# Patient Record
Sex: Female | Born: 1945
Health system: Southern US, Community
[De-identification: ages and names within clinical notes are randomized; demographics above are authoritative.]

## PROBLEM LIST (undated history)

## (undated) ENCOUNTER — Emergency Department: Admission: EM | Discharge status: Against Medical Advice | Payer: Self-pay

## (undated) DIAGNOSIS — R030 Elevated blood-pressure reading, without diagnosis of hypertension: Secondary | ICD-10-CM

## (undated) DIAGNOSIS — Z862 Personal history of diseases of the blood and blood-forming organs and certain disorders involving the immune mechanism: Secondary | ICD-10-CM

## (undated) DIAGNOSIS — Z800 Family history of malignant neoplasm of digestive organs: Secondary | ICD-10-CM

## (undated) DIAGNOSIS — E039 Hypothyroidism, unspecified: Secondary | ICD-10-CM

## (undated) DIAGNOSIS — H9190 Unspecified hearing loss, unspecified ear: Secondary | ICD-10-CM

## (undated) DIAGNOSIS — K5790 Diverticulosis of intestine, part unspecified, without perforation or abscess without bleeding: Secondary | ICD-10-CM

## (undated) DIAGNOSIS — E7800 Pure hypercholesterolemia, unspecified: Secondary | ICD-10-CM

## (undated) DIAGNOSIS — E785 Hyperlipidemia, unspecified: Secondary | ICD-10-CM

## (undated) DIAGNOSIS — Z8719 Personal history of other diseases of the digestive system: Secondary | ICD-10-CM

## (undated) DIAGNOSIS — Z1371 Encounter for nonprocreative screening for genetic disease carrier status: Secondary | ICD-10-CM

## (undated) DIAGNOSIS — Z803 Family history of malignant neoplasm of breast: Secondary | ICD-10-CM

## (undated) DIAGNOSIS — Z808 Family history of malignant neoplasm of other organs or systems: Secondary | ICD-10-CM

## (undated) HISTORY — DX: Family history of malignant neoplasm of breast: Z80.3

## (undated) HISTORY — PX: CHOLECYSTECTOMY: SHX55

## (undated) HISTORY — DX: Family history of malignant neoplasm of digestive organs: Z80.0

## (undated) HISTORY — PX: APPENDECTOMY: SHX54

## (undated) HISTORY — PX: FOOT SURGERY: SHX648

## (undated) HISTORY — DX: Family history of malignant neoplasm of other organs or systems: Z80.8

## (undated) HISTORY — PX: NASAL SEPTUM SURGERY: SHX37

## (undated) HISTORY — DX: Hypothyroidism, unspecified: E03.9

## (undated) HISTORY — PX: TONSILLECTOMY: SUR1361

## (undated) HISTORY — PX: BUNIONECTOMY: SHX129

## (undated) HISTORY — DX: Hyperlipidemia, unspecified: E78.5

## (undated) HISTORY — PX: CATARACT EXTRACTION: SUR2

---

## 1998-07-07 ENCOUNTER — Other Ambulatory Visit: Admission: RE | Admit: 1998-07-07 | Discharge: 1998-07-07 | Payer: Self-pay | Admitting: Obstetrics and Gynecology

## 1999-09-27 ENCOUNTER — Other Ambulatory Visit: Admission: RE | Admit: 1999-09-27 | Discharge: 1999-09-27 | Payer: Self-pay | Admitting: Obstetrics and Gynecology

## 2000-03-27 ENCOUNTER — Encounter: Payer: Self-pay | Admitting: Family Medicine

## 2000-03-27 ENCOUNTER — Ambulatory Visit (HOSPITAL_COMMUNITY): Admission: RE | Admit: 2000-03-27 | Discharge: 2000-03-27 | Payer: Self-pay | Admitting: Family Medicine

## 2000-06-11 ENCOUNTER — Encounter: Admission: RE | Admit: 2000-06-11 | Discharge: 2000-06-11 | Payer: Self-pay | Admitting: Family Medicine

## 2000-06-11 ENCOUNTER — Encounter: Payer: Self-pay | Admitting: Family Medicine

## 2001-06-03 ENCOUNTER — Other Ambulatory Visit: Admission: RE | Admit: 2001-06-03 | Discharge: 2001-06-03 | Payer: Self-pay | Admitting: Family Medicine

## 2001-06-20 ENCOUNTER — Encounter: Payer: Self-pay | Admitting: Family Medicine

## 2001-06-20 ENCOUNTER — Encounter: Admission: RE | Admit: 2001-06-20 | Discharge: 2001-06-20 | Payer: Self-pay | Admitting: Family Medicine

## 2002-06-09 ENCOUNTER — Other Ambulatory Visit: Admission: RE | Admit: 2002-06-09 | Discharge: 2002-06-09 | Payer: Self-pay | Admitting: Family Medicine

## 2002-06-23 ENCOUNTER — Encounter: Admission: RE | Admit: 2002-06-23 | Discharge: 2002-06-23 | Payer: Self-pay | Admitting: Family Medicine

## 2002-06-23 ENCOUNTER — Encounter: Payer: Self-pay | Admitting: Family Medicine

## 2003-06-24 ENCOUNTER — Encounter: Admission: RE | Admit: 2003-06-24 | Discharge: 2003-06-24 | Payer: Self-pay | Admitting: Family Medicine

## 2003-06-25 ENCOUNTER — Other Ambulatory Visit: Admission: RE | Admit: 2003-06-25 | Discharge: 2003-06-25 | Payer: Self-pay | Admitting: Family Medicine

## 2003-11-03 ENCOUNTER — Encounter: Admission: RE | Admit: 2003-11-03 | Discharge: 2003-11-03 | Payer: Self-pay | Admitting: Family Medicine

## 2004-01-18 ENCOUNTER — Encounter (INDEPENDENT_AMBULATORY_CARE_PROVIDER_SITE_OTHER): Payer: Self-pay | Admitting: Specialist

## 2004-01-18 ENCOUNTER — Ambulatory Visit (HOSPITAL_COMMUNITY): Admission: RE | Admit: 2004-01-18 | Discharge: 2004-01-18 | Payer: Self-pay | Admitting: *Deleted

## 2005-02-10 ENCOUNTER — Ambulatory Visit: Payer: Self-pay | Admitting: Internal Medicine

## 2005-03-07 ENCOUNTER — Ambulatory Visit: Payer: Self-pay

## 2005-03-15 ENCOUNTER — Encounter: Admission: RE | Admit: 2005-03-15 | Discharge: 2005-06-13 | Payer: Self-pay | Admitting: Internal Medicine

## 2005-04-03 ENCOUNTER — Encounter: Admission: RE | Admit: 2005-04-03 | Discharge: 2005-04-03 | Payer: Self-pay | Admitting: Family Medicine

## 2005-06-22 ENCOUNTER — Other Ambulatory Visit: Admission: RE | Admit: 2005-06-22 | Discharge: 2005-06-22 | Payer: Self-pay | Admitting: Family Medicine

## 2006-04-26 ENCOUNTER — Encounter: Admission: RE | Admit: 2006-04-26 | Discharge: 2006-04-26 | Payer: Self-pay | Admitting: Family Medicine

## 2008-06-16 ENCOUNTER — Encounter: Admission: RE | Admit: 2008-06-16 | Discharge: 2008-06-16 | Payer: Self-pay | Admitting: Family Medicine

## 2008-06-18 ENCOUNTER — Other Ambulatory Visit: Admission: RE | Admit: 2008-06-18 | Discharge: 2008-06-18 | Payer: Self-pay | Admitting: Family Medicine

## 2009-11-16 ENCOUNTER — Encounter: Admission: RE | Admit: 2009-11-16 | Discharge: 2009-11-16 | Payer: Self-pay | Admitting: Family Medicine

## 2009-11-26 ENCOUNTER — Encounter: Admission: RE | Admit: 2009-11-26 | Discharge: 2009-11-26 | Payer: Self-pay | Admitting: Family Medicine

## 2011-08-07 ENCOUNTER — Other Ambulatory Visit: Payer: Self-pay | Admitting: Family Medicine

## 2011-08-07 DIAGNOSIS — N644 Mastodynia: Secondary | ICD-10-CM

## 2011-08-09 ENCOUNTER — Ambulatory Visit
Admission: RE | Admit: 2011-08-09 | Discharge: 2011-08-09 | Disposition: A | Discharge status: Home / Self Care | Payer: 59 | Source: Ambulatory Visit | Attending: Family Medicine | Admitting: Family Medicine

## 2011-08-09 DIAGNOSIS — N644 Mastodynia: Secondary | ICD-10-CM

## 2011-11-19 ENCOUNTER — Emergency Department (HOSPITAL_BASED_OUTPATIENT_CLINIC_OR_DEPARTMENT_OTHER): Payer: 59

## 2011-11-19 ENCOUNTER — Encounter (HOSPITAL_BASED_OUTPATIENT_CLINIC_OR_DEPARTMENT_OTHER): Payer: Self-pay | Admitting: Emergency Medicine

## 2011-11-19 ENCOUNTER — Emergency Department (HOSPITAL_BASED_OUTPATIENT_CLINIC_OR_DEPARTMENT_OTHER)
Admission: EM | Admit: 2011-11-19 | Discharge: 2011-11-19 | Disposition: A | Discharge status: Home / Self Care | Payer: 59 | Attending: Emergency Medicine | Admitting: Emergency Medicine

## 2011-11-19 DIAGNOSIS — E039 Hypothyroidism, unspecified: Secondary | ICD-10-CM | POA: Insufficient documentation

## 2011-11-19 DIAGNOSIS — R911 Solitary pulmonary nodule: Secondary | ICD-10-CM | POA: Insufficient documentation

## 2011-11-19 DIAGNOSIS — K5732 Diverticulitis of large intestine without perforation or abscess without bleeding: Secondary | ICD-10-CM | POA: Insufficient documentation

## 2011-11-19 DIAGNOSIS — K5792 Diverticulitis of intestine, part unspecified, without perforation or abscess without bleeding: Secondary | ICD-10-CM

## 2011-11-19 DIAGNOSIS — E7800 Pure hypercholesterolemia, unspecified: Secondary | ICD-10-CM | POA: Insufficient documentation

## 2011-11-19 DIAGNOSIS — R110 Nausea: Secondary | ICD-10-CM | POA: Insufficient documentation

## 2011-11-19 HISTORY — DX: Hypothyroidism, unspecified: E03.9

## 2011-11-19 HISTORY — DX: Pure hypercholesterolemia, unspecified: E78.00

## 2011-11-19 HISTORY — DX: Diverticulosis of intestine, part unspecified, without perforation or abscess without bleeding: K57.90

## 2011-11-19 LAB — URINALYSIS, ROUTINE W REFLEX MICROSCOPIC
Bilirubin Urine: NEGATIVE
Glucose, UA: NEGATIVE mg/dL
Hgb urine dipstick: NEGATIVE
Ketones, ur: NEGATIVE mg/dL
Nitrite: NEGATIVE
Protein, ur: NEGATIVE mg/dL
Specific Gravity, Urine: 1.01 (ref 1.005–1.030)
Urobilinogen, UA: 0.2 mg/dL (ref 0.0–1.0)
pH: 8 (ref 5.0–8.0)

## 2011-11-19 LAB — CBC WITH DIFFERENTIAL/PLATELET
Basophils Absolute: 0 10*3/uL (ref 0.0–0.1)
Basophils Relative: 0 % (ref 0–1)
Eosinophils Absolute: 0 10*3/uL (ref 0.0–0.7)
Eosinophils Relative: 1 % (ref 0–5)
HCT: 37.4 % (ref 36.0–46.0)
Hemoglobin: 12.6 g/dL (ref 12.0–15.0)
Lymphocytes Relative: 13 % (ref 12–46)
Lymphs Abs: 1.1 10*3/uL (ref 0.7–4.0)
MCH: 31.1 pg (ref 26.0–34.0)
MCHC: 33.7 g/dL (ref 30.0–36.0)
MCV: 92.3 fL (ref 78.0–100.0)
Monocytes Absolute: 0.5 10*3/uL (ref 0.1–1.0)
Monocytes Relative: 5 % (ref 3–12)
Neutro Abs: 6.8 10*3/uL (ref 1.7–7.7)
Neutrophils Relative %: 81 % — ABNORMAL HIGH (ref 43–77)
Platelets: 185 10*3/uL (ref 150–400)
RBC: 4.05 MIL/uL (ref 3.87–5.11)
RDW: 14 % (ref 11.5–15.5)
WBC: 8.5 10*3/uL (ref 4.0–10.5)

## 2011-11-19 LAB — COMPREHENSIVE METABOLIC PANEL
ALT: 24 U/L (ref 0–35)
AST: 21 U/L (ref 0–37)
Albumin: 4 g/dL (ref 3.5–5.2)
Alkaline Phosphatase: 81 U/L (ref 39–117)
BUN: 12 mg/dL (ref 6–23)
CO2: 26 mEq/L (ref 19–32)
Calcium: 9.4 mg/dL (ref 8.4–10.5)
Chloride: 101 mEq/L (ref 96–112)
Creatinine, Ser: 0.9 mg/dL (ref 0.50–1.10)
GFR calc Af Amer: 77 mL/min — ABNORMAL LOW (ref >90)
GFR calc non Af Amer: 66 mL/min — ABNORMAL LOW (ref >90)
Glucose, Bld: 124 mg/dL — ABNORMAL HIGH (ref 70–99)
Potassium: 3.9 mEq/L (ref 3.5–5.1)
Sodium: 138 mEq/L (ref 135–145)
Total Bilirubin: 1 mg/dL (ref 0.3–1.2)
Total Protein: 7.4 g/dL (ref 6.0–8.3)

## 2011-11-19 LAB — URINE MICROSCOPIC-ADD ON

## 2011-11-19 MED ORDER — ONDANSETRON HCL 4 MG PO TABS: 4.0000 mg | ORAL_TABLET | Freq: Four times a day (QID) | ORAL | Status: DC

## 2011-11-19 MED ORDER — OXYCODONE-ACETAMINOPHEN 5-325 MG PO TABS
ORAL_TABLET | ORAL | Status: AC
  Administered 2011-11-19: 2
  Filled 2011-11-19: qty 2

## 2011-11-19 MED ORDER — OXYCODONE-ACETAMINOPHEN 5-325 MG PO TABS: 2.0000 | ORAL_TABLET | ORAL | Status: DC | PRN

## 2011-11-19 MED ORDER — IOHEXOL 300 MG/ML  SOLN
100.0000 mL | Freq: Once | INTRAMUSCULAR | Status: AC | PRN
  Administered 2011-11-19: 100 mL via INTRAVENOUS

## 2011-11-19 MED ORDER — IOHEXOL 300 MG/ML  SOLN: 25.0000 mL | INTRAMUSCULAR | Status: AC

## 2011-11-19 MED ORDER — SODIUM CHLORIDE 0.9 % IV BOLUS (SEPSIS)
500.0000 mL | Freq: Once | INTRAVENOUS | Status: AC
  Administered 2011-11-19: 500 mL via INTRAVENOUS

## 2011-11-19 MED ORDER — METRONIDAZOLE 500 MG PO TABS
500.0000 mg | ORAL_TABLET | Freq: Once | ORAL | Status: AC
  Administered 2011-11-19: 500 mg via ORAL
  Filled 2011-11-19: qty 1

## 2011-11-19 MED ORDER — ONDANSETRON HCL 8 MG PO TABS
ORAL_TABLET | ORAL | Status: AC
  Administered 2011-11-19: 8 mg
  Filled 2011-11-19: qty 1

## 2011-11-19 MED ORDER — CIPROFLOXACIN HCL 500 MG PO TABS: 500.0000 mg | ORAL_TABLET | Freq: Two times a day (BID) | ORAL | Status: DC

## 2011-11-19 MED ORDER — CIPROFLOXACIN HCL 500 MG PO TABS
500.0000 mg | ORAL_TABLET | Freq: Once | ORAL | Status: AC
  Administered 2011-11-19: 500 mg via ORAL
  Filled 2011-11-19: qty 1

## 2011-11-19 MED ORDER — ONDANSETRON HCL 4 MG/2ML IJ SOLN: 4.0000 mg | Freq: Once | INTRAMUSCULAR | Status: DC

## 2011-11-19 MED ORDER — MORPHINE SULFATE 4 MG/ML IJ SOLN: 4.0000 mg | Freq: Once | INTRAMUSCULAR | Status: DC

## 2011-11-19 MED ORDER — METRONIDAZOLE 500 MG PO TABS: 500.0000 mg | ORAL_TABLET | Freq: Two times a day (BID) | ORAL | Status: DC

## 2011-11-19 NOTE — ED Notes (Signed)
Generalized abdominal pain for three days, worse today.  Some nausea.  Zofran IM given per EMS.

## 2011-11-19 NOTE — ED Provider Notes (Signed)
History   This chart was scribed for Elizabeth Bucco, MD by Thad Ranger, ED Scribe. This patient was seen in room MH06/MH06 and the patient's care was started at 6:27 PM.   CSN: [id number]  Arrival date & time 11/19/11  1752   First MD Initiated Contact with Patient 11/19/11 1827      Chief Complaint  Patient presents with  . Abdominal Pain    The history is provided by the patient. No language interpreter was used.    Elizabeth Maldonado is a 66 y.o. female with a history of Diverticulosis, who was brought by EMS to the Emergency Department complaining of waxing and waning, sudden, sharp episodes of generalized abdominal pain onset 3 days ago. She reports that there is associated constant soreness, and nausea. She was given Zofran IM by EMS prior to arrival to ED and that seemed to alleviate the nausea. Pain doesn't radiate to any other parts of her body. Patient reports that she had small amount of diarrhea yesterday and a trace of it today. She denies fever, vomiting, dizziness, blood in stool or urine, and any other associated symptoms.     Past Medical History  Diagnosis Date  . Diverticulosis   . Hypercholesteremia   . Hypothyroid     Past Surgical History  Procedure Date  . Cholecystectomy   . Appendectomy   . Cesarean section   . Foot surgery   . Bunionectomy     No family history on file.  History  Substance Use Topics  . Smoking status: Not on file  . Smokeless tobacco: Not on file  . Alcohol Use:     No OB history provided.   Review of Systems  Constitutional: Negative for fever, chills, diaphoresis and fatigue.  HENT: Negative for congestion, rhinorrhea and sneezing.   Eyes: Negative.   Respiratory: Negative for cough, chest tightness and shortness of breath.   Cardiovascular: Negative for chest pain and leg swelling.  Gastrointestinal: Negative for nausea, vomiting, abdominal pain, diarrhea and blood in stool.  Genitourinary: Negative for frequency,  hematuria, flank pain and difficulty urinating.  Musculoskeletal: Negative for back pain and arthralgias.  Skin: Negative for rash.  Neurological: Negative for dizziness, speech difficulty, weakness, numbness and headaches.    Allergies  Crestor; Lipitor; and Vytorin  Home Medications   Current Outpatient Rx  Name  Route  Sig  Dispense  Refill  . EZETIMIBE 10 MG PO TABS   Oral   Take 10 mg by mouth daily.         Marland Kitchen LEVOTHYROXINE SODIUM 75 MCG PO TABS   Oral   Take 75 mcg by mouth daily.         Marland Kitchen NIACIN ER (ANTIHYPERLIPIDEMIC) 1000 MG PO TBCR   Oral   Take 1,000 mg by mouth at bedtime.         Marland Kitchen CIPROFLOXACIN HCL 500 MG PO TABS   Oral   Take 1 tablet (500 mg total) by mouth 2 (two) times daily.   20 tablet   0   . METRONIDAZOLE 500 MG PO TABS   Oral   Take 1 tablet (500 mg total) by mouth 2 (two) times daily.   14 tablet   0   . ONDANSETRON HCL 4 MG PO TABS   Oral   Take 1 tablet (4 mg total) by mouth every 6 (six) hours.   12 tablet   0   . OXYCODONE-ACETAMINOPHEN 5-325 MG PO TABS   Oral  Take 2 tablets by mouth every 4 (four) hours as needed for pain.   6 tablet   0     BP 160/67  Pulse 88  Temp 98.4 F (36.9 C) (Oral)  Resp 18  SpO2 98%  Physical Exam  Constitutional: She is oriented to person, place, and time. She appears well-developed and well-nourished.  HENT:  Head: Normocephalic and atraumatic.  Eyes: Pupils are equal, round, and reactive to light.  Neck: Normal range of motion. Neck supple.  Cardiovascular: Normal rate, regular rhythm and normal heart sounds.   Pulmonary/Chest: Effort normal and breath sounds normal. No respiratory distress. She has no wheezes. She has no rales. She exhibits no tenderness.  Abdominal: Soft. Bowel sounds are normal. There is no tenderness. There is no rebound and no guarding.       Moderate tenderness across the lower abdomin.   Musculoskeletal: Normal range of motion. She exhibits no edema.    Lymphadenopathy:    She has no cervical adenopathy.  Neurological: She is alert and oriented to person, place, and time.  Skin: Skin is warm and dry. No rash noted.  Psychiatric: She has a normal mood and affect.    ED Course  Procedures (including critical care time)  DIAGNOSTIC STUDIES: Oxygen Saturation is 98% on room air, adequate by my interpretation.    COORDINATION OF CARE: 6:37 PM Discussed treatment plan with pt at bedside and pt agreed to plan.  Results for orders placed during the hospital encounter of 11/19/11  CBC WITH DIFFERENTIAL      Component Value Range   WBC 8.5  4.0 - 10.5 K/uL   RBC 4.05  3.87 - 5.11 MIL/uL   Hemoglobin 12.6  12.0 - 15.0 g/dL   HCT 54.0  98.1 - 19.1 %   MCV 92.3  78.0 - 100.0 fL   MCH 31.1  26.0 - 34.0 pg   MCHC 33.7  30.0 - 36.0 g/dL   RDW 47.8  29.5 - 62.1 %   Platelets 185  150 - 400 K/uL   Neutrophils Relative 81 (*) 43 - 77 %   Neutro Abs 6.8  1.7 - 7.7 K/uL   Lymphocytes Relative 13  12 - 46 %   Lymphs Abs 1.1  0.7 - 4.0 K/uL   Monocytes Relative 5  3 - 12 %   Monocytes Absolute 0.5  0.1 - 1.0 K/uL   Eosinophils Relative 1  0 - 5 %   Eosinophils Absolute 0.0  0.0 - 0.7 K/uL   Basophils Relative 0  0 - 1 %   Basophils Absolute 0.0  0.0 - 0.1 K/uL  COMPREHENSIVE METABOLIC PANEL      Component Value Range   Sodium 138  135 - 145 mEq/L   Potassium 3.9  3.5 - 5.1 mEq/L   Chloride 101  96 - 112 mEq/L   CO2 26  19 - 32 mEq/L   Glucose, Bld 124 (*) 70 - 99 mg/dL   BUN 12  6 - 23 mg/dL   Creatinine, Ser 3.08  0.50 - 1.10 mg/dL   Calcium 9.4  8.4 - 65.7 mg/dL   Total Protein 7.4  6.0 - 8.3 g/dL   Albumin 4.0  3.5 - 5.2 g/dL   AST 21  0 - 37 U/L   ALT 24  0 - 35 U/L   Alkaline Phosphatase 81  39 - 117 U/L   Total Bilirubin 1.0  0.3 - 1.2 mg/dL   GFR calc non  Af Amer 66 (*) >90 mL/min   GFR calc Af Amer 77 (*) >90 mL/min  URINALYSIS, ROUTINE W REFLEX MICROSCOPIC      Component Value Range   Color, Urine YELLOW  YELLOW    APPearance CLEAR  CLEAR   Specific Gravity, Urine 1.010  1.005 - 1.030   pH 8.0  5.0 - 8.0   Glucose, UA NEGATIVE  NEGATIVE mg/dL   Hgb urine dipstick NEGATIVE  NEGATIVE   Bilirubin Urine NEGATIVE  NEGATIVE   Ketones, ur NEGATIVE  NEGATIVE mg/dL   Protein, ur NEGATIVE  NEGATIVE mg/dL   Urobilinogen, UA 0.2  0.0 - 1.0 mg/dL   Nitrite NEGATIVE  NEGATIVE   Leukocytes, UA TRACE (*) NEGATIVE  URINE MICROSCOPIC-ADD ON      Component Value Range   Squamous Epithelial / LPF RARE  RARE   WBC, UA 3-6  <3 WBC/hpf   Bacteria, UA RARE  RARE   Ct Abdomen Pelvis W Contrast  11/19/2011  *RADIOLOGY REPORT*  Clinical Data: Abdominal pain  CT ABDOMEN AND PELVIS WITH CONTRAST  Technique:  Multidetector CT imaging of the abdomen and pelvis was performed following the standard protocol during bolus administration of intravenous contrast.  Contrast: OMNIPAQUE IOHEXOL 300 MG/ML  SOLN  Comparison: 11/03/2003  Findings: Nonspecific bibasilar pleural thickening versus subpleural nodularity.  Heart size within normal limits.  No pleural or pericardial effusion.  Unremarkable liver, spleen, adrenal glands.  Absent gallbladder. No biliary ductal dilatation.  Pancreas within normal limits.  Symmetric renal enhancement.  No hydronephrosis or hydroureter. Duplicated collecting system on the right with ureteral fusion proximal to the UVJ.  Colonic diverticulosis with an inflamed segment of sigmoid colon demonstrating wall thickening and pericolonic fat stranding.  Small amount of fluid.  No loculated fluid collection.  No free intraperitoneal air.  No bowel obstruction.  No lymphadenopathy.  There is scattered atherosclerotic calcification of the aorta and its branches. No aneurysmal dilatation.  Thin-walled bladder.  Unremarkable CT appearance to the uterus and adnexa.  Sclerotic focus within the left iliac bone measures 1.8 cm. This has changed in the interval and had the appearance of a tiny bone island on the prior.   Multilevel degenerative changes and rightward curvature of the lumbar spine.  IMPRESSION: Findings are most in keeping with acute sigmoid colon diverticulitis. No abscess at this time.  Recommend colonoscopy follow-up to exclude an underlying mass.  1.8 cm left iliac bone sclerotic focus is nonspecific however has manifested since the prior.  Bone scan recommended.  Pleural versus subpleural nodularity along the lung bases posteriorly.  Consider non emergent chest CT follow-up to evaluate the lungs in their entirety.   Original Report Authenticated By: Jearld Lesch, M.D.       1. Diverticulitis   2. Lung nodule       MDM  Pt well appearing.  No evidence of abscess.  Will f/u with her PMD this week.  Started on cipro/flagyl.  Will return if symptoms worsen.  Advised need for f/u for possible lung nodule and for f/u colonscopy.      I personally performed the services described in this documentation, which was scribed in my presence.  The recorded information has been reviewed and considered.    Elizabeth Bucco, MD 11/19/11 [phone]

## 2011-11-19 NOTE — ED Notes (Signed)
Patient C/O abdominal pain for 2 days.  Pain begins in her bilateral flanks and shoots to her lower abdomen.   Also C/O intermittent back discomfort.  C/O having some diarrhea yesterday but none today.  Bowel sounds present X 4.  Lower abdomen is tender to palpation, especially on the left.  Denies dysuria.

## 2011-12-07 ENCOUNTER — Other Ambulatory Visit (HOSPITAL_COMMUNITY)
Admission: RE | Admit: 2011-12-07 | Discharge: 2011-12-07 | Disposition: A | Discharge status: Home / Self Care | Payer: 59 | Source: Ambulatory Visit | Attending: Family Medicine | Admitting: Family Medicine

## 2011-12-07 ENCOUNTER — Other Ambulatory Visit: Payer: Self-pay | Admitting: Family Medicine

## 2011-12-07 DIAGNOSIS — Z1151 Encounter for screening for human papillomavirus (HPV): Secondary | ICD-10-CM | POA: Insufficient documentation

## 2011-12-07 DIAGNOSIS — Z0000 Encounter for general adult medical examination without abnormal findings: Secondary | ICD-10-CM | POA: Insufficient documentation

## 2011-12-08 ENCOUNTER — Other Ambulatory Visit: Payer: Self-pay | Admitting: Family Medicine

## 2011-12-08 DIAGNOSIS — R911 Solitary pulmonary nodule: Secondary | ICD-10-CM

## 2011-12-11 ENCOUNTER — Ambulatory Visit
Admission: RE | Admit: 2011-12-11 | Discharge: 2011-12-11 | Disposition: A | Discharge status: Home / Self Care | Payer: 59 | Source: Ambulatory Visit | Attending: Family Medicine | Admitting: Family Medicine

## 2011-12-11 DIAGNOSIS — R911 Solitary pulmonary nodule: Secondary | ICD-10-CM

## 2011-12-11 MED ORDER — IOHEXOL 300 MG/ML  SOLN
75.0000 mL | Freq: Once | INTRAMUSCULAR | Status: AC | PRN
  Administered 2011-12-11: 75 mL via INTRAVENOUS

## 2012-04-12 ENCOUNTER — Other Ambulatory Visit: Payer: Self-pay | Admitting: Gastroenterology

## 2012-09-23 ENCOUNTER — Other Ambulatory Visit: Payer: Self-pay

## 2013-04-21 ENCOUNTER — Ambulatory Visit
Admission: RE | Admit: 2013-04-21 | Discharge: 2013-04-21 | Disposition: A | Discharge status: Home / Self Care | Payer: PRIVATE HEALTH INSURANCE | Source: Ambulatory Visit | Attending: Family Medicine | Admitting: Family Medicine

## 2013-04-21 ENCOUNTER — Other Ambulatory Visit: Payer: Self-pay | Admitting: Family Medicine

## 2013-04-21 DIAGNOSIS — M545 Low back pain, unspecified: Secondary | ICD-10-CM

## 2015-05-17 ENCOUNTER — Emergency Department (HOSPITAL_BASED_OUTPATIENT_CLINIC_OR_DEPARTMENT_OTHER): Payer: Medicare Other

## 2015-05-17 ENCOUNTER — Encounter (HOSPITAL_BASED_OUTPATIENT_CLINIC_OR_DEPARTMENT_OTHER): Payer: Self-pay | Admitting: *Deleted

## 2015-05-17 ENCOUNTER — Emergency Department (HOSPITAL_BASED_OUTPATIENT_CLINIC_OR_DEPARTMENT_OTHER)
Admission: EM | Admit: 2015-05-17 | Discharge: 2015-05-17 | Disposition: A | Discharge status: Home / Self Care | Payer: Medicare Other | Attending: Emergency Medicine | Admitting: Emergency Medicine

## 2015-05-17 DIAGNOSIS — R1084 Generalized abdominal pain: Secondary | ICD-10-CM | POA: Diagnosis present

## 2015-05-17 DIAGNOSIS — Z79899 Other long term (current) drug therapy: Secondary | ICD-10-CM | POA: Diagnosis not present

## 2015-05-17 DIAGNOSIS — K5732 Diverticulitis of large intestine without perforation or abscess without bleeding: Secondary | ICD-10-CM | POA: Insufficient documentation

## 2015-05-17 DIAGNOSIS — E7800 Pure hypercholesterolemia, unspecified: Secondary | ICD-10-CM | POA: Insufficient documentation

## 2015-05-17 DIAGNOSIS — E039 Hypothyroidism, unspecified: Secondary | ICD-10-CM | POA: Insufficient documentation

## 2015-05-17 LAB — URINALYSIS, ROUTINE W REFLEX MICROSCOPIC
Bilirubin Urine: NEGATIVE
Glucose, UA: NEGATIVE mg/dL
Hgb urine dipstick: NEGATIVE
Ketones, ur: 15 mg/dL — AB
Nitrite: NEGATIVE
Protein, ur: NEGATIVE mg/dL
Specific Gravity, Urine: 1.017 (ref 1.005–1.030)
pH: 6 (ref 5.0–8.0)

## 2015-05-17 LAB — I-STAT CG4 LACTIC ACID, ED: Lactic Acid, Venous: 1.39 mmol/L (ref 0.5–2.0)

## 2015-05-17 LAB — URINE MICROSCOPIC-ADD ON

## 2015-05-17 LAB — COMPREHENSIVE METABOLIC PANEL
ALT: 18 U/L (ref 14–54)
AST: 23 U/L (ref 15–41)
Albumin: 3.8 g/dL (ref 3.5–5.0)
Alkaline Phosphatase: 64 U/L (ref 38–126)
Anion gap: 7 (ref 5–15)
BUN: 11 mg/dL (ref 6–20)
CO2: 27 mmol/L (ref 22–32)
Calcium: 9.1 mg/dL (ref 8.9–10.3)
Chloride: 106 mmol/L (ref 101–111)
Creatinine, Ser: 0.93 mg/dL (ref 0.44–1.00)
GFR calc Af Amer: >60 mL/min (ref >60)
GFR calc non Af Amer: >60 mL/min (ref >60)
Glucose, Bld: 109 mg/dL — ABNORMAL HIGH (ref 65–99)
Potassium: 4.1 mmol/L (ref 3.5–5.1)
Sodium: 140 mmol/L (ref 135–145)
Total Bilirubin: 1 mg/dL (ref 0.3–1.2)
Total Protein: 7.4 g/dL (ref 6.5–8.1)

## 2015-05-17 LAB — CBC WITH DIFFERENTIAL/PLATELET
Basophils Absolute: 0 10*3/uL (ref 0.0–0.1)
Basophils Relative: 0 %
Eosinophils Absolute: 0.1 10*3/uL (ref 0.0–0.7)
Eosinophils Relative: 2 %
HCT: 41.6 % (ref 36.0–46.0)
Hemoglobin: 14.1 g/dL (ref 12.0–15.0)
Lymphocytes Relative: 43 %
Lymphs Abs: 1.8 10*3/uL (ref 0.7–4.0)
MCH: 31.5 pg (ref 26.0–34.0)
MCHC: 33.9 g/dL (ref 30.0–36.0)
MCV: 93.1 fL (ref 78.0–100.0)
Monocytes Absolute: 0.3 10*3/uL (ref 0.1–1.0)
Monocytes Relative: 8 %
Neutro Abs: 2 10*3/uL (ref 1.7–7.7)
Neutrophils Relative %: 47 %
Platelets: 209 10*3/uL (ref 150–400)
RBC: 4.47 MIL/uL (ref 3.87–5.11)
RDW: 13.8 % (ref 11.5–15.5)
WBC: 4.2 10*3/uL (ref 4.0–10.5)

## 2015-05-17 LAB — LIPASE, BLOOD: Lipase: 27 U/L (ref 11–51)

## 2015-05-17 MED ORDER — OXYCODONE HCL 5 MG PO TABS: 5.0000 mg | ORAL_TABLET | Freq: Four times a day (QID) | ORAL | Status: DC | PRN

## 2015-05-17 MED ORDER — IOPAMIDOL (ISOVUE-300) INJECTION 61%
100.0000 mL | Freq: Once | INTRAVENOUS | Status: AC | PRN
  Administered 2015-05-17: 100 mL via INTRAVENOUS

## 2015-05-17 MED ORDER — HYDROCODONE-ACETAMINOPHEN 5-325 MG PO TABS: 1.0000 | ORAL_TABLET | Freq: Four times a day (QID) | ORAL | Status: DC | PRN

## 2015-05-17 MED FILL — oxyCODONE HCL 5 MG TABS: 5 | 2 days supply | Qty: 8 | Fill #0

## 2015-05-17 NOTE — ED Notes (Signed)
N/v/d since Wednesday. Hx of c-diff and diverticulitis. Started cipro and flagyl on Friday. States pain is still there. Reports no vomiting and 2 episodes of loose stool in the 24 hours

## 2015-05-17 NOTE — ED Notes (Signed)
Pt drinking water for po challenge per [facility] from Dr. Rex Kras

## 2015-05-17 NOTE — ED Notes (Signed)
Pt directed to pharmacy to pick up prescriptions. Ambulatory with steady gait to d/c window

## 2015-05-17 NOTE — Discharge Instructions (Signed)
Diverticulitis °Diverticulitis is inflammation or infection of small pouches in your colon that form when you have a condition called diverticulosis. The pouches in your colon are called diverticula. Your colon, or large intestine, is where water is absorbed and stool is formed. °Complications of diverticulitis can include: °· Bleeding. °· Severe infection. °· Severe pain. °· Perforation of your colon. °· Obstruction of your colon. °CAUSES  °Diverticulitis is caused by bacteria. °Diverticulitis happens when stool becomes trapped in diverticula. This allows bacteria to grow in the diverticula, which can lead to inflammation and infection. °RISK FACTORS °People with diverticulosis are at risk for diverticulitis. Eating a diet that does not include enough fiber from fruits and vegetables may make diverticulitis more likely to develop. °SYMPTOMS  °Symptoms of diverticulitis may include: °· Abdominal pain and tenderness. The pain is normally located on the left side of the abdomen, but may occur in other areas. °· Fever and chills. °· Bloating. °· Cramping. °· Nausea. °· Vomiting. °· Constipation. °· Diarrhea. °· Blood in your stool. °DIAGNOSIS  °Your health care provider will ask you about your medical history and do a physical exam. You may need to have tests done because many medical conditions can cause the same symptoms as diverticulitis. Tests may include: °· Blood tests. °· Urine tests. °· Imaging tests of the abdomen, including X-rays and CT scans. °When your condition is under control, your health care provider may recommend that you have a colonoscopy. A colonoscopy can show how severe your diverticula are and whether something else is causing your symptoms. °TREATMENT  °Most cases of diverticulitis are mild and can be treated at home. Treatment may include: °· Taking over-the-counter pain medicines. °· Following a clear liquid diet. °· Taking antibiotic medicines by mouth for 7-10 days. °More severe cases may  be treated at a hospital. Treatment may include: °· Not eating or drinking. °· Taking prescription pain medicine. °· Receiving antibiotic medicines through an IV tube. °· Receiving fluids and nutrition through an IV tube. °· Surgery. °HOME CARE INSTRUCTIONS  °· Follow your health care provider's instructions carefully. °· Follow a full liquid diet or other diet as directed by your health care provider. After your symptoms improve, your health care provider may tell you to change your diet. He or she may recommend you eat a high-fiber diet. Fruits and vegetables are good sources of fiber. Fiber makes it easier to pass stool. °· Take fiber supplements or probiotics as directed by your health care provider. °· Only take medicines as directed by your health care provider. °· Keep all your follow-up appointments. °SEEK MEDICAL CARE IF:  °· Your pain does not improve. °· You have a hard time eating food. °· Your bowel movements do not return to normal. °SEEK IMMEDIATE MEDICAL CARE IF:  °· Your pain becomes worse. °· Your symptoms do not get better. °· Your symptoms suddenly get worse. °· You have a fever. °· You have repeated vomiting. °· You have bloody or black, tarry stools. °MAKE SURE YOU:  °· Understand these instructions. °· Will watch your condition. °· Will get help right away if you are not doing well or get worse. °  °This information is not intended to replace advice given to you by your health care provider. Make sure you discuss any questions you have with your health care provider. °  °Document Released: 09/28/2004 Document Revised: 12/24/2012 Document Reviewed: 11/13/2012 °Elsevier Interactive Patient Education ©2016 [employer] ° °

## 2015-05-17 NOTE — ED Notes (Addendum)
Dr. Rex Kras notified that pt has tylenol allergy and cannot taken vicodin.

## 2015-05-17 NOTE — ED Notes (Signed)
Patient transported to CT 

## 2015-05-17 NOTE — ED Provider Notes (Signed)
CSN: [id number]     Arrival date & time 05/17/15  0756 History   First MD Initiated Contact with Patient 05/17/15 [phone]     Chief Complaint  Patient presents with  . Abdominal Pain     (Consider location/radiation/quality/duration/timing/severity/associated sxs/prior Treatment) HPI Comments: 70 year old female with past medical history including HLD, hypothyroidism, diverticulitis, C. difficile who presents with abdominal pain, vomiting, and diarrhea. Patient states that 5 days ago, she began having vomiting associated with nonbloody diarrhea. She saw her PCP 3 days ago and was started on Cipro and Flagyl which she has been taking. She reports that she has not had any vomiting and only 2 episodes of loose stool in the past 24 hours, however her abdominal pain has been persistent. The pain is currently 4/10 in intensity. The abdominal pain was initially sharp and in her right lower quadrant but is now diffuse across her abdomen. Eating makes the abdominal pain worse. She reports dark urine but denies any dysuria. No fevers. She denies any recent antibiotic use. Her last episode of diverticulitis was approximately 4 years ago and it is been a long time since she has had problems with C. difficile.  Patient is a 70 y.o. female presenting with abdominal pain. The history is provided by the patient.  Abdominal Pain   Past Medical History  Diagnosis Date  . Diverticulosis   . Hypercholesteremia   . Hypothyroid    Past Surgical History  Procedure Laterality Date  . Cholecystectomy    . Appendectomy    . Cesarean section    . Foot surgery    . Bunionectomy    . Cataract extraction    . Tonsillectomy     No family history on file. Social History  Substance Use Topics  . Smoking status: Never Smoker   . Smokeless tobacco: Never Used  . Alcohol Use: No   OB History    No data available     Review of Systems  Gastrointestinal: Positive for abdominal pain.   10 Systems reviewed and  are negative for acute change except as noted in the HPI.    Allergies  Crestor; Lipitor; and Vytorin  Home Medications   Prior to Admission medications   Medication Sig Start Date End Date Taking? Authorizing Provider  levothyroxine (SYNTHROID, LEVOTHROID) 75 MCG tablet Take 75 mcg by mouth daily.   Yes Historical Provider, MD  ciprofloxacin (CIPRO) 500 MG tablet Take 1 tablet (500 mg total) by mouth 2 (two) times daily. 11/19/11   Malvin Johns, MD  ezetimibe (ZETIA) 10 MG tablet Take 10 mg by mouth daily.    Historical Provider, MD  HYDROcodone-acetaminophen (NORCO/VICODIN) 5-325 MG tablet Take 1-2 tablets by mouth every 6 (six) hours as needed for severe pain. 05/17/15   Sharlett Iles, MD  metroNIDAZOLE (FLAGYL) 500 MG tablet Take 1 tablet (500 mg total) by mouth 2 (two) times daily. 11/19/11   Malvin Johns, MD  niacin (NIASPAN) 1000 MG CR tablet Take 1,000 mg by mouth at bedtime.    Historical Provider, MD  ondansetron (ZOFRAN) 4 MG tablet Take 1 tablet (4 mg total) by mouth every 6 (six) hours. 11/19/11   Malvin Johns, MD   BP 124/64 mmHg  Pulse 56  Temp(Src) 97.9 F (36.6 C) (Oral)  Resp 18  Ht 5' 2.5" (1.588 m)  Wt 204 lb (92.534 kg)  BMI 36.69 kg/m2  SpO2 100% Physical Exam  Constitutional: She is oriented to person, place, and time. She appears well-developed and  well-nourished. No distress.  HENT:  Head: Normocephalic and atraumatic.  dry mucous membranes  Eyes: Conjunctivae are normal. Pupils are equal, round, and reactive to light.  Neck: Neck supple.  Cardiovascular: Normal rate, regular rhythm and normal heart sounds.   No murmur heard. Pulmonary/Chest: Effort normal and breath sounds normal.  Abdominal: Soft. Bowel sounds are normal. She exhibits no distension. There is tenderness. There is no rebound and no guarding.  Generalized TTP, worst in midepigastrium, RLQ and LLQ, no peritonitis  Musculoskeletal: She exhibits no edema.  Neurological: She is  alert and oriented to person, place, and time.  Fluent speech  Skin: Skin is warm and dry.  Psychiatric: She has a normal mood and affect. Judgment normal.  Nursing note and vitals reviewed.   ED Course  Procedures (including critical care time) Labs Review Labs Reviewed  COMPREHENSIVE METABOLIC PANEL - Abnormal; Notable for the following:    Glucose, Bld 109 (*)    All other components within normal limits  URINALYSIS, ROUTINE W REFLEX MICROSCOPIC (NOT AT [facility]) - Abnormal; Notable for the following:    Color, Urine AMBER (*)    APPearance CLOUDY (*)    Ketones, ur 15 (*)    Leukocytes, UA MODERATE (*)    All other components within normal limits  URINE MICROSCOPIC-ADD ON - Abnormal; Notable for the following:    Squamous Epithelial / LPF 0-5 (*)    Bacteria, UA MANY (*)    All other components within normal limits  URINE CULTURE  CBC WITH DIFFERENTIAL/PLATELET  LIPASE, BLOOD  I-STAT CG4 LACTIC ACID, ED    Imaging Review Ct Abdomen Pelvis W Contrast  05/17/2015  CLINICAL DATA:  Lower abdominal pain with nausea, vomiting, and diarrhea. EXAM: CT ABDOMEN AND PELVIS WITH CONTRAST TECHNIQUE: Multidetector CT imaging of the abdomen and pelvis was performed using the standard protocol following bolus administration of intravenous contrast. CONTRAST:  151mL ISOVUE-300 IOPAMIDOL (ISOVUE-300) INJECTION 61% COMPARISON:  CT scan dated 11/19/2011 FINDINGS: Lower chest:  Normal. Hepatobiliary: Gallbladder has been removed. 4 mm cyst in the posterior aspect of the left lobe of the liver. Liver parenchyma is otherwise normal. Bile ducts are normal. Pancreas: Normal. Spleen: Normal. Adrenals/Urinary Tract: Normal except for a duplicated right renal collecting system. Stomach/Bowel: Diverticulosis of the descending and sigmoid portions of the colon. Single inflamed diverticulum and proximal sigmoid colon best seen on image 62 of series 2 and images 33 and 34 of series 5. There are a few diverticula in  the distal ileum.Small hiatal hernia. Vascular/Lymphatic: There is minimal calcification in the abdominal aorta. No adenopathy. Reproductive: Normal. Other: No free air or free fluid. Musculoskeletal: No acute abnormality. Multilevel degenerative disc and joint disease in the lumbar spine with slight lumbar scoliosis. IMPRESSION: 1. Diverticulosis of left-sided colon with a single inflamed diverticulum in the proximal sigmoid colon. 2. Duplicated right renal collecting system. Electronically Signed   By: Lorriane Shire M.D.   On: 05/17/2015 11:18   I have personally reviewed and evaluated these lab results as part of my medical decision-making.   EKG Interpretation None     Medications  iopamidol (ISOVUE-300) 61 % injection 100 mL (100 mLs Intravenous Contrast Given 05/17/15 1051)    MDM   Final diagnoses:  Diverticulitis of large intestine without perforation or abscess without bleeding    Pt Presents with 5 days of abdominal cramping associated with vomiting and diarrhea, symptoms have improved on Cipro and Flagyl however abdominal pain has persisted. Patient was well-appearing with  reassuring vital signs, afebrile. She had generalized tenderness which was worst in the midepigastrium, right lower quadrant and left lower quadrant and became tearful on abdominal exam. No distention or peritonitis. Obtained above lab work as well as CT scan to evaluate for complications of diverticulitis versus other acute process.  Labwork unremarkable. UA shows moderate leukocytes, some bacteria but also some squamous cells. Urine culture sent, patient denies any urinary symptoms. CT showed diverticulosis with 1 single inflamed diverticulum, no other complications. On reexamination, the patient was comfortable and well-appearing. Her pain was well controlled and she was able to tolerate water. Because she reports improvement of the vomiting and diarrhea, I feel she is appropriate for outpatient management with  continuation of her current prescriptions for Cipro and Flagyl. Provided her with Vicodin to use as needed for moderate to severe pain. Discussed follow-up with PCP as well as return precautions including any worsening pain, fevers, or new symptoms. Patient voiced understanding and was discharged in satisfactory condition.  Sharlett Iles, MD 05/17/15 1239

## 2015-05-17 NOTE — ED Notes (Signed)
Pt drinking oral contrast for CT. Made aware of need for additional urine sample to run urine culture

## 2015-05-17 NOTE — ED Notes (Signed)
MD at bedside. 

## 2015-05-18 LAB — URINE CULTURE
Culture: NO GROWTH
Special Requests: NORMAL

## 2015-12-02 ENCOUNTER — Emergency Department (INDEPENDENT_AMBULATORY_CARE_PROVIDER_SITE_OTHER): Payer: Medicare Other

## 2015-12-02 ENCOUNTER — Encounter: Payer: Self-pay | Admitting: *Deleted

## 2015-12-02 ENCOUNTER — Emergency Department
Admission: EM | Admit: 2015-12-02 | Discharge: 2015-12-02 | Disposition: A | Discharge status: Home / Self Care | Payer: Medicare Other | Source: Home / Self Care | Attending: Family Medicine | Admitting: Family Medicine

## 2015-12-02 DIAGNOSIS — M25561 Pain in right knee: Secondary | ICD-10-CM

## 2015-12-02 DIAGNOSIS — S8391XA Sprain of unspecified site of right knee, initial encounter: Secondary | ICD-10-CM | POA: Diagnosis not present

## 2015-12-02 MED ORDER — IBUPROFEN 600 MG PO TABS
600.0000 mg | ORAL_TABLET | Freq: Once | ORAL | Status: AC
  Administered 2015-12-02: 600 mg via ORAL

## 2015-12-02 NOTE — ED Triage Notes (Signed)
This AM patient reports twisting and feeling sudden pain in her right knee and falling to the floor. No previous injuries.

## 2015-12-02 NOTE — ED Provider Notes (Signed)
[facility]    CSN: [id number] Arrival date & time: 12/02/15  [id number]     History   Chief Complaint Chief Complaint  Patient presents with  . Knee Injury    HPI Elizabeth Maldonado is a 70 y.o. female.   At 6:30 this morning patient twisted on her right knee and it suddenly gave way.  She fell to the floor but suffered no other injury.  She notes that her right knee has been occasionally mildly unstable during the past three months, but not painful.   The history is provided by the patient.  Knee Pain  Location:  Knee Time since incident:  2 hours Injury: yes   Mechanism of injury: fall   Fall:    Fall occurred: while standing at home.   Impact surface:  Hard floor Knee location:  R knee Pain details:    Quality:  Aching   Radiates to:  Does not radiate   Severity:  Severe   Onset quality:  Sudden   Duration:  2 hours   Timing:  Constant   Progression:  Unchanged Chronicity:  New Dislocation: no   Prior injury to area:  No Relieved by:  None tried Worsened by:  Bearing weight Ineffective treatments:  None tried Associated symptoms: decreased ROM, stiffness and swelling   Associated symptoms: no back pain, no muscle weakness, no numbness and no tingling   Risk factors: obesity     Past Medical History:  Diagnosis Date  . Diverticulosis   . Hypercholesteremia   . Hypothyroid     There are no active problems to display for this patient.   Past Surgical History:  Procedure Laterality Date  . APPENDECTOMY    . BUNIONECTOMY    . CATARACT EXTRACTION    . CESAREAN SECTION    . CHOLECYSTECTOMY    . FOOT SURGERY    . TONSILLECTOMY      OB History    No data available       Home Medications    Prior to Admission medications   Medication Sig Start Date End Date Taking? Authorizing Provider  levothyroxine (SYNTHROID, LEVOTHROID) 75 MCG tablet Take 75 mcg by mouth daily.   Yes Historical Provider, MD  pravastatin (PRAVACHOL) 20 MG tablet Take 20  mg by mouth daily.   Yes Historical Provider, MD  niacin (NIASPAN) 1000 MG CR tablet Take 1,000 mg by mouth at bedtime.    Historical Provider, MD    Family History History reviewed. No pertinent family history.  Social History Social History  Substance Use Topics  . Smoking status: Never Smoker  . Smokeless tobacco: Never Used  . Alcohol use No     Allergies   Acetaminophen; Crestor [rosuvastatin]; Lipitor [atorvastatin]; and Vytorin [ezetimibe-simvastatin]   Review of Systems Review of Systems  Musculoskeletal: Positive for stiffness. Negative for back pain.  All other systems reviewed and are negative.    Physical Exam Triage Vital Signs ED Triage Vitals [12/02/15 0903]  Enc Vitals Group     BP 157/69     Pulse Rate 79     Resp      Temp      Temp src      SpO2 97 %     Weight 190 lb (86.2 kg)     Height      Head Circumference      Peak Flow      Pain Score      Pain Loc  Pain Edu?      Excl. in [place]?    No data found.   Updated Vital Signs BP 157/69 (BP Location: Left Arm)   Pulse 79   Wt 190 lb (86.2 kg)   SpO2 97%   BMI 34.20 kg/m   Visual Acuity Right Eye Distance:   Left Eye Distance:   Bilateral Distance:    Right Eye Near:   Left Eye Near:    Bilateral Near:     Physical Exam  Constitutional: She appears well-developed and well-nourished. No distress.  HENT:  Head: Atraumatic.  Eyes: Pupils are equal, round, and reactive to light.  Cardiovascular: Normal rate.   Pulmonary/Chest: Effort normal.  Musculoskeletal:       Right knee: She exhibits decreased range of motion and bony tenderness. She exhibits no swelling, no ecchymosis, no deformity, no laceration, no erythema, normal alignment and no LCL laxity. Tenderness found. Medial joint line tenderness noted. No patellar tendon tenderness noted.       Legs: Right knee:  No effusion, erythema, or warmth.  Knee stable, negative drawer test.  Patient has pain with full extension and  flexion.  Vague tenderness laterally.  Unable to perform McMurray test because of her pain.  Distal neurovascular function is intact.   Neurological: She is alert.  Skin: Skin is warm and dry.  Nursing note and vitals reviewed.    UC Treatments / Results  Labs (all labs ordered are listed, but only abnormal results are displayed) Labs Reviewed - No data to display  EKG  EKG Interpretation None       Radiology No results found.  Procedures Procedures (including critical care time)  Medications Ordered in UC Medications  ibuprofen (ADVIL,MOTRIN) tablet 600 mg (600 mg Oral Given 12/02/15 0909)     Initial Impression / Assessment and Plan / UC Course  I have reviewed the triage vital signs and the nursing notes.  Pertinent labs & imaging results that were available during my care of the patient were reviewed by me and considered in my medical decision making (see chart for details).  Clinical Course   Hinged knee brace applied. Apply ice pack for 30 minutes every 1 to 2 hours today and tomorrow.  Elevate.  Use crutches or walker for 3 to 5 days.  Wear brace for about 1 to 2 weeks.  Begin knee exercises as tolerated.  May take Ibuprofen 200mg , 4 tabs every 8 hours with food.  Followup with Dr. Aundria Mems or Dr. Lynne Leader ([facility]).    Final Clinical Impressions(s) / UC Diagnoses   Final diagnoses:  Sprain of right knee, unspecified ligament, initial encounter    New Prescriptions Discharge Medication List as of 12/02/2015  9:58 AM       Kandra Nicolas, MD 12/09/15 1201

## 2015-12-02 NOTE — Discharge Instructions (Signed)
Apply ice pack for 30 minutes every 1 to 2 hours today and tomorrow.  Elevate.  Use crutches or walker for 3 to 5 days.  Wear brace for about 1 to 2 weeks.  Begin knee exercises as tolerated.  May take Ibuprofen 200mg , 4 tabs every 8 hours with food.

## 2015-12-16 ENCOUNTER — Ambulatory Visit (INDEPENDENT_AMBULATORY_CARE_PROVIDER_SITE_OTHER): Payer: Medicare Other | Admitting: Podiatry

## 2015-12-16 ENCOUNTER — Encounter: Payer: Self-pay | Admitting: Podiatry

## 2015-12-16 DIAGNOSIS — M722 Plantar fascial fibromatosis: Secondary | ICD-10-CM

## 2015-12-16 DIAGNOSIS — M216X1 Other acquired deformities of right foot: Secondary | ICD-10-CM

## 2015-12-16 DIAGNOSIS — M21961 Unspecified acquired deformity of right lower leg: Secondary | ICD-10-CM | POA: Diagnosis not present

## 2015-12-16 DIAGNOSIS — M216X9 Other acquired deformities of unspecified foot: Secondary | ICD-10-CM | POA: Insufficient documentation

## 2015-12-16 NOTE — Progress Notes (Signed)
SUBJECTIVE: 70 y.o. year old female presents requesting a new brace to replace old one that was made in 2011. Stated that her foot has changed and instep area is too flat to accommodate her high arch. Ritch brace has done well supporting her ankle and arch. Did well till now. Started to have problem last year with pain in instep with lateral column pain shooting to lateral leg right foot.  Patient is also using Custom orthotics that were made before her foot surgery by [facility].  HPI: Cotton osteotomy with bone graft and shortening of 2nd metatarsal right foot in 2011.  REVIEW OF SYSTEMS: Pertinent items noted in HPI and remainder of comprehensive ROS otherwise negative.  OBJECTIVE: DERMATOLOGIC EXAMINATION: Normal findings.   VASCULAR EXAMINATION OF LOWER LIMBS: All pedal pulses are palpable with normal pulsation.  Capillary Filling times within 3 seconds in all digits.  No edema or erythema noted. Temperature gradient from tibial crest to dorsum of foot is within normal bilateral.  NEUROLOGIC EXAMINATION OF THE LOWER LIMBS: All epicritic and tactile sensations grossly intact.   MUSCULOSKELETAL EXAMINATION: Positive for excess first ray elevation. Increased Ankle and STJ pronation with weight bearing, symptomatic.   ASSESSMENT: Plantar fasciitis right. Lesser metatarsalgia right. Tenosynovitis right ankle with hyperpronation. S/P Cotton osteotomy with bone graft, shortening 2nd metatarsal right foot 2011.  PLAN: Reviewed findings and available treatment options. OTC Hard shell orthotics dispensed to address her high instep problem. If added orthotics don't do the job, we will order a new Ritch brace. Return if we need to order a new brace.

## 2015-12-16 NOTE — Patient Instructions (Signed)
Seen for right foot evaluation to order a new Ritch brace.  Will try hard shell OTC orthotics to use with old brace for a while. Will give quot on the cost of new brace.

## 2015-12-22 ENCOUNTER — Ambulatory Visit (INDEPENDENT_AMBULATORY_CARE_PROVIDER_SITE_OTHER): Payer: Medicare Other | Admitting: Podiatry

## 2015-12-22 ENCOUNTER — Encounter: Payer: Self-pay | Admitting: Podiatry

## 2015-12-22 DIAGNOSIS — M216X1 Other acquired deformities of right foot: Secondary | ICD-10-CM | POA: Diagnosis not present

## 2015-12-22 DIAGNOSIS — M21961 Unspecified acquired deformity of right lower leg: Secondary | ICD-10-CM

## 2015-12-22 DIAGNOSIS — M722 Plantar fascial fibromatosis: Secondary | ICD-10-CM | POA: Diagnosis not present

## 2015-12-22 NOTE — Patient Instructions (Signed)
Impression taken for a new Ritch brace on right lower limb.

## 2015-12-22 NOTE — Progress Notes (Signed)
SUBJECTIVE: 70 y.o. year old female presents to prepare for a new brace. She was tried with temporary inserts to raise her arch. She is still having too much discomfort and wish to have a new brace.  HPI: Cotton osteotomy with bone graft and shortening of 2nd metatarsal right foot in 2011.   OBJECTIVE: DERMATOLOGIC EXAMINATION: Normal findings.   VASCULAR EXAMINATION OF LOWER LIMBS: All pedal pulses are palpable with normal pulsation.  Capillary Filling times within 3 seconds in all digits.  No edema or erythema noted. Temperature gradient from tibial crest to dorsum of foot is within normal bilateral.  NEUROLOGIC EXAMINATION OF THE LOWER LIMBS: All epicritic and tactile sensations grossly intact.   MUSCULOSKELETAL EXAMINATION: Positive for excess first ray elevation. Increased Ankle and STJ pronation with weight bearing, symptomatic.   ASSESSMENT: Plantar fasciitis right. Lesser metatarsalgia right. Tenosynovitis right ankle with hyperpronation. S/P Cotton osteotomy with bone graft, shortening 2nd metatarsal right foot 2011  PLAN: Right lower limb impression taken for a new Ritch brace.

## 2016-01-13 ENCOUNTER — Other Ambulatory Visit: Payer: Self-pay | Admitting: Family Medicine

## 2016-01-13 ENCOUNTER — Other Ambulatory Visit (HOSPITAL_COMMUNITY)
Admission: RE | Admit: 2016-01-13 | Discharge: 2016-01-13 | Disposition: A | Discharge status: Home / Self Care | Payer: Medicare Other | Source: Ambulatory Visit | Attending: Family Medicine | Admitting: Family Medicine

## 2016-01-13 DIAGNOSIS — Z124 Encounter for screening for malignant neoplasm of cervix: Secondary | ICD-10-CM | POA: Diagnosis present

## 2016-01-14 LAB — CYTOLOGY - PAP: Diagnosis: NEGATIVE

## 2016-02-01 ENCOUNTER — Telehealth: Payer: Self-pay

## 2016-02-01 NOTE — Telephone Encounter (Signed)
SENT NOTES TO SCHEDULING 

## 2016-02-03 ENCOUNTER — Telehealth: Payer: Self-pay | Admitting: Cardiovascular Disease

## 2016-02-03 NOTE — Telephone Encounter (Signed)
Received records from [facility] for appointment on 02/15/16 with Dr Oval Linsey.  Records put with Dr Blenda Mounts schedule on 02/15/16. lp

## 2016-02-15 ENCOUNTER — Ambulatory Visit (INDEPENDENT_AMBULATORY_CARE_PROVIDER_SITE_OTHER): Payer: Medicare Other | Admitting: Cardiovascular Disease

## 2016-02-15 ENCOUNTER — Encounter: Payer: Self-pay | Admitting: Cardiovascular Disease

## 2016-02-15 DIAGNOSIS — E038 Other specified hypothyroidism: Secondary | ICD-10-CM

## 2016-02-15 DIAGNOSIS — E7800 Pure hypercholesterolemia, unspecified: Secondary | ICD-10-CM | POA: Diagnosis not present

## 2016-02-15 NOTE — Progress Notes (Signed)
Cardiology Office Note   Date:  02/16/2016   ID:  Elizabeth Maldonado, DOB [birth year], MRN [id number]  PCP:  Reginia Naas, MD  Cardiologist:   Skeet Latch, MD   Chief Complaint  Patient presents with  . New Patient (Initial Visit)  . Edema    ankles;      History of Present Illness: Elizabeth Maldonado is a 71 y.o. female with hyperlipidemia and hypothyroidism who presents for management of hyperlipidemia.  Elizabeth Maldonado has known about her hyperlipidemia since 2003.  At times her total cholesterol has been >300.  She has tried colestin, lipitor, Vytorin, crestor, niaspan, red yeast rice, pravastatin, zetia.  Most recently she tried pravastatin 20 mg daily but didn't tolerate it due to hoarseness and myalgias.  She was referred by Dr. Carol Ada to discuss other treatment options.  She has also tried dietary changes and eats a lot of fiber.  Despite this, her most recent LDL was 207.  She has also been exercising and lost 35 lb in the last 10 months.  She was exercising on the treadmill but fell in November and sprained her knee.  She is in the process of purchasing a recumbent bike and hopes to start back exercising soon.  She has a family history of premature CAD.  Her father had a heart attack in his 50s and her mother had a heart attack in her 15s.  Her brother had CABG at age 59.  Elizabeth Maldonado has been feeling well and denies chest pain, shortness of breath, orthopnea or PND.    Past Medical History:  Diagnosis Date  . Diverticulosis   . Hypercholesteremia   . Hyperlipidemia 02/16/2016  . Hypothyroid   . Hypothyroidism 02/16/2016    Past Surgical History:  Procedure Laterality Date  . APPENDECTOMY    . BUNIONECTOMY    . CATARACT EXTRACTION    . CESAREAN SECTION    . CHOLECYSTECTOMY    . FOOT SURGERY    . TONSILLECTOMY       Current Outpatient Prescriptions  Medication Sig Dispense Refill  . levothyroxine (SYNTHROID, LEVOTHROID) 75 MCG tablet Take 75 mcg by mouth daily.      No current facility-administered medications for this visit.     Allergies:   Acetaminophen; Crestor [rosuvastatin]; Lipitor [atorvastatin]; and Vytorin [ezetimibe-simvastatin]    Social History:  The patient  reports that she has never smoked. She has never used smokeless tobacco. She reports that she does not drink alcohol or use drugs.   Family History:  The patient's family history includes CAD in her brother; Cancer in her brother, brother, father, and sister; Diabetes in her brother, brother, and sister; Heart attack in her father and mother; Hypertension in her mother.    ROS:  Please see the history of present illness.   Otherwise, review of systems are positive for none.   All other systems are reviewed and negative.    PHYSICAL EXAM: VS:  BP 126/73   Pulse 63   Ht 5\' 2"  (1.575 m)   Wt 87.5 kg (192 lb 12.8 oz)   BMI 35.26 kg/m  , BMI Body mass index is 35.26 kg/m. GENERAL:  Well appearing HEENT:  Pupils equal round and reactive, fundi not visualized, oral mucosa unremarkable NECK:  No jugular venous distention, waveform within normal limits, carotid upstroke brisk and symmetric, no bruits, no thyromegaly LYMPHATICS:  No cervical adenopathy LUNGS:  Clear to auscultation bilaterally HEART:  RRR.  PMI not displaced or sustained,S1  and S2 within normal limits, no S3, no S4, no clicks, no rubs, no murmurs ABD:  Flat, positive bowel sounds normal in frequency in pitch, no bruits, no rebound, no guarding, no midline pulsatile mass, no hepatomegaly, no splenomegaly EXT:  2 plus pulses throughout, no edema, no cyanosis no clubbing SKIN:  No rashes no nodules NEURO:  Cranial nerves II through XII grossly intact, motor grossly intact throughout PSYCH:  Cognitively intact, oriented to person place and time    EKG:  EKG is ordered today. The ekg ordered today demonstrates sinus rhythm rate 63 bpm.  Low voltage precordial leads   Recent Labs: 05/17/2015: ALT 18; BUN 11;  Creatinine, Ser 0.93; Hemoglobin 14.1; Platelets 209; Potassium 4.1; Sodium 140   01/13/16: Sodium 140, potassium 4.5, BUN 21, creatinine 0.77 AST 12, ALT 12+ TSH 1.3 Total cholesterol 291, triglycerides 173, HDL 49, LDL 207 WBC 4.8, hemoglobin 13.6, hematocrit 41.4, platelets 20  Lipid Panel No results found for: CHOL, TRIG, HDL, CHOLHDL, VLDL, LDLCALC, LDLDIRECT    Wt Readings from Last 3 Encounters:  02/15/16 87.5 kg (192 lb 12.8 oz)  12/16/15 83.9 kg (185 lb)  12/02/15 86.2 kg (190 lb)      ASSESSMENT AND PLAN:  # Familial hyperlipidemia: Elizabeth Maldonado has a family history of premature CAD and an LDL of 190.  She does not have any tendon xanthomas.  Given her [place] Lipid Score of 5 she has probable FH.  She has been intolerant of several medications.  We will refer her to the lipid clinic for consideration of a PCSK9 inhibitor.   Current medicines are reviewed at length with the patient today.  The patient does not have concerns regarding medicines.  The following changes have been made:  no change  Labs/ tests ordered today include:  No orders of the defined types were placed in this encounter.    Disposition:   FU with Zyliah Schier C. Oval Linsey, MD, [facility] in 1 year.  Lipid clinic    This note was written with the assistance of speech recognition software.  Please excuse any transcriptional errors.  Signed, Bruchy Mikel C. Oval Linsey, MD, [facility]  02/16/2016 10:21 PM    [place]

## 2016-02-15 NOTE — Patient Instructions (Signed)
Medication Instructions:  Your physician recommends that you continue on your current medications as directed. Please refer to the Current Medication list given to you today.  Labwork: NONE  Testing/Procedures: NONE  Follow-Up: Your physician recommends that you schedule a follow-up appointment in: [place] AS POSSIBLE  Your physician wants you to follow-up in: [place] will receive a reminder letter in the mail two months in advance. If you don't receive a letter, please call our office to schedule the follow-up appointment.  If you need a refill on your cardiac medications before your next appointment, please call your pharmacy.

## 2016-02-16 ENCOUNTER — Encounter: Payer: Self-pay | Admitting: Cardiovascular Disease

## 2016-02-16 DIAGNOSIS — E039 Hypothyroidism, unspecified: Secondary | ICD-10-CM

## 2016-02-16 DIAGNOSIS — E785 Hyperlipidemia, unspecified: Secondary | ICD-10-CM | POA: Insufficient documentation

## 2016-02-16 HISTORY — DX: Hypothyroidism, unspecified: E03.9

## 2016-02-16 HISTORY — DX: Hyperlipidemia, unspecified: E78.5

## 2016-02-17 ENCOUNTER — Ambulatory Visit (INDEPENDENT_AMBULATORY_CARE_PROVIDER_SITE_OTHER): Payer: Medicare Other | Admitting: Pharmacist

## 2016-02-17 DIAGNOSIS — E7849 Other hyperlipidemia: Secondary | ICD-10-CM

## 2016-02-17 DIAGNOSIS — E784 Other hyperlipidemia: Secondary | ICD-10-CM | POA: Diagnosis not present

## 2016-02-17 DIAGNOSIS — E7800 Pure hypercholesterolemia, unspecified: Secondary | ICD-10-CM

## 2016-02-17 NOTE — Patient Instructions (Addendum)
Phone # [phone] Fax # [phone] [email or web address]      Cholesterol Cholesterol is a white, waxy, fat-like substance that is needed by the human body in small amounts. The liver makes all the cholesterol we need. Cholesterol is carried from the liver by the blood through the blood vessels. Deposits of cholesterol (plaques) may build up on blood vessel (artery) walls. Plaques make the arteries narrower and stiffer. Cholesterol plaques increase the risk for heart attack and stroke. You cannot feel your cholesterol level even if it is very high. The only way to know that it is high is to have a blood test. Once you know your cholesterol levels, you should keep a record of the test results. Work with your health care provider to keep your levels in the desired range. What do the results mean?  Total cholesterol is a rough measure of all the cholesterol in your blood.  LDL (low-density lipoprotein) is the "bad" cholesterol. This is the type that causes plaque to build up on the artery walls. You want this level to be low.  HDL (high-density lipoprotein) is the "good" cholesterol because it cleans the arteries and carries the LDL away. You want this level to be high.  Triglycerides are fat that the body can either burn for energy or store. High levels are closely linked to heart disease. What are the desired levels of cholesterol?  Total cholesterol below 200.  LDL below 100 for people who are at risk, below 70 for people at very high risk.  HDL above 40 is good. A level of 60 or higher is considered to be protective against heart disease.  Triglycerides below 150. How can I lower my cholesterol? Diet  Follow your diet program as told by your health care provider.  Choose fish or white meat chicken and [place], roasted or baked. Limit fatty cuts of red meat, fried foods, and processed meats, such as sausage and lunch meats.  Eat lots of fresh fruits and  vegetables.  Choose whole grains, beans, pasta, potatoes, and cereals.  Choose olive oil, corn oil, or canola oil, and use only small amounts.  Avoid butter, mayonnaise, shortening, or palm kernel oils.  Avoid foods with trans fats.  Drink skim or nonfat milk and eat low-fat or nonfat yogurt and cheeses. Avoid whole milk, cream, ice cream, egg yolks, and full-fat cheeses.  Healthier desserts include angel food cake, ginger snaps, animal crackers, hard candy, popsicles, and low-fat or nonfat frozen yogurt. Avoid pastries, cakes, pies, and cookies. Exercise  Follow your exercise program as told by your health care provider. A regular program:  Helps to decrease LDL and raise HDL.  Helps with weight control.  Do things that increase your activity level, such as gardening, walking, and taking the stairs.  Ask your health care provider about ways that you can be more active in your daily life. Medicine  Take over-the-counter and prescription medicines only as told by your health care provider.  Medicine may be prescribed by your health care provider to help lower cholesterol and decrease the risk for heart disease. This is usually done if diet and exercise have failed to bring down cholesterol levels.  If you have several risk factors, you may need medicine even if your levels are normal. This information is not intended to replace advice given to you by your health care provider. Make sure you discuss any questions you have with your health care provider. Document Released: 09/13/2000 Document Revised: 07/17/2015 Document Reviewed: 06/19/2015  Elsevier Interactive Patient Education  2017 [employer]vier Inc.  

## 2016-02-17 NOTE — Progress Notes (Signed)
Patient ID: [name]                 DOB: 1945/04/09                    MRN: [id number]     HPI:  Elizabeth Maldonado is a 71 y.o. female patient referred to lipid clinic by Dr Oval Linsey.  PMI includes hyperlipidemia and hypothyroidism.  Previously tried cholestin, Lipitor, Vytorin, Crestor, Niaspan, red yeast rice, pravastatin, Zetia but unable to tolerate therapy.  She has also tried lifestyle modifications including high fiber and low fat diet, exercising, and 35 lbs weight loss.  Despite this, her most recent LDL was 207 on January /11/2016.  She has a strong family history of premature CAD.  Her father had a heart attack in his 51s and her mother had a heart attack in her 46s.  Her brother had CABG at age 25.  Her sister also has high cholesterol but no CAD at this time.   Patient presents today to evaluate options for LDL management including PCSK9 inhibitors. She has been feeling well and denies chest pain or shortness of breath.  Current Medications: none  Intolerances:  Pravastatin 20mg  daily  - hoarseness and myalgias Ezetimibe 10mg  - myalgias Red yeast rice - myalgias Crestor 5mg  - severe myalgias Cholestin  Risk Factors: First degree relative with premature CVD, LDL 207   LDL goal: <100  Diet: low fat, high fiber  Exercise: daily treadmill bike  Family History:  CAD in her brother with CABG at 44; Cancer in her brother, brother, father, and sister; Diabetes in her brother, brother, and sister; Heart attack in her father at age 5s and mother at age 84s; Hypertension in her mother.   Social History: denies smoking, smokeless tobacco, alcohol or drugs  Labs: CHO 291; HDL 49; TG 173; LDL 207 (Jan/11/18)  Past Medical History:  Diagnosis Date  . Diverticulosis   . Hypercholesteremia   . Hyperlipidemia 02/16/2016  . Hypothyroid   . Hypothyroidism 02/16/2016    Current Outpatient Prescriptions on File Prior to Visit  Medication Sig Dispense Refill  . levothyroxine (SYNTHROID,  LEVOTHROID) 75 MCG tablet Take 75 mcg by mouth daily.     No current facility-administered medications on file prior to visit.     Allergies  Allergen Reactions  . Acetaminophen Itching    "hyperactivity"  . Crestor [Rosuvastatin]   . Lipitor [Atorvastatin]   . Vytorin [Ezetimibe-Simvastatin]     Familial Hyperlipidemia:  Noted patient strong family history of premature CAD and elevated LDL >190.  Patient unable to tolerated statins, Zetia or Cholestine.  Appropriate lifestyle modifications implemented with 35 lbs weight lost but minimal influence on LDL.  [place] Diagnostic Criteria Score of 5 indicates Possible Familial Hypercholesterolemia.    Will submit paperwork for [facility] insurance pre-authorization.  Repatha and Praluent administration, storage and potential ADR discussed with patient during visit.  She is willing to try PCSK9i at this time. Potential for Clinical trials also discussed with patient, but will be considered only if FDA approved alternative not an option.  Will follow up with pharmacists clinic as needed.  Elizabeth Maldonado PharmD, [place] [id number] 02/17/2016 2:08 PM

## 2016-02-24 ENCOUNTER — Telehealth: Payer: Self-pay | Admitting: Pharmacist

## 2016-02-24 NOTE — Telephone Encounter (Signed)
Patient informed of Repatha pre-authorization denial.  Discussed option for clinical trial but patient not interested at this time.  Patient will continue lifestyle modifications and re-assess PCSK9i in the future.

## 2016-03-16 ENCOUNTER — Ambulatory Visit: Payer: Medicare Other

## 2017-01-31 ENCOUNTER — Emergency Department (HOSPITAL_BASED_OUTPATIENT_CLINIC_OR_DEPARTMENT_OTHER)
Admission: EM | Admit: 2017-01-31 | Discharge: 2017-02-01 | Disposition: A | Discharge status: Home / Self Care | Payer: Medicare Other | Attending: Emergency Medicine | Admitting: Emergency Medicine

## 2017-01-31 ENCOUNTER — Other Ambulatory Visit: Payer: Self-pay

## 2017-01-31 ENCOUNTER — Encounter (HOSPITAL_BASED_OUTPATIENT_CLINIC_OR_DEPARTMENT_OTHER): Payer: Self-pay

## 2017-01-31 ENCOUNTER — Emergency Department (HOSPITAL_BASED_OUTPATIENT_CLINIC_OR_DEPARTMENT_OTHER): Payer: Medicare Other

## 2017-01-31 DIAGNOSIS — E039 Hypothyroidism, unspecified: Secondary | ICD-10-CM | POA: Insufficient documentation

## 2017-01-31 DIAGNOSIS — Z79899 Other long term (current) drug therapy: Secondary | ICD-10-CM | POA: Diagnosis not present

## 2017-01-31 DIAGNOSIS — J209 Acute bronchitis, unspecified: Secondary | ICD-10-CM | POA: Insufficient documentation

## 2017-01-31 DIAGNOSIS — R05 Cough: Secondary | ICD-10-CM | POA: Diagnosis present

## 2017-01-31 MED ORDER — AZITHROMYCIN 250 MG PO TABS
500.0000 mg | ORAL_TABLET | Freq: Once | ORAL | Status: AC
  Administered 2017-02-01: 500 mg via ORAL
  Filled 2017-01-31: qty 2

## 2017-01-31 MED ORDER — AZITHROMYCIN 250 MG PO TABS: ORAL_TABLET | ORAL | 0 refills | Status: DC

## 2017-01-31 NOTE — Discharge Instructions (Signed)
Zithromax as prescribed.  Continue over-the-counter medications as needed for symptom relief.  Return to the ER if symptoms significantly worsen or change.

## 2017-01-31 NOTE — ED Provider Notes (Signed)
[facility]ER[facility] DEPARTMENT Provider Note   CSN: [id number] Arrival date & time: 01/31/17  2214     History   Chief Complaint Chief Complaint  Patient presents with  . Cough    HPI Elizabeth Maldonado is a 72 y.o. female.  Patient is a 72 year old female with past medical history of hyperlipidemia, hypothyroidism presenting for evaluation of chest congestion, nasal congestion, productive cough worsening over the past 2 weeks.  She has tried over-the-counter medications with no relief.  She reports burning in her chest when she coughs but denies any shortness of breath.   The history is provided by the patient.  Cough  This is a new problem. Episode onset: 2 weeks ago. The problem occurs constantly. The problem has been gradually worsening. There has been no fever. Associated symptoms include chest pain and rhinorrhea. Pertinent negatives include no chills, no ear congestion and no shortness of breath. She has tried decongestants for the symptoms. The treatment provided no relief. She is not a smoker.    Past Medical History:  Diagnosis Date  . Diverticulosis   . Hypercholesteremia   . Hyperlipidemia 02/16/2016  . Hypothyroid   . Hypothyroidism 02/16/2016    Patient Active Problem List   Diagnosis Date Noted  . Hyperlipidemia 02/16/2016  . Hypothyroidism 02/16/2016  . Plantar fasciitis of right foot 12/16/2015  . Metatarsal deformity, right 12/16/2015  . Pronation deformity of ankle, acquired 12/16/2015    Past Surgical History:  Procedure Laterality Date  . APPENDECTOMY    . BUNIONECTOMY    . CATARACT EXTRACTION    . CESAREAN SECTION    . CHOLECYSTECTOMY    . FOOT SURGERY    . TONSILLECTOMY      OB History    No data available       Home Medications    Prior to Admission medications   Medication Sig Start Date End Date Taking? Authorizing Provider  Pseudoephedrine HCl (SUDAFED PO) Take by mouth.   Yes [provider]  levothyroxine  (SYNTHROID, LEVOTHROID) 75 MCG tablet Take 75 mcg by mouth daily.    [provider]    Family History Family History  Problem Relation Age of Onset  . Hypertension Mother   . Heart attack Mother   . Cancer Father   . Heart attack Father   . Cancer Sister   . Diabetes Sister   . Cancer Brother   . Diabetes Brother   . CAD Brother   . Cancer Brother   . Diabetes Brother     Social History Social History   Tobacco Use  . Smoking status: Never Smoker  . Smokeless tobacco: Never Used  Substance Use Topics  . Alcohol use: No  . Drug use: No     Allergies   Acetaminophen; Crestor [rosuvastatin]; Lipitor [atorvastatin]; and Vytorin [ezetimibe-simvastatin]   Review of Systems Review of Systems  Constitutional: Negative for chills.  HENT: Positive for rhinorrhea.   Respiratory: Positive for cough. Negative for shortness of breath.   Cardiovascular: Positive for chest pain.  All other systems reviewed and are negative.    Physical Exam Updated Vital Signs BP 131/79 (BP Location: Right Arm) Comment: pt hyperventilating/screaming cuff is too tight  Pulse (!) 111   Resp (!) 24   SpO2 100%   Physical Exam  Constitutional: She is oriented to person, place, and time. She appears well-developed and well-nourished. No distress.  HENT:  Head: Normocephalic and atraumatic.  Mouth/Throat: Oropharynx is clear and moist.  Neck: Normal range of motion. Neck supple.  Cardiovascular: Normal rate and regular rhythm. Exam reveals no gallop and no friction rub.  No murmur heard. Pulmonary/Chest: Effort normal and breath sounds normal. No respiratory distress. She has no wheezes.  Abdominal: Soft. Bowel sounds are normal. She exhibits no distension. There is no tenderness.  Musculoskeletal: Normal range of motion.  Neurological: She is alert and oriented to person, place, and time.  Skin: Skin is warm and dry. She is not diaphoretic.  Nursing note and vitals  reviewed.    ED Treatments / Results  Labs (all labs ordered are listed, but only abnormal results are displayed) Labs Reviewed - No data to display  EKG  EKG Interpretation None       Radiology Dg Chest 2 View  Result Date: 01/31/2017 CLINICAL DATA:  Flu-like symptoms for 2 weeks. EXAM: CHEST  2 VIEW COMPARISON:  None. FINDINGS: Heart size and mediastinal contours are normal. Lungs are clear. No pleural effusion. Mild degenerative spurring within the slightly kyphotic thoracic spine. Levoscoliosis, mild to moderate in degree. No acute or suspicious osseous finding. IMPRESSION: No active cardiopulmonary disease. No evidence of pneumonia or pulmonary edema. Electronically Signed   By: Franki Cabot M.D.   On: 01/31/2017 22:51    Procedures Procedures (including critical care time)  Medications Ordered in ED Medications - No data to display   Initial Impression / Assessment and Plan / ED Course  I have reviewed the triage vital signs and the nursing notes.  Pertinent labs & imaging results that were available during my care of the patient were reviewed by me and considered in my medical decision making (see chart for details).  Patient with 2-week history of persistent URI symptoms.  Her lungs are clear and chest x-ray is negative.  Due to the duration of her symptoms, she will be treated for acute bronchitis with Zithromax and continued over-the-counter medications.  She is to return as needed for any problems.  Final Clinical Impressions(s) / ED Diagnoses   Final diagnoses:  None    ED Discharge Orders    None       Veryl Speak, MD 01/31/17 2357

## 2017-01-31 NOTE — ED Triage Notes (Addendum)
Pt c/o flu like sx x 2 weeks-pt entered triage ambulatory/hyperventilating/anxious-was instructed to attempt to slow breathing-states is difficult because her nose is stopped up- RT in triage for assessment upon arrival-pt able to answer all ?s

## 2017-02-01 MED FILL — AZITHROMYCIN 250 MG TABLET: 250 | 4 days supply | Qty: 4 | Fill #0

## 2017-06-13 ENCOUNTER — Other Ambulatory Visit: Payer: Self-pay | Admitting: Orthopedic Surgery

## 2017-06-13 DIAGNOSIS — G8929 Other chronic pain: Secondary | ICD-10-CM

## 2017-06-13 DIAGNOSIS — M25561 Pain in right knee: Principal | ICD-10-CM

## 2017-06-19 ENCOUNTER — Ambulatory Visit
Admission: RE | Admit: 2017-06-19 | Discharge: 2017-06-19 | Disposition: A | Discharge status: Home / Self Care | Payer: Medicare Other | Source: Ambulatory Visit | Attending: Orthopedic Surgery | Admitting: Orthopedic Surgery

## 2017-06-19 DIAGNOSIS — M25561 Pain in right knee: Principal | ICD-10-CM

## 2017-06-19 DIAGNOSIS — G8929 Other chronic pain: Secondary | ICD-10-CM

## 2017-07-13 ENCOUNTER — Emergency Department (HOSPITAL_COMMUNITY)
Admission: EM | Admit: 2017-07-13 | Discharge: 2017-07-14 | Disposition: A | Discharge status: Home / Self Care | Payer: Medicare Other | Attending: Emergency Medicine | Admitting: Emergency Medicine

## 2017-07-13 DIAGNOSIS — K6289 Other specified diseases of anus and rectum: Secondary | ICD-10-CM | POA: Diagnosis present

## 2017-07-13 DIAGNOSIS — Z9889 Other specified postprocedural states: Secondary | ICD-10-CM | POA: Diagnosis not present

## 2017-07-13 DIAGNOSIS — E039 Hypothyroidism, unspecified: Secondary | ICD-10-CM | POA: Insufficient documentation

## 2017-07-13 DIAGNOSIS — R1030 Lower abdominal pain, unspecified: Secondary | ICD-10-CM | POA: Diagnosis not present

## 2017-07-13 DIAGNOSIS — R300 Dysuria: Secondary | ICD-10-CM | POA: Insufficient documentation

## 2017-07-13 LAB — CBC WITH DIFFERENTIAL/PLATELET
Basophils Absolute: 0 10*3/uL (ref 0.0–0.1)
Basophils Relative: 0 %
Eosinophils Absolute: 0.1 10*3/uL (ref 0.0–0.7)
Eosinophils Relative: 1 %
HCT: 38.2 % (ref 36.0–46.0)
Hemoglobin: 12.7 g/dL (ref 12.0–15.0)
Lymphocytes Relative: 14 %
Lymphs Abs: 1.3 10*3/uL (ref 0.7–4.0)
MCH: 31.1 pg (ref 26.0–34.0)
MCHC: 33.2 g/dL (ref 30.0–36.0)
MCV: 93.6 fL (ref 78.0–100.0)
Monocytes Absolute: 0.4 10*3/uL (ref 0.1–1.0)
Monocytes Relative: 5 %
Neutro Abs: 7.6 10*3/uL (ref 1.7–7.7)
Neutrophils Relative %: 80 %
Platelets: 215 10*3/uL (ref 150–400)
RBC: 4.08 MIL/uL (ref 3.87–5.11)
RDW: 13.7 % (ref 11.5–15.5)
WBC: 9.5 10*3/uL (ref 4.0–10.5)

## 2017-07-13 MED ORDER — MORPHINE SULFATE (PF) 4 MG/ML IV SOLN
4.0000 mg | Freq: Once | INTRAVENOUS | Status: AC
  Administered 2017-07-13: 4 mg via INTRAVENOUS
  Filled 2017-07-13: qty 1

## 2017-07-13 MED ORDER — ONDANSETRON HCL 4 MG/2ML IJ SOLN
4.0000 mg | Freq: Once | INTRAMUSCULAR | Status: AC
  Administered 2017-07-13: 4 mg via INTRAVENOUS
  Filled 2017-07-13: qty 2

## 2017-07-13 NOTE — ED Notes (Signed)
Tried IV x2. Jake, RN to do ultrasound IV

## 2017-07-13 NOTE — ED Triage Notes (Signed)
Pt BIB [facility] from home. Pt had knee surgery of the right knee on Wednesday. She has not been able to have a BM since then. C/o pain in lower abdomen. Also having pain upon urination.

## 2017-07-13 NOTE — ED Provider Notes (Addendum)
[facility] DEPT Provider Note   CSN: [id number] Arrival date & time: 07/13/17  2247     History   Chief Complaint Chief Complaint  Patient presents with  . Abdominal Pain    HPI Elizabeth Maldonado is a 72 y.o. female.  The history is provided by the patient.  She has history of diverticulosis, hyperlipidemia, hypothyroidism and is 2 days post arthroscopic surgery on her right knee.  She has not had a bowel movement since then.  At 9 PM, she started having severe pain in the rectal area and also some burning on urination.  There is associated nausea.  Pain does radiate to the lower abdomen.  She rates pain at 10/10.  She denies fever or chills.  Nothing pain makes pain better, nothing makes it worse.  Past Medical History:  Diagnosis Date  . Diverticulosis   . Hypercholesteremia   . Hyperlipidemia 02/16/2016  . Hypothyroid   . Hypothyroidism 02/16/2016    Patient Active Problem List   Diagnosis Date Noted  . Hyperlipidemia 02/16/2016  . Hypothyroidism 02/16/2016  . Plantar fasciitis of right foot 12/16/2015  . Metatarsal deformity, right 12/16/2015  . Pronation deformity of ankle, acquired 12/16/2015    Past Surgical History:  Procedure Laterality Date  . APPENDECTOMY    . BUNIONECTOMY    . CATARACT EXTRACTION    . CESAREAN SECTION    . CHOLECYSTECTOMY    . FOOT SURGERY    . TONSILLECTOMY       OB History   None      Home Medications    Prior to Admission medications   Medication Sig Start Date End Date Taking? Authorizing Provider  azithromycin (ZITHROMAX Z-PAK) 250 MG tablet Take 1 tablet daily for the next 4 days. 01/31/17   Veryl Speak, MD  levothyroxine (SYNTHROID, LEVOTHROID) 75 MCG tablet Take 75 mcg by mouth daily.    [provider]  Pseudoephedrine HCl (SUDAFED PO) Take by mouth.    [provider]    Family History Family History  Problem Relation Age of Onset  . Hypertension Mother   . Heart  attack Mother   . Cancer Father   . Heart attack Father   . Cancer Sister   . Diabetes Sister   . Cancer Brother   . Diabetes Brother   . CAD Brother   . Cancer Brother   . Diabetes Brother     Social History Social History   Tobacco Use  . Smoking status: Never Smoker  . Smokeless tobacco: Never Used  Substance Use Topics  . Alcohol use: No  . Drug use: No     Allergies   Acetaminophen; Crestor [rosuvastatin]; Lipitor [atorvastatin]; and Vytorin [ezetimibe-simvastatin]   Review of Systems Review of Systems  All other systems reviewed and are negative.    Physical Exam Updated Vital Signs BP 113/69   Pulse 87   Temp 98.3 F (36.8 C) (Oral)   Resp 16   SpO2 100%   Physical Exam  Nursing note and vitals reviewed.  72 year old female, crying in pain, but in no acute distress. Vital signs are normal. Oxygen saturation is 98%, which is normal. Head is normocephalic and atraumatic. PERRLA, EOMI. Oropharynx is clear. Neck is nontender and supple without adenopathy or JVD. Back is nontender and there is no CVA tenderness. Lungs are clear without rales, wheezes, or rhonchi. Chest is nontender. Heart has regular rate and rhythm without murmur. Abdomen is soft, flat, with  moderate tenderness across the lower abdomen.  There is no rebound or guarding.  There are no masses or hepatosplenomegaly and peristalsis is hypoactive. Rectal: Small amount of stool leakage.  Small external skin tag but no overt hemorrhoids.  Marked tenderness on palpation and will not tolerate full digital rectal exam. Extremities: Right knee incisions from arthroscopy are healing well without signs of infection.  No cyanosis or edema. Skin is warm and dry without rash. Neurologic: Mental status is normal, cranial nerves are intact, there are no motor or sensory deficits.  ED Treatments / Results  Labs (all labs ordered are listed, but only abnormal results are displayed) Labs Reviewed    COMPREHENSIVE METABOLIC PANEL - Abnormal; Notable for the following components:      Result Value   Glucose, Bld 109 (*)    Calcium 8.5 (*)    AST 50 (*)    ALT 62 (*)    All other components within normal limits  URINALYSIS, ROUTINE W REFLEX MICROSCOPIC - Abnormal; Notable for the following components:   Color, Urine STRAW (*)    Hgb urine dipstick SMALL (*)    Ketones, ur 5 (*)    All other components within normal limits  CBC WITH DIFFERENTIAL/PLATELET   Radiology Ct Abdomen Pelvis W Contrast  Result Date: 07/14/2017 CLINICAL DATA:  Lower abdominal pain. EXAM: CT ABDOMEN AND PELVIS WITH CONTRAST TECHNIQUE: Multidetector CT imaging of the abdomen and pelvis was performed using the standard protocol following bolus administration of intravenous contrast. CONTRAST:  118mL ISOVUE-300 IOPAMIDOL (ISOVUE-300) INJECTION 61% COMPARISON:  CT abdomen pelvis 05/17/2015 FINDINGS: LOWER CHEST: No basilar pulmonary nodules or pleural effusion. No apical pericardial effusion. HEPATOBILIARY: Normal hepatic contours and density. No intra- or extrahepatic biliary dilatation. Status post cholecystectomy. PANCREAS: Normal parenchymal contours without ductal dilatation. No peripancreatic fluid collection. SPLEEN: Normal. ADRENALS/URINARY TRACT: --Adrenal glands: Normal. --Right kidney/ureter: No hydronephrosis, nephroureterolithiasis, perinephric stranding or solid renal mass. --Left kidney/ureter: Duplicated collecting system. No hydronephrosis or nephroureterolithiasis. --Urinary bladder: Normal for degree of distention STOMACH/BOWEL: --Stomach/Duodenum: No hiatal hernia or other gastric abnormality. Normal duodenal course. --Small bowel: No dilatation or inflammation. --Colon: Sigmoid diverticulosis without acute inflammation. --Appendix: Normal. VASCULAR/LYMPHATIC: Atherosclerotic calcification is present within the non-aneurysmal abdominal aorta, without hemodynamically significant stenosis. No abdominal or  pelvic lymphadenopathy. REPRODUCTIVE: Normal uterus and ovaries. MUSCULOSKELETAL. No bony spinal canal stenosis or focal osseous abnormality. OTHER: None. IMPRESSION: 1. Sigmoid diverticulosis without acute inflammation. No acute abnormality of the abdomen or pelvis. 2.  Aortic Atherosclerosis (ICD10-I70.0). Electronically Signed   By: Ulyses Jarred M.D.   On: 07/14/2017 01:27    Procedures Procedures  Medications Ordered in ED Medications  iopamidol (ISOVUE-300) 61 % injection (has no administration in time range)  morphine 4 MG/ML injection 4 mg (4 mg Intravenous Given 07/13/17 2345)  ondansetron (ZOFRAN) injection 4 mg (4 mg Intravenous Given 07/13/17 2345)  iopamidol (ISOVUE-300) 61 % injection 100 mL (100 mLs Intravenous Contrast Given 07/14/17 0036)  lidocaine (XYLOCAINE) 2 % jelly 1 application (1 application Urethral Given 07/14/17 0057)  metoCLOPramide (REGLAN) injection 10 mg (10 mg Intravenous Given 07/14/17 0056)  diphenhydrAMINE (BENADRYL) injection 25 mg (25 mg Intravenous Given 07/14/17 0057)  morphine 4 MG/ML injection 4 mg (4 mg Intravenous Given 07/14/17 0056)     Initial Impression / Assessment and Plan / ED Course  I have reviewed the triage vital signs and the nursing notes.  Pertinent labs & imaging results that were available during my care of the patient were reviewed  by me and considered in my medical decision making (see chart for details).  Anal pain likely related to constipation from narcotic use.  However, pain seems out of proportion and abdominal tenderness would be somewhat atypical.  She will be sent for CT of abdomen and pelvis to rule out more serious pathology such as diverticulitis.  Also, will check urinalysis to evaluate for possible UTI.  Old records are reviewed confirming recent outpatient evaluation by orthopedics at [facility].  Operative note from arthroscopy is not available on care everywhere.  She has 3 prior CT scans of abdomen and pelvis  with showing diverticulitis-one in 2005, one in 2013, and  One in 2017.  CT scan is unremarkable.  On my review of the images, there does seem to be some distention of the rectum, and this is probably accounting for her pain.  However, there is not a large fecal impaction.  She has passed stool several times in the ED, so I do not feel she needs an enema.  She is currently resting much more comfortably.  Urinalysis shows no evidence of UTI.  She is discharged with instructions to take MiraLAX as needed while taking narcotics.  Final Clinical Impressions(s) / ED Diagnoses   Final diagnoses:  Lower abdominal pain    ED Discharge Orders    None       Delora Fuel, MD 16/10/96 [phone]  When she got up for discharge, she had recurrence of severe pain and nausea.  She was given intramuscular promethazine and a soapsuds enema.  She had good results with this and is now feeling much better.  She is discharged with same recommendations.   Delora Fuel, MD 09/81/19 [phone]

## 2017-07-13 NOTE — ED Notes (Signed)
Pt refuses to lay on back so that we can obtain a urine sample.

## 2017-07-13 NOTE — ED Notes (Signed)
Bed: WA07 Expected date:  Expected time:  Means of arrival:  Comments: EMS 72 yo female abdominal pain/constipation from pain meds after arthroscopy on knee

## 2017-07-14 ENCOUNTER — Emergency Department (HOSPITAL_COMMUNITY): Payer: Medicare Other

## 2017-07-14 LAB — COMPREHENSIVE METABOLIC PANEL
ALT: 62 U/L — ABNORMAL HIGH (ref 0–44)
AST: 50 U/L — ABNORMAL HIGH (ref 15–41)
Albumin: 3.8 g/dL (ref 3.5–5.0)
Alkaline Phosphatase: 68 U/L (ref 38–126)
Anion gap: 9 (ref 5–15)
BUN: 23 mg/dL (ref 8–23)
CO2: 24 mmol/L (ref 22–32)
Calcium: 8.5 mg/dL — ABNORMAL LOW (ref 8.9–10.3)
Chloride: 106 mmol/L (ref 98–111)
Creatinine, Ser: 0.9 mg/dL (ref 0.44–1.00)
GFR calc Af Amer: >60 mL/min (ref >60)
GFR calc non Af Amer: >60 mL/min (ref >60)
Glucose, Bld: 109 mg/dL — ABNORMAL HIGH (ref 70–99)
Potassium: 4.1 mmol/L (ref 3.5–5.1)
Sodium: 139 mmol/L (ref 135–145)
Total Bilirubin: 1.1 mg/dL (ref 0.3–1.2)
Total Protein: 6.9 g/dL (ref 6.5–8.1)

## 2017-07-14 LAB — URINALYSIS, ROUTINE W REFLEX MICROSCOPIC
Bacteria, UA: NONE SEEN
Bilirubin Urine: NEGATIVE
Glucose, UA: NEGATIVE mg/dL
Ketones, ur: 5 mg/dL — AB
Leukocytes, UA: NEGATIVE
Nitrite: NEGATIVE
Protein, ur: NEGATIVE mg/dL
Specific Gravity, Urine: 1.017 (ref 1.005–1.030)
pH: 5 (ref 5.0–8.0)

## 2017-07-14 MED ORDER — IOPAMIDOL (ISOVUE-300) INJECTION 61%
100.0000 mL | Freq: Once | INTRAVENOUS | Status: AC | PRN
  Administered 2017-07-14: 100 mL via INTRAVENOUS

## 2017-07-14 MED ORDER — MORPHINE SULFATE (PF) 4 MG/ML IV SOLN
4.0000 mg | Freq: Once | INTRAVENOUS | Status: AC
  Administered 2017-07-14: 4 mg via INTRAVENOUS
  Filled 2017-07-14: qty 1

## 2017-07-14 MED ORDER — LIDOCAINE HCL URETHRAL/MUCOSAL 2 % EX GEL
1.0000 "application " | Freq: Once | CUTANEOUS | Status: AC
  Administered 2017-07-14: 1 via URETHRAL
  Filled 2017-07-14: qty 5

## 2017-07-14 MED ORDER — DIPHENHYDRAMINE HCL 50 MG/ML IJ SOLN
25.0000 mg | Freq: Once | INTRAMUSCULAR | Status: AC
  Administered 2017-07-14: 25 mg via INTRAVENOUS
  Filled 2017-07-14: qty 1

## 2017-07-14 MED ORDER — METOCLOPRAMIDE HCL 5 MG/ML IJ SOLN
10.0000 mg | Freq: Once | INTRAMUSCULAR | Status: AC
Start: 2017-07-14 — End: 2017-07-14
  Administered 2017-07-14: 10 mg via INTRAVENOUS
  Filled 2017-07-14: qty 2

## 2017-07-14 MED ORDER — IOPAMIDOL (ISOVUE-300) INJECTION 61%
INTRAVENOUS | Status: AC
  Filled 2017-07-14: qty 100

## 2017-07-14 MED ORDER — PROMETHAZINE HCL 25 MG/ML IJ SOLN
12.5000 mg | Freq: Once | INTRAMUSCULAR | Status: AC
  Administered 2017-07-14: 12.5 mg via INTRAMUSCULAR
  Filled 2017-07-14: qty 1

## 2017-07-14 NOTE — ED Notes (Signed)
Pt refusing in and out and this time. [place] place to try and obtain urine.

## 2017-07-14 NOTE — ED Notes (Signed)
Bed: PZ02 Expected date:  Expected time:  Means of arrival:  Comments: Hold 1-8

## 2017-07-14 NOTE — Discharge Instructions (Addendum)
Take Miralax as needed top keep from getting constipated. You will probably need to take it for as long as you are taking oxycodone. You may take it up to four times a day as needed.

## 2018-05-29 ENCOUNTER — Encounter: Payer: Medicare Other | Admitting: Obstetrics and Gynecology

## 2018-06-05 ENCOUNTER — Encounter: Payer: Self-pay | Admitting: Obstetrics & Gynecology

## 2018-06-05 ENCOUNTER — Other Ambulatory Visit: Payer: Self-pay

## 2018-06-05 ENCOUNTER — Ambulatory Visit: Payer: Medicare Other | Admitting: Obstetrics & Gynecology

## 2018-06-05 ENCOUNTER — Ambulatory Visit (HOSPITAL_BASED_OUTPATIENT_CLINIC_OR_DEPARTMENT_OTHER)
Admission: RE | Admit: 2018-06-05 | Discharge: 2018-06-05 | Disposition: A | Discharge status: Home / Self Care | Payer: Medicare Other | Source: Ambulatory Visit | Attending: Obstetrics & Gynecology | Admitting: Obstetrics & Gynecology

## 2018-06-05 VITALS — BP 142/53 | HR 76 | Ht 62.5 in | Wt 208.0 lb

## 2018-06-05 DIAGNOSIS — B3731 Acute candidiasis of vulva and vagina: Secondary | ICD-10-CM

## 2018-06-05 DIAGNOSIS — B373 Candidiasis of vulva and vagina: Secondary | ICD-10-CM | POA: Diagnosis not present

## 2018-06-05 DIAGNOSIS — N950 Postmenopausal bleeding: Secondary | ICD-10-CM

## 2018-06-05 MED ORDER — TERCONAZOLE 0.4 % VA CREA: 1.0000 | TOPICAL_CREAM | Freq: Every day | VAGINAL | 0 refills | Status: DC

## 2018-06-05 NOTE — Patient Instructions (Signed)
Postmenopausal Bleeding  Postmenopausal bleeding is any bleeding that a woman has after she has entered into menopause. Menopause is the end of a woman's fertile years. After menopause, a woman no longer ovulates and does not have menstrual periods. Postmenopausal bleeding may have various causes, including:  Menopausal hormone therapy (MHT).  Endometrial atrophy. After menopause, low estrogen hormone levels cause the membrane that lines the uterus (endometrium) to become thinner. You may have bleeding as the endometrium thins.  Endometrial hyperplasia. This condition is caused by excess estrogen hormones and low levels of progesterone hormones. The excess estrogen causes the endometrium to thicken, which can lead to bleeding. In some cases, this can lead to cancer of the uterus.  Endometrial cancer.  Non-cancerous growths (polyps) on the endometrium, the lining of the uterus, or the cervix.  Uterine fibroids. These are non-cancerous growths in or around the uterus muscle tissue that can cause heavy bleeding. Any type of postmenopausal bleeding, even if it appears to be a typical menstrual period, should be evaluated by your health care provider. Treatment will depend on the cause of the bleeding. Follow these instructions at home:  Pay attention to any changes in your symptoms.  Avoid using tampons and douches as told by your health care provider.  Change your pads regularly.  Get regular pelvic exams and Pap tests.  Take iron supplements as told by your health care provider.  Take over-the-counter and prescription medicines only as told by your health care provider.  Keep all follow-up visits as told by your health care provider. This is important. Contact a health care provider if:  Your bleeding lasts more than 1 week.  You have abdominal pain.  You have bleeding with or after sexual intercourse.  You have bleeding that happens more often than every 3 weeks. Get help  right away if:  You have a fever, chills, headache, dizziness, muscle aches, and bleeding.  You have severe pain with bleeding.  You are passing blood clots.  You have heavy bleeding, need more than 1 pad an hour, and have never experienced this before.  You feel faint. Summary  Postmenopausal bleeding is any bleeding that a woman has after she has entered into menopause.  Postmenopausal bleeding may have various causes. Treatment will depend on the cause of the bleeding.  Any type of postmenopausal bleeding, even if it appears to be a typical menstrual period, should be evaluated by your health care provider.  Be sure to pay attention to any changes in your symptoms and keep all follow-up visits as told by your health care provider. This information is not intended to replace advice given to you by your health care provider. Make sure you discuss any questions you have with your health care provider. Document Released: 03/29/2005 Document Revised: 03/14/2016 Document Reviewed: 03/14/2016 Elsevier Interactive Patient Education  2019 [employer]  

## 2018-06-05 NOTE — Progress Notes (Signed)
Subjective:     Elizabeth Maldonado is a 73 y.o. female here for a routine exam.  LMP >30 year prev. G1P1 s/p c/s.  Current complaints: post menopausal bleeding.  Pt reports that she had 3 days of bleeding. The initial blood was bright red followed by a day of darker blood. This occurred late last month and has not recurred.Pt denies assoc sx. No unexplained weight loss or constitutional sx.  Pt recently has sx of a yeast infxn and started OTC meds last night.      Gynecologic History No LMP recorded. Patient is postmenopausal. Contraception: post menopausal status Last Pap: 01/13/2016 Results were: normal Last mammogram: annually in Jan at [facility] History OB History  Gravida Para Term Preterm AB Living  1 1 1     1   SAB TAB Ectopic Multiple Live Births          1    # Outcome Date GA Lbr Len/2nd Weight Sex Delivery Anes PTL Lv  1 Term      CS-LVertical      The following portions of the patient's history were reviewed and updated as appropriate: allergies, current medications, past family history, past medical history, past social history, past surgical history and problem list.  Review of Systems Pertinent items are noted in HPI.    Objective:  BP (!) 142/53   Pulse 76   Ht 5' 2.5" (1.588 m)   Wt 208 lb (94.3 kg)   BMI 37.44 kg/m   CONSTITUTIONAL: Well-developed, well-nourished female in no acute distress.  HENT:  Normocephalic, atraumatic EYES: Conjunctivae and EOM are normal. No scleral icterus.  NECK: Normal range of motion SKIN: Skin is warm and dry. No rash noted. Not diaphoretic.No pallor. [place]: Alert and oriented to person, place, and time. Normal coordination.  GU: EGBUS: the upper thigh has a red rash with satellite lesions c/w a candidal infxn.  Vagina: no blood in vault Cervix: no lesion; no mucopurulent d/c Uterus: small, mobile. Difficult to fully assess due to body habitus.  Adnexa: no masses; non tender   GYN procedure/ Endo bx: The indications  for endometrial biopsy were reviewed.   Risks of the biopsy including cramping, bleeding, infection, uterine perforation, inadequate specimen and need for additional procedures  were discussed. The patient states she understands and agrees to undergo procedure today. Consent was signed. Time out was performed. A sterile speculum was placed in the patient's vagina and the cervix was prepped with Betadine. A single-toothed tenaculum was placed on the anterior lip of the cervix to stabilize it. An attempt was made to insert a 3 mm Pipelle into the endometrial cavity without success. A small dilator was attempted but, the patient was not able to tolerate the procedure. The patient did not tolerate the procedure and it was, therefore, aborted. The instruments were removed from the patient's vagina.     Assessment:  PMPB- needs sampling of the endometrial and [place].   Yeast vulvo vaginitis    Plan:  Postmenopausal bleeding - Plan: US Transvaginal Non-OB, CANCELED: US Pelvis Complete  Yeast vaginitis - Plan: terconazole (TERAZOL 7) 0.4 % vaginal cream   Addendum: 06/05/2018  CLINICAL DATA:  Postmenopausal bleeding on May 9 through May 13, 2018  EXAM: ULTRASOUND PELVIS TRANSVAGINAL  TECHNIQUE: Transvaginal ultrasound examination of the pelvis was performed including evaluation of the uterus, ovaries, adnexal regions, and pelvic cul-de-sac.  COMPARISON:  CT abdomen and pelvis 07/14/2017  FINDINGS: Uterus  Measurements: 6.5 x 4.0 x 4.6  cm = volume: 62 mL. Myometrial thinning. No focal mass. Small nabothian cysts at cervix.  Endometrium  Thickness: 24 mm, abnormally thickened. Markedly heterogeneous echogenicity of the endometrial complex, with multiple cystic areas identified at especially the mid to lower uterine segments.  Right ovary  Not visualized on either transabdominal or endovaginal imaging, likely obscured by bowel  Left ovary  Not visualized on either  transabdominal or endovaginal imaging, likely obscured by bowel  Other findings:  No free pelvic fluid.  No adnexal masses.  IMPRESSION: Nonvisualization of ovaries.  Abnormal markedly thickened and heterogeneous appearing endometrial complex 24 mm thick.  In the setting of post-menopausal bleeding, endometrial sampling is indicated to exclude carcinoma. If results are benign, sonohysterogram should be considered for focal lesion work-up. (Ref: Radiological Reasoning: Algorithmic Workup of Abnormal Vaginal Bleeding with Endovaginal Sonography and Sonohysterography. AJR 2008; 591:M38-46)  These results will be called to the ordering clinician or representative by the Radiologist Assistant, and communication documented in the PACS or zVision Dashboard.    I called the patient with the results of the imaging. I have shared with her the Ddx includes endometrial cancer and a polyp.   Patient desires surgical management with hysteroscopy with dilation and curettage.  The risks of surgery were discussed in detail with the patient including but not limited to: bleeding which may require transfusion or reoperation; infection which may require prolonged hospitalization or re-hospitalization and antibiotic therapy; injury to bowel, bladder, ureters and major vessels or other surrounding organs; need for additional procedures including laparotomy; thromboembolic phenomenon, incisional problems and other postoperative or anesthesia complications.  Patient was told that the likelihood that her condition and symptoms will be treated effectively with this surgical management was very high; the postoperative expectations were also discussed in detail. The patient also understands the alternative treatment options which were discussed in full. All questions were answered.  She was told that she will be contacted by our surgical scheduler regarding the time and date of her surgery; routine preoperative  instructions of having nothing to eat or drink after midnight on the day prior to surgery and also coming to the hospital 1 1/2 hours prior to her time of surgery were also emphasized.  She was told she may be called for a preoperative appointment about a week prior to surgery and will be given further preoperative instructions at that visit. Printed patient education handouts about the procedure were given to the patient to review at home.  Total face-to-face time with patient was 35 min.  Greater than 50% was spent in counseling and coordination of care with the patient.   Joshoa Shawler L. Harraway-Smith, M.D., Cherlynn June

## 2018-06-05 NOTE — Progress Notes (Signed)
Patient had an episode of vaginal bleeding for three days (May 9-11). Patient also complaining of hormonal mood swings.Patient states she also felt like she was getting a yeast infection and started a 7 day Monistat treatment today. Kathrene Alu RN

## 2018-06-06 DIAGNOSIS — N950 Postmenopausal bleeding: Secondary | ICD-10-CM

## 2018-06-07 ENCOUNTER — Other Ambulatory Visit: Payer: Self-pay

## 2018-06-07 ENCOUNTER — Other Ambulatory Visit (HOSPITAL_COMMUNITY)
Admission: RE | Admit: 2018-06-07 | Discharge: 2018-06-07 | Disposition: A | Discharge status: Home / Self Care | Payer: Medicare Other | Source: Ambulatory Visit | Attending: Obstetrics & Gynecology | Admitting: Obstetrics & Gynecology

## 2018-06-07 DIAGNOSIS — Z1159 Encounter for screening for other viral diseases: Secondary | ICD-10-CM | POA: Insufficient documentation

## 2018-06-07 DIAGNOSIS — Z01812 Encounter for preprocedural laboratory examination: Secondary | ICD-10-CM | POA: Diagnosis present

## 2018-06-08 LAB — NOVEL CORONAVIRUS, NAA (HOSP ORDER, SEND-OUT TO REF LAB; TAT 18-24 HRS): SARS-CoV-2, NAA: NOT DETECTED

## 2018-06-10 ENCOUNTER — Encounter (HOSPITAL_COMMUNITY): Payer: Self-pay | Admitting: *Deleted

## 2018-06-10 ENCOUNTER — Other Ambulatory Visit: Payer: Self-pay

## 2018-06-11 ENCOUNTER — Encounter (HOSPITAL_COMMUNITY)
Admission: RE | Disposition: A | Discharge status: Home / Self Care | Payer: Self-pay | Source: Home / Self Care | Attending: Obstetrics & Gynecology

## 2018-06-11 ENCOUNTER — Ambulatory Visit (HOSPITAL_COMMUNITY)
Admission: RE | Admit: 2018-06-11 | Discharge: 2018-06-11 | Disposition: A | Discharge status: Home / Self Care | Payer: Medicare Other | Attending: Obstetrics & Gynecology | Admitting: Obstetrics & Gynecology

## 2018-06-11 ENCOUNTER — Other Ambulatory Visit: Payer: Self-pay

## 2018-06-11 ENCOUNTER — Ambulatory Visit (HOSPITAL_COMMUNITY): Payer: Medicare Other | Admitting: Anesthesiology

## 2018-06-11 ENCOUNTER — Encounter (HOSPITAL_COMMUNITY): Payer: Self-pay | Admitting: *Deleted

## 2018-06-11 DIAGNOSIS — B373 Candidiasis of vulva and vagina: Secondary | ICD-10-CM | POA: Insufficient documentation

## 2018-06-11 DIAGNOSIS — N840 Polyp of corpus uteri: Secondary | ICD-10-CM | POA: Diagnosis not present

## 2018-06-11 DIAGNOSIS — N950 Postmenopausal bleeding: Secondary | ICD-10-CM | POA: Diagnosis not present

## 2018-06-11 DIAGNOSIS — N8502 Endometrial intraepithelial neoplasia [EIN]: Secondary | ICD-10-CM | POA: Diagnosis not present

## 2018-06-11 DIAGNOSIS — N8500 Endometrial hyperplasia, unspecified: Secondary | ICD-10-CM

## 2018-06-11 HISTORY — PX: HYSTEROSCOPY W/D&C: SHX1775

## 2018-06-11 HISTORY — DX: Personal history of diseases of the blood and blood-forming organs and certain disorders involving the immune mechanism: Z86.2

## 2018-06-11 HISTORY — DX: Unspecified hearing loss, unspecified ear: H91.90

## 2018-06-11 LAB — BASIC METABOLIC PANEL
Anion gap: 8 (ref 5–15)
BUN: 14 mg/dL (ref 8–23)
CO2: 22 mmol/L (ref 22–32)
Calcium: 8.7 mg/dL — ABNORMAL LOW (ref 8.9–10.3)
Chloride: 108 mmol/L (ref 98–111)
Creatinine, Ser: 0.89 mg/dL (ref 0.44–1.00)
GFR calc Af Amer: >60 mL/min (ref >60)
GFR calc non Af Amer: >60 mL/min (ref >60)
Glucose, Bld: 108 mg/dL — ABNORMAL HIGH (ref 70–99)
Potassium: 3.9 mmol/L (ref 3.5–5.1)
Sodium: 138 mmol/L (ref 135–145)

## 2018-06-11 LAB — CBC
HCT: 39.4 % (ref 36.0–46.0)
Hemoglobin: 12.8 g/dL (ref 12.0–15.0)
MCH: 30.9 pg (ref 26.0–34.0)
MCHC: 32.5 g/dL (ref 30.0–36.0)
MCV: 95.2 fL (ref 80.0–100.0)
Platelets: 195 10*3/uL (ref 150–400)
RBC: 4.14 MIL/uL (ref 3.87–5.11)
RDW: 13.7 % (ref 11.5–15.5)
WBC: 4.2 10*3/uL (ref 4.0–10.5)
nRBC: 0 % (ref 0.0–0.2)

## 2018-06-11 SURGERY — DILATATION AND CURETTAGE /HYSTEROSCOPY
Anesthesia: General | Site: Vagina

## 2018-06-11 MED ORDER — LIDOCAINE 2% (20 MG/ML) 5 ML SYRINGE
INTRAMUSCULAR | Status: AC
  Filled 2018-06-11: qty 5

## 2018-06-11 MED ORDER — LACTATED RINGERS IV SOLN
INTRAVENOUS | Status: DC
  Administered 2018-06-11: 1000 mL via INTRAVENOUS

## 2018-06-11 MED ORDER — ONDANSETRON HCL 4 MG/2ML IJ SOLN
INTRAMUSCULAR | Status: DC | PRN
  Administered 2018-06-11: 4 mg via INTRAVENOUS

## 2018-06-11 MED ORDER — BUPIVACAINE HCL (PF) 0.5 % IJ SOLN
INTRAMUSCULAR | Status: AC
  Filled 2018-06-11: qty 30

## 2018-06-11 MED ORDER — IBUPROFEN 600 MG PO TABS
600.0000 mg | ORAL_TABLET | Freq: Once | ORAL | Status: DC
  Filled 2018-06-11: qty 1

## 2018-06-11 MED ORDER — DEXAMETHASONE SODIUM PHOSPHATE 10 MG/ML IJ SOLN
INTRAMUSCULAR | Status: AC
  Filled 2018-06-11: qty 1

## 2018-06-11 MED ORDER — PROPOFOL 10 MG/ML IV BOLUS
INTRAVENOUS | Status: AC
  Filled 2018-06-11: qty 20

## 2018-06-11 MED ORDER — DEXTROSE-NACL 5-0.45 % IV SOLN: INTRAVENOUS | Status: DC

## 2018-06-11 MED ORDER — FENTANYL CITRATE (PF) 100 MCG/2ML IJ SOLN
25.0000 ug | INTRAMUSCULAR | Status: DC | PRN
  Administered 2018-06-11: 75 ug via INTRAVENOUS
  Administered 2018-06-11 (×3): 25 ug via INTRAVENOUS

## 2018-06-11 MED ORDER — PROPOFOL 10 MG/ML IV BOLUS
INTRAVENOUS | Status: DC | PRN
  Administered 2018-06-11: 150 mg via INTRAVENOUS

## 2018-06-11 MED ORDER — IBUPROFEN 600 MG PO TABS: 600.0000 mg | ORAL_TABLET | Freq: Four times a day (QID) | ORAL | 0 refills | Status: DC | PRN

## 2018-06-11 MED ORDER — FENTANYL CITRATE (PF) 250 MCG/5ML IJ SOLN
INTRAMUSCULAR | Status: AC
  Filled 2018-06-11: qty 5

## 2018-06-11 MED ORDER — ONDANSETRON HCL 4 MG/2ML IJ SOLN: 4.0000 mg | Freq: Once | INTRAMUSCULAR | Status: DC | PRN

## 2018-06-11 MED ORDER — OXYCODONE HCL 5 MG/5ML PO SOLN: 5.0000 mg | Freq: Once | ORAL | Status: AC | PRN

## 2018-06-11 MED ORDER — FENTANYL CITRATE (PF) 100 MCG/2ML IJ SOLN
INTRAMUSCULAR | Status: DC | PRN
  Administered 2018-06-11 (×2): 50 ug via INTRAVENOUS

## 2018-06-11 MED ORDER — KETOROLAC TROMETHAMINE 15 MG/ML IJ SOLN
INTRAMUSCULAR | Status: DC | PRN
  Administered 2018-06-11: 15 mg via INTRAVENOUS

## 2018-06-11 MED ORDER — HYDROMORPHONE HCL 1 MG/ML IJ SOLN
0.2500 mg | INTRAMUSCULAR | Status: DC | PRN
  Administered 2018-06-11: 0.5 mg via INTRAVENOUS

## 2018-06-11 MED ORDER — HYDROMORPHONE HCL 1 MG/ML IJ SOLN
INTRAMUSCULAR | Status: AC
  Filled 2018-06-11: qty 1

## 2018-06-11 MED ORDER — MEPERIDINE HCL 25 MG/ML IJ SOLN: 6.2500 mg | INTRAMUSCULAR | Status: DC | PRN

## 2018-06-11 MED ORDER — OXYCODONE HCL 5 MG PO TABS
ORAL_TABLET | ORAL | Status: AC
  Filled 2018-06-11: qty 1

## 2018-06-11 MED ORDER — ONDANSETRON HCL 4 MG/2ML IJ SOLN
INTRAMUSCULAR | Status: AC
  Filled 2018-06-11: qty 2

## 2018-06-11 MED ORDER — OXYCODONE HCL 5 MG PO TABS
5.0000 mg | ORAL_TABLET | Freq: Once | ORAL | Status: AC | PRN
  Administered 2018-06-11: 5 mg via ORAL

## 2018-06-11 MED ORDER — FENTANYL CITRATE (PF) 100 MCG/2ML IJ SOLN
INTRAMUSCULAR | Status: AC
  Filled 2018-06-11: qty 2

## 2018-06-11 MED ORDER — LIDOCAINE 2% (20 MG/ML) 5 ML SYRINGE
INTRAMUSCULAR | Status: DC | PRN
  Administered 2018-06-11: 100 mg via INTRAVENOUS

## 2018-06-11 MED ORDER — DEXAMETHASONE SODIUM PHOSPHATE 4 MG/ML IJ SOLN
INTRAMUSCULAR | Status: DC | PRN
  Administered 2018-06-11: 5 mg via INTRAVENOUS

## 2018-06-11 MED ORDER — BUPIVACAINE HCL (PF) 0.5 % IJ SOLN
INTRAMUSCULAR | Status: DC | PRN
  Administered 2018-06-11: 20 mL

## 2018-06-11 SURGICAL SUPPLY — 13 items
CATH ROBINSON RED A/P 16FR (CATHETERS) ×2 IMPLANT
GLOVE BIO SURGEON STRL SZ7 (GLOVE) ×2 IMPLANT
GLOVE BIOGEL PI IND STRL 7.0 (GLOVE) ×2 IMPLANT
GLOVE BIOGEL PI INDICATOR 7.0 (GLOVE) ×2
GOWN STRL REUS W/ TWL LRG LVL3 (GOWN DISPOSABLE) ×1 IMPLANT
GOWN STRL REUS W/ TWL XL LVL3 (GOWN DISPOSABLE) ×1 IMPLANT
GOWN STRL REUS W/TWL LRG LVL3 (GOWN DISPOSABLE) ×1
GOWN STRL REUS W/TWL XL LVL3 (GOWN DISPOSABLE) ×1
HIBICLENS CHG 4% 4OZ BTL (MISCELLANEOUS) ×2 IMPLANT
KIT PROCEDURE FLUENT (KITS) ×2 IMPLANT
PACK VAGINAL MINOR WOMEN LF (CUSTOM PROCEDURE TRAY) ×2 IMPLANT
PAD OB MATERNITY 4.3X12.25 (PERSONAL CARE ITEMS) ×2 IMPLANT
TOWEL GREEN STERILE FF (TOWEL DISPOSABLE) ×4 IMPLANT

## 2018-06-11 NOTE — Progress Notes (Signed)
Wasted 1mg  dilaudid and 75mcg fentanyl in waste box / pt has been d/c'd out of system and unable to waste in pyxxis / witnessed by [employer] rn

## 2018-06-11 NOTE — Discharge Instructions (Signed)
Hysteroscopy, Care After  This sheet gives you information about how to care for yourself after your procedure. Your health care provider may also give you more specific instructions. If you have problems or questions, contact your health care provider.  What can I expect after the procedure?  After the procedure, it is common to have:  · Cramping.  · Bleeding. This can vary from light spotting to menstrual-like bleeding.  Follow these instructions at home:  Activity  · Rest for 1-2 days after the procedure.  · Do not douche, use tampons, or have sex for 2 weeks after the procedure, or until your health care provider approves.  · Do not drive for 24 hours after the procedure, or for as long as told by your health care provider.  · Do not drive, use heavy machinery, or drink alcohol while taking prescription pain medicines.  Medicines    · Take over-the-counter and prescription medicines only as told by your health care provider.  · Do not take aspirin during recovery. It can increase the risk of bleeding.  General instructions  · Do not take baths, swim, or use a hot tub until your health care provider approves. Take showers instead of baths for 2 weeks, or for as long as told by your health care provider.  · To prevent or treat constipation while you are taking prescription pain medicine, your health care provider may recommend that you:  ? Drink enough fluid to keep your urine clear or pale yellow.  ? Take over-the-counter or prescription medicines.  ? Eat foods that are high in fiber, such as fresh fruits and vegetables, whole grains, and beans.  ? Limit foods that are high in fat and processed sugars, such as fried and sweet foods.  · Keep all follow-up visits as told by your health care provider. This is important.  Contact a health care provider if:  · You feel dizzy or lightheaded.  · You feel nauseous.  · You have abnormal vaginal discharge.  · You have a rash.  · You have pain that does not get better with  medicine.  · You have chills.  Get help right away if:  · You have bleeding that is heavier than a normal menstrual period.  · You have a fever.  · You have pain or cramps that get worse.  · You develop new abdominal pain.  · You faint.  · You have pain in your shoulders.  · You have shortness of breath.  Summary  · After the procedure, you may have cramping and some vaginal bleeding.  · Do not douche, use tampons, or have sex for 2 weeks after the procedure, or until your health care provider approves.  · Do not take baths, swim, or use a hot tub until your health care provider approves. Take showers instead of baths for 2 weeks, or for as long as told by your health care provider.  · Report any unusual symptoms to your health care provider.  · Keep all follow-up visits as told by your health care provider. This is important.  This information is not intended to replace advice given to you by your health care provider. Make sure you discuss any questions you have with your health care provider.  Document Released: 10/09/2012 Document Revised: 01/18/2016 Document Reviewed: 01/18/2016  Elsevier Interactive Patient Education © 2019 [employer]

## 2018-06-11 NOTE — Brief Op Note (Signed)
06/11/2018  1:12 PM  PATIENT:  Elizabeth Maldonado  73 y.o. female  PRE-OPERATIVE DIAGNOSIS:  PMB  POST-OPERATIVE DIAGNOSIS:  post menopausal bleeding; endometrial polyp  PROCEDURE:  Procedure(s): DILATATION AND CURETTAGE /HYSTEROSCOPY WITH MYOSURE POLYPECTOMY (N/A)  SURGEON:  Surgeon(s) and Role:    * Lavonia Drafts, MD - Primary  ANESTHESIA:   general and paracervical block  EBL:  20 mL   BLOOD ADMINISTERED:none  DRAINS: none   LOCAL MEDICATIONS USED:  MARCAINE     SPECIMEN:  Source of Specimen:  endometrial currettings   DISPOSITION OF SPECIMEN:  PATHOLOGY  COUNTS:  YES  TOURNIQUET:  * No tourniquets in log *  DICTATION: .Note written in EPIC  PLAN OF CARE: Discharge to home after PACU  PATIENT DISPOSITION:  PACU - hemodynamically stable.   Delay start of Pharmacological VTE agent (>24hrs) due to surgical blood loss or risk of bleeding: not applicable  Complications: none immediate  Elizabeth Maldonado L. Elizabeth Maldonado, M.D., Elizabeth Maldonado

## 2018-06-11 NOTE — Anesthesia Procedure Notes (Signed)
Procedure Name: LMA Insertion Date/Time: 06/11/2018 12:09 PM Performed by: Alain Marion, CRNA Pre-anesthesia Checklist: Patient identified, Emergency Drugs available, Suction available and Patient being monitored Patient Re-evaluated:Patient Re-evaluated prior to induction Oxygen Delivery Method: Circle System Utilized Preoxygenation: Pre-oxygenation with 100% oxygen Induction Type: IV induction LMA: LMA inserted LMA Size: 4.0 Number of attempts: 1 Airway Equipment and Method: Bite block Placement Confirmation: positive ETCO2 Tube secured with: Tape Dental Injury: Teeth and Oropharynx as per pre-operative assessment

## 2018-06-11 NOTE — Anesthesia Preprocedure Evaluation (Signed)
Anesthesia Evaluation  Patient identified by MRN, date of birth, ID band Patient awake    Reviewed: Allergy & Precautions, NPO status , Patient's Chart, lab work & pertinent test results  Airway Mallampati: II       Dental no notable dental hx. (+) Teeth Intact   Pulmonary neg pulmonary ROS,    Pulmonary exam normal breath sounds clear to auscultation       Cardiovascular negative cardio ROS Normal cardiovascular exam Rhythm:Regular Rate:Normal     Neuro/Psych negative neurological ROS  negative psych ROS   GI/Hepatic negative GI ROS, Neg liver ROS,   Endo/Other  Hypothyroidism   Renal/GU negative Renal ROS  negative genitourinary   Musculoskeletal negative musculoskeletal ROS (+)   Abdominal (+) + obese,   Peds  Hematology negative hematology ROS (+)   Anesthesia Other Findings   Reproductive/Obstetrics                             Anesthesia Physical Anesthesia Plan  ASA: II  Anesthesia Plan: General   Post-op Pain Management:    Induction: Intravenous  PONV Risk Score and Plan: 4 or greater and Ondansetron and Dexamethasone  Airway Management Planned: LMA  Additional Equipment:   Intra-op Plan:   Post-operative Plan: Extubation in OR  Informed Consent: I have reviewed the patients History and Physical, chart, labs and discussed the procedure including the risks, benefits and alternatives for the proposed anesthesia with the patient or authorized representative who has indicated his/her understanding and acceptance.     Dental advisory given  Plan Discussed with: CRNA  Anesthesia Plan Comments:         Anesthesia Quick Evaluation

## 2018-06-11 NOTE — Transfer of Care (Signed)
Immediate Anesthesia Transfer of Care Note  Patient: Elizabeth Maldonado    Procedure(s) Performed: DILATATION AND CURETTAGE /HYSTEROSCOPY WITH MYOSURE POLYPECTOMY (N/A Vagina )  Patient Location: PACU  Anesthesia Type:General  Level of Consciousness: awake, alert  and oriented  Airway & Oxygen Therapy: Patient Spontanous Breathing and Patient connected to face mask oxygen  Post-op Assessment: Report given to RN and Post -op Vital signs reviewed and stable  Post vital signs: Reviewed and stable  Last Vitals:  Vitals Value Taken Time  BP 150/76   Temp    Pulse 78   Resp 11   SpO2 100     Last Pain:  Vitals:   06/11/18 1057  TempSrc:   PainSc: 0-No pain      Patients Stated Pain Goal: 3 ([id number])  Complications: No apparent anesthesia complications

## 2018-06-11 NOTE — H&P (Signed)
Elizabeth Maldonado is a 73 y.o. female here for a routine exam.  LMP >30 year prev. G1P1 s/p c/s.  Current complaints: post menopausal bleeding.  Pt reports that she had 3 days of bleeding. The initial blood was bright red followed by a day of darker blood. This occurred late last month and has not recurred.Pt denies assoc sx. No unexplained weight loss or constitutional sx.  Pt recently has sx of a yeast infxn and started OTC meds last night.      Gynecologic History No LMP recorded. Patient is postmenopausal. Contraception: post menopausal status Last Pap: 01/13/2016 Results were: normal Last mammogram: annually in Jan at [facility] History                 OB History  Gravida Para Term Preterm AB Living  1 1 1     1   SAB TAB Ectopic Multiple Live Births             1       # Outcome Date GA Lbr Len/2nd Weight Sex Delivery Anes PTL Lv  1 Term      CS-LVertical      The following portions of the patient's history were reviewed and updated as appropriate: allergies, current medications, past family history, past medical history, past social history, past surgical history and problem list.  Review of Systems Pertinent items are noted in HPI.   Objective:  BP (!) 142/53   Pulse 76   Ht 5' 2.5" (1.588 m)   Wt 208 lb (94.3 kg)   BMI 37.44 kg/m   CONSTITUTIONAL: Well-developed, well-nourished female in no acute distress.  HENT:  Normocephalic, atraumatic EYES: Conjunctivae and EOM are normal. No scleral icterus.  NECK: Normal range of motion SKIN: Skin is warm and dry. No rash noted. Not diaphoretic.No pallor. [place]: Alert and oriented to person, place, and time. Normal coordination.  GU: EGBUS: the upper thigh has a red rash with satellite lesions c/w a candidal infxn.  Vagina: no blood in vault Cervix: no lesion; no mucopurulent d/c Uterus: small, mobile. Difficult to fully assess due to body habitus.  Adnexa: no masses; non tender   GYN procedure/ Endo  bx: The indications for endometrial biopsy were reviewed. Risks of the biopsy including cramping, bleeding, infection, uterine perforation, inadequate specimen and need for additional procedures were discussed. The patient states she understands and agrees to undergo procedure today. Consent was signed. Time out was performed. A sterile speculum was placed in the patient's vagina and the cervix was prepped with Betadine. A single-toothed tenaculum was placed on the anterior lip of the cervix to stabilize it. An attempt was made to insert a 3 mm Pipelle into the endometrial cavity without success. A small dilator was attempted but, the patient was not able to tolerate the procedure. The patient did not tolerate the procedure and it was, therefore, aborted. The instruments were removed from the patient's vagina.     Assessment:  PMPB- needs sampling of the endometrial and [place].   Yeast vulvo vaginitis    Plan:  Postmenopausal bleeding - Plan: US Transvaginal Non-OB, CANCELED: US Pelvis Complete  Yeast vaginitis - Plan: terconazole (TERAZOL 7) 0.4 % vaginal cream   Addendum: 06/05/2018  CLINICAL DATA: Postmenopausal bleeding on May 9 through May 13, 2018  EXAM: ULTRASOUND PELVIS TRANSVAGINAL  TECHNIQUE: Transvaginal ultrasound examination of the pelvis was performed including evaluation of the uterus, ovaries, adnexal regions, and pelvic cul-de-sac.  COMPARISON: CT abdomen and pelvis  07/14/2017  FINDINGS: Uterus  Measurements: 6.5 x 4.0 x 4.6 cm = volume: 62 mL. Myometrial thinning. No focal mass. Small nabothian cysts at cervix.  Endometrium  Thickness: 24 mm, abnormally thickened. Markedly heterogeneous echogenicity of the endometrial complex, with multiple cystic areas identified at especially the mid to lower uterine segments.  Right ovary  Not visualized on either transabdominal or endovaginal imaging, likely obscured by bowel  Left ovary  Not  visualized on either transabdominal or endovaginal imaging, likely obscured by bowel  Other findings: No free pelvic fluid. No adnexal masses.  IMPRESSION: Nonvisualization of ovaries.  Abnormal markedly thickened and heterogeneous appearing endometrial complex 24 mm thick.  In the setting of post-menopausal bleeding, endometrial sampling is indicated to exclude carcinoma. If results are benign, sonohysterogram should be considered for focal lesion work-up. (Ref: Radiological Reasoning: Algorithmic Workup of Abnormal Vaginal Bleeding with Endovaginal Sonography and Sonohysterography. AJR 2008; 161:W96-04)  These results will be called to the ordering clinician or representative by the Radiologist Assistant, and communication documented in the PACS or zVision Dashboard.    I called the patient with the results of the imaging. I have shared with her the Ddx includes endometrial cancer and a polyp.   Patient desires surgical management with hysteroscopy with dilation and curettage.  The risks of surgery were discussed in detail with the patient including but not limited to: bleeding which may require transfusion or reoperation; infection which may require prolonged hospitalization or re-hospitalization and antibiotic therapy; injury to bowel, bladder, ureters and major vessels or other surrounding organs; need for additional procedures including laparotomy; thromboembolic phenomenon, incisional problems and other postoperative or anesthesia complications.  Patient was told that the likelihood that her condition and symptoms will be treated effectively with this surgical management was very high; the postoperative expectations were also discussed in detail. The patient also understands the alternative treatment options which were discussed in full. All questions were answered. Proceed to OR when ready.   Damico Partin L. Harraway-Smith, M.D., Cherlynn June

## 2018-06-11 NOTE — Op Note (Signed)
06/11/2018  1:12 PM  PATIENT:  Elizabeth Maldonado  73 y.o. female  PRE-OPERATIVE DIAGNOSIS:  PMB  POST-OPERATIVE DIAGNOSIS:  post menopausal bleeding; endometrial polyp  PROCEDURE:  Procedure(s): DILATATION AND CURETTAGE /HYSTEROSCOPY WITH MYOSURE POLYPECTOMY (N/A)  SURGEON:  Surgeon(s) and Role:    * Lavonia Drafts, MD - Primary  ANESTHESIA:   general and paracervical block  EBL:  20 mL   BLOOD ADMINISTERED:none  DRAINS: none   LOCAL MEDICATIONS USED:  MARCAINE     SPECIMEN:  Source of Specimen:  endometrial currettings   DISPOSITION OF SPECIMEN:  PATHOLOGY  COUNTS:  YES  TOURNIQUET:  * No tourniquets in log *  DICTATION: .Note written in EPIC  PLAN OF CARE: Discharge to home after PACU  PATIENT DISPOSITION:  PACU - hemodynamically stable.   Delay start of Pharmacological VTE agent (>24hrs) due to surgical blood loss or risk of bleeding: not applicable  Complications: none immediate  Findings: large endometrial polyp noted filling endometrial cavity. The endometrium post removal of the polyp was atrophic; no additional lesions noted.     PROCEDURE DETAILS:  The patient was taken to the operating room where general anesthesia was administered and was found to be adequate.  After an adequate timeout was performed, she was placed in the dorsal lithotomy position and examined; then prepped and draped in the sterile manner.   Her bladder was catheterized for an unmeasured amount of clear, yellow urine. A speculum was then placed in the patient's vagina and a single tooth tenaculum was applied to the anterior lip of the cervix.   A paracervical block using 20 ml of 0.5% Marcaine was administered.  The cervix was dilated manually with metal dilators to accommodate the 63mm hysteroscope. The above findings were noted. A sharp curettage was then performed to obtain a moderate amount of endometrial curettings.  An attempt was made to grasp the polyp using polyp forceps without  success. At this point the cervix as further dilated to accommodate the Myosure operative hysteroscope.  Once the cervix was dilated, the hysteroscope was inserted under direct visualization using LR as a suspension medium.  The uterine cavity was carefully examined, both ostia were recognized, and an atrophic endometrium with the polyp as noted above. This was resected using the Myosure device.  After further careful visualization of the uterine cavity, the hysteroscope was removed under direct visualization.  The tenaculum was removed from the anterior lip of the cervix and the vaginal speculum was removed after noting good hemostasis.  The patient tolerated the procedure well and was taken to the recovery area awake, extubated and in stable condition.  Myeisha Kruser L. Harraway-Smith, M.D., Cherlynn June

## 2018-06-12 ENCOUNTER — Encounter (HOSPITAL_COMMUNITY): Payer: Self-pay | Admitting: Obstetrics & Gynecology

## 2018-06-12 NOTE — Anesthesia Postprocedure Evaluation (Signed)
Anesthesia Post Note  Patient: Elizabeth Maldonado  Procedure(s) Performed: DILATATION AND CURETTAGE /HYSTEROSCOPY WITH MYOSURE POLYPECTOMY (N/A Vagina )     Patient location during evaluation: PACU Anesthesia Type: General Level of consciousness: awake Pain management: pain level controlled Vital Signs Assessment: post-procedure vital signs reviewed and stable Respiratory status: spontaneous breathing Cardiovascular status: stable Postop Assessment: no apparent nausea or vomiting Anesthetic complications: no    Last Vitals:  Vitals:   06/11/18 1331 06/11/18 1407  BP:  (!) 154/70  Pulse: 70 72  Resp: 17 16  Temp:  (!) 36.4 C  SpO2:  97%    Last Pain:  Vitals:   06/11/18 1250  TempSrc:   PainSc: 5    Pain Goal: Patients Stated Pain Goal: 3 (06/11/18 1057)                 [name]

## 2018-06-13 ENCOUNTER — Ambulatory Visit: Payer: Medicare Other | Admitting: Obstetrics & Gynecology

## 2018-06-19 ENCOUNTER — Ambulatory Visit (INDEPENDENT_AMBULATORY_CARE_PROVIDER_SITE_OTHER): Payer: Medicare Other | Admitting: Obstetrics & Gynecology

## 2018-06-19 ENCOUNTER — Ambulatory Visit: Payer: Medicare Other | Admitting: Obstetrics & Gynecology

## 2018-06-19 ENCOUNTER — Encounter: Payer: Self-pay | Admitting: Obstetrics & Gynecology

## 2018-06-19 ENCOUNTER — Other Ambulatory Visit: Payer: Self-pay

## 2018-06-19 VITALS — BP 151/53 | HR 63 | Ht 62.5 in | Wt 213.0 lb

## 2018-06-19 DIAGNOSIS — N950 Postmenopausal bleeding: Secondary | ICD-10-CM | POA: Diagnosis not present

## 2018-06-19 DIAGNOSIS — C541 Malignant neoplasm of endometrium: Secondary | ICD-10-CM

## 2018-06-19 DIAGNOSIS — Z9889 Other specified postprocedural states: Secondary | ICD-10-CM | POA: Diagnosis not present

## 2018-06-19 DIAGNOSIS — N840 Polyp of corpus uteri: Secondary | ICD-10-CM | POA: Diagnosis not present

## 2018-06-19 NOTE — Patient Instructions (Signed)
Uterine Cancer  Uterine cancer is an abnormal growth of cancer tissue (malignant tumor) in the uterus. Unlike noncancerous (benign) tumors, malignant tumors can spread to other parts of the body. Uterine cancer usually occurs after menopause. However, it may also occur around the time that menopause begins. The wall of the uterus has an inner layer of tissue (endometrium) and an outer layer of muscle tissue (myometrium). The most common type of uterine cancer begins in the endometrium (endometrial cancer). Cancer that begins in the myometrium (uterine sarcoma) is very rare. What are the causes? The exact cause of this condition is not known. What increases the risk? You are more likely to develop this condition if you:  Are older than 50.  Have an enlarged endometrium (endometrial hyperplasia).  Use hormone therapy.  Are severely overweight (obese).  Use the medicine tamoxifen.  You are white (Caucasian).  Cannot bear children (are infertile).  Have never been pregnant.  Started menstruating at an age younger than 12 years.  Are older than 52 and are still having menstrual periods.  Have a history of cancer of the ovaries, intestines, or colon or rectum (colorectal cancer).  Have a history of enlarged ovaries with small cysts (polycystic ovarian syndrome).  Have a family history of: ? Uterine cancer. ? Hereditary nonpolyposis colon cancer (HNPCC).  Have diabetes, high blood pressure, thyroid disease, or gallbladder disease.  Use long-term, high-dose birth control pills.  Have been exposed to radiation.  Smoke. What are the signs or symptoms? Symptoms of this condition include:  Abnormal vaginal bleeding or discharge. Bleeding may start as a watery, blood-streaked flow that gradually contains more blood. This is the most common symptom. If you experience abnormal vaginal bleeding, do not assume that it is part of menopause.  Vaginal bleeding after menopause.   Unexplained weight loss.  Bleeding between periods.  Urination that is difficult, painful, or more frequent than usual.  A lump (mass) in the vagina.  Pain, bloating, or fullness in the abdomen.  Pain in the pelvic area.  Pain during sex. How is this diagnosed? This condition may be diagnosed based on:  Your medical history and your symptoms.  A physical and pelvic exam. Your health care provider will feel your pelvis for any growths or enlarged lymph nodes.  Blood and urine tests.  Imaging tests, such as X-rays, CT scans, ultrasound, or MRIs.  A procedure in which a thin, flexible tube with a light and camera on the end is inserted through the vagina and used to look inside the uterus (hysteroscopy).  A Pap test to check for abnormal cells in the lower part of the uterus (cervix) and the upper vagina.  Removing a tissue sample (biopsy) from the uterine lining to check for cancer cells.  Dilation and curettage (D&C). This is a procedure that involves stretching (dilation) the cervix and scraping (curettage) the inside lining of the uterus to get a biopsy and check for cancer cells. Your cancer will be staged to determine its severity and extent. Staging is an assessment of:  The size of the tumor.  Whether the cancer has spread.  Where the cancer has spread. The stages of uterine cancer are as follows:  Stage I. The cancer is only found in the uterus.  Stage II. The cancer has spread to the cervix.  Stage III. The cancer has spread outside the uterus, but not outside the pelvis. The cancer may have spread to the lymph nodes in the pelvis. Lymph nodes are  part of your body's disease-fighting (immune) system. Lymph nodes are found in many locations in your body, including the neck, underarm, and groin.  Stage IV. The cancer has spread to other parts of the body, such as the bladder or rectum. How is this treated? This condition is often treated with surgery to remove:   The uterus, cervix, fallopian tubes, and ovaries (total hysterectomy).  The uterus and cervix (simple hysterectomy). The type of hysterectomy you will have depends on the extent of your cancer. Lymph nodes near the uterus may also be removed in some cases. Treatment may also include one or more of the following:  Chemotherapy. This uses medicines to kill the cancer cells and prevent their spread.  Radiation therapy. This uses high-energy rays to kill the cancer cells and prevent the spread of cancer.  Chemoradiation. This is a combination treatment that alternates chemotherapy with radiation treatments to enhance the way radiation works.  Brachytherapy. This involves placing radioactive materials inside the body where the cancer was removed.  Hormone therapy. This includes taking medicines that lower the levels of estrogen in the body. Follow these instructions at home: Activity  Return to your normal activities as told by your health care provider. Ask your health care provider what activities are safe for you.  Exercise regularly as told by your health care provider.  Do not drive or use heavy machinery while taking prescription pain medicine. General instructions  Take over-the-counter and prescription medicines only as told by your health care provider.  Maintain a healthy diet.  Work with your health care provider to: ? Manage any long-term (chronic) conditions you have, such as diabetes, high blood pressure, thyroid disease, or gallbladder disease. ? Manage any side effects of your treatment.  Do not use any products that contain nicotine or tobacco, such as cigarettes and e-cigarettes. If you need help quitting, ask your health care provider.  Consider joining a support group to help you cope with stress. Your health care provider may be able to recommend a local or online support group.  Keep all follow-up visits as told by your health care provider. This is important.  Where to find more information  American Cancer Society: https://www.cancer.[place]): https://www.cancer.gov Contact a health care provider if:  You have pain in your pelvis or abdomen that gets worse.  You cannot urinate.  You have abnormal bleeding.  You have a fever. Get help right away if:  You develop sudden or new severe symptoms, such as: ? Heavy bleeding. ? Severe weakness. ? Pain that is severe or does not get better with medicine. Summary  Uterine cancer is an abnormal growth of cancer tissue (malignant tumor) in the uterus. The most common type of uterine cancer begins in the endometrium (endometrial cancer).  This condition is often treated with surgery to remove the uterus, cervix, fallopian tubes, and ovaries (total hysterectomy) or the uterus and cervix (simple hysterectomy).  Work with your health care provider to manage any long-term (chronic) conditions you have, such as diabetes, high blood pressure, thyroid disease, or gallbladder disease.  Consider joining a support group to help you cope with stress. Your health care provider may be able to recommend a local or online support group. This information is not intended to replace advice given to you by your health care provider. Make sure you discuss any questions you have with your health care provider. Document Released: 12/19/2004 Document Revised: 12/17/2015 Document Reviewed: 12/17/2015 Elsevier Interactive Patient Education  2019 [place].

## 2018-06-19 NOTE — Progress Notes (Signed)
History:  73 y.o. G1P1001 here today for f/u surg path results. Pt reports that she still has bleeding post procedure. She denies pain. 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  The following portions of the patient's history were reviewed and updated as appropriate: allergies, current medications, past family history, past medical history, past social history, past surgical history and problem list.  Review of Systems:  Pertinent items are noted in HPI.    Objective:  Physical Exam Blood pressure (!) 151/53, pulse 63, height 5' 2.5" (1.588 m), weight 213 lb (96.6 kg).  CONSTITUTIONAL: Well-developed, well-nourished female in no acute distress.  HENT:  Normocephalic, atraumatic EYES: Conjunctivae and EOM are normal. No scleral icterus.  NECK: Normal range of motion SKIN: Skin is warm and dry. No rash noted. Not diaphoretic.No pallor. [place]: Alert and oriented to person, place, and time. Normal coordination.  Pelvic:not repeated  Labs and Imaging US Transvaginal Non-ob  Result Date: 06/05/2018 CLINICAL DATA:  Postmenopausal bleeding on May 9 through May 13, 2018 EXAM: ULTRASOUND PELVIS TRANSVAGINAL TECHNIQUE: Transvaginal ultrasound examination of the pelvis was performed including evaluation of the uterus, ovaries, adnexal regions, and pelvic cul-de-sac. COMPARISON:  CT abdomen and pelvis 07/14/2017 FINDINGS: Uterus Measurements: 6.5 x 4.0 x 4.6 cm = volume: 62 mL. Myometrial thinning. No focal mass. Small nabothian cysts at cervix. Endometrium Thickness: 24 mm, abnormally thickened. Markedly heterogeneous echogenicity of the endometrial complex, with multiple cystic areas identified at especially the mid to lower uterine segments. Right ovary Not visualized on either transabdominal or endovaginal imaging, likely obscured by bowel Left ovary Not visualized on either transabdominal or endovaginal imaging, likely obscured by bowel Other findings:  No free pelvic  fluid.  No adnexal masses. IMPRESSION: Nonvisualization of ovaries. Abnormal markedly thickened and heterogeneous appearing endometrial complex 24 mm thick. In the setting of post-menopausal bleeding, endometrial sampling is indicated to exclude carcinoma. If results are benign, sonohysterogram should be considered for focal lesion work-up. (Ref: Radiological Reasoning: Algorithmic Workup of Abnormal Vaginal Bleeding with Endovaginal Sonography and Sonohysterography. AJR 2008; 284:X32-44) These results will be called to the ordering clinician or representative by the Radiologist Assistant, and communication documented in the PACS or zVision Dashboard. Electronically Signed   By: Lavonia Dana M.D.   On: 06/05/2018 15:16   06/11/2018 Diagnosis Endometrium, curettage, and polyp - EXTENSIVE COMPLEX ATYPICAL HYPERPLASIA WITH FOCI SUSPICIOUS FOR WELL DIFFERENTIATED ENDOMETRIOID ADENOCARCINOMA.  Assessment & Plan:  Post op state-  Doing well post op. NO problems  Endometrial CA-   Pt notified of diagnosis and possible treatment options which would include surgery  and possibly radiation and chemo therapy.    We have offered her the appt with Dr. Denman George June 22nd. She was grateful for the info  and the referral. All of her questions were answered.   Cordell Guercio L. Harraway-Smith, M.D., [facility] June

## 2018-06-20 NOTE — Patient Instructions (Signed)
Preparing for your Surgery  Plan for surgery on July 11, 2018 with Dr. Everitt Amber at [place] will be scheduled for a robotic assisted total laparoscopic hysterectomy, bilateral salpingo-oophorectomy, sentinel lymph node biopsy.   Pre-operative Testing -You will receive a phone call from presurgical testing at [facility] if you have not received a call already to arrange for a pre-operative testing appointment before your surgery.  This appointment normally occurs one to two weeks before your scheduled surgery.   -Bring your insurance card, copy of an advanced directive if applicable, medication list  -At that visit, you will be asked to sign a consent for a possible blood transfusion in case a transfusion becomes necessary during surgery.  The need for a blood transfusion is rare but having consent is a necessary part of your care.     -You should not be taking blood thinners or aspirin at least ten days prior to surgery unless instructed by your surgeon.  -Do not take supplements such as fish oil (omega 3), red yeast rice, tumeric before your surgery.  Day Before Surgery at [place] will be asked to take in a light diet the day before surgery.  Avoid carbonated beverages.  You will be advised to have nothing to eat or drink after midnight the evening before.    Eat a light diet the day before surgery.  Examples including soups, broths, toast, yogurt, mashed potatoes.  Things to avoid include carbonated beverages (fizzy beverages), raw fruits and raw vegetables, or beans.   If your bowels are filled with gas, your surgeon will have difficulty visualizing your pelvic organs which increases your surgical risks.  Your role in recovery Your role is to become active as soon as directed by your doctor, while still giving yourself time to heal.  Rest when you feel tired. You will be asked to do the following in order to speed your recovery:  - Cough and breathe deeply.  This helps toclear and expand your lungs and can prevent pneumonia. You may be given a spirometer to practice deep breathing. A staff member will show you how to use the spirometer. - Do mild physical activity. Walking or moving your legs help your circulation and body functions return to normal. A staff member will help you when you try to walk and will provide you with simple exercises. Do not try to get up or walk alone the first time. - Actively manage your pain. Managing your pain lets you move in comfort. We will ask you to rate your pain on a scale of zero to 10. It is your responsibility to tell your doctor or nurse where and how much you hurt so your pain can be treated.  Special Considerations -If you are diabetic, you may be placed on insulin after surgery to have closer control over your blood sugars to promote healing and recovery.  This does not mean that you will be discharged on insulin.  If applicable, your oral antidiabetics will be resumed when you are tolerating a solid diet.  -Your final pathology results from surgery should be available around one week after surgery and the results will be relayed to you when available.  -Dr. Lahoma Crocker is the surgeon that assists your GYN Oncologist with surgery.  If you end up staying the night, the next day after your surgery you will either see Dr. Denman George or Dr. Lahoma Crocker.  -FMLA forms can be faxed to [phone] and please allow  5-7 business days for completion.  Pain Management After Surgery -You will be prescribed your pain medication and bowel regimen medications before surgery so that you can have these available when you are discharged from the hospital. The pain medication is for use ONLY AFTER surgery and a new prescription will not be given.   -Make sure that you have Ibuprofen at home to use on a regular basis after surgery for pain control if you are able to take this.   -Review the attached handout on narcotic  use and their risks and side effects.   Bowel Regimen -You have been prescribed Sennakot-S to take nightly to prevent constipation especially if you are taking the narcotic pain medication intermittently.  It is important to prevent constipation and drink adequate amounts of liquids.  Blood Transfusion Information WHAT IS A BLOOD TRANSFUSION? A transfusion is the replacement of blood or some of its parts. Blood is made up of multiple cells which provide different functions.  Red blood cells carry oxygen and are used for blood loss replacement.  White blood cells fight against infection.  Platelets control bleeding.  Plasma helps clot blood.  Other blood products are available for specialized needs, such as hemophilia or other clotting disorders. BEFORE THE TRANSFUSION  Who gives blood for transfusions?   You may be able to donate blood to be used at a later date on yourself (autologous donation).  Relatives can be asked to donate blood. This is generally not any safer than if you have received blood from a stranger. The same precautions are taken to ensure safety when a relative's blood is donated.  Healthy volunteers who are fully evaluated to make sure their blood is safe. This is blood bank blood. Transfusion therapy is the safest it has ever been in the practice of medicine. Before blood is taken from a donor, a complete history is taken to make sure that person has no history of diseases nor engages in risky social behavior (examples are intravenous drug use or sexual activity with multiple partners). The donor's travel history is screened to minimize risk of transmitting infections, such as malaria. The donated blood is tested for signs of infectious diseases, such as HIV and hepatitis. The blood is then tested to be sure it is compatible with you in order to minimize the chance of a transfusion reaction. If you or a relative donates blood, this is often done in anticipation of  surgery and is not appropriate for emergency situations. It takes many days to process the donated blood. RISKS AND COMPLICATIONS Although transfusion therapy is very safe and saves many lives, the main dangers of transfusion include:   Getting an infectious disease.  Developing a transfusion reaction. This is an allergic reaction to something in the blood you were given. Every precaution is taken to prevent this. The decision to have a blood transfusion has been considered carefully by your caregiver before blood is given. Blood is not given unless the benefits outweigh the risks.  AFTER SURGERY CONSIDERATIONS 06/20/2018  Return to work: 4-6 weeks if applicable  Activity: 1. Be up and out of the bed during the day.  Take a nap if needed.  You may walk up steps but be careful and use the hand rail.  Stair climbing will tire you more than you think, you may need to stop part way and rest.   2. No lifting or straining for 6 weeks.  3. No driving for 1 week(s).  Do not drive  if you are taking narcotic pain medicine.  4. Shower daily.  Use soap and water on your incision and pat dry; don't rub.  No tub baths until cleared by your surgeon.   5. No sexual activity and nothing in the vagina for 8 weeks.  6. You may experience a small amount of clear drainage from your incisions, which is normal.  If the drainage persists or increases, please call the office.  7. You may experience vaginal spotting after surgery or around the 6-8 week mark from surgery when the stitches at the top of the vagina begin to dissolve.  The spotting is normal but if you experience heavy bleeding, call our office.  8. Take buprofen first for pain and only use narcotic pain medicine for severe pain not relieved by Ibuprofen.    Diet: 1. Low sodium Heart Healthy Diet is recommended.  2. It is safe to use a laxative, such as Miralax or Colace, if you have difficulty moving your bowels. You can take Sennakot at bedtime  every evening to keep bowel movements regular and to prevent constipation.    Wound Care: 1. Keep clean and dry.  Shower daily.  Reasons to call the Doctor:  Fever - Oral temperature greater than 100.4 degrees Fahrenheit  Foul-smelling vaginal discharge  Difficulty urinating  Nausea and vomiting  Increased pain at the site of the incision that is unrelieved with pain medicine.  Difficulty breathing with or without chest pain  New calf pain especially if only on one side  Sudden, continuing increased vaginal bleeding with or without clots.   Contacts:  After Hours for AFTER surgery: call [phone] and have the GYN Oncologist paged/contacted

## 2018-06-21 ENCOUNTER — Encounter: Payer: Self-pay | Admitting: Obstetrics & Gynecology

## 2018-06-24 ENCOUNTER — Encounter: Payer: Self-pay | Admitting: Gynecologic Oncology

## 2018-06-24 ENCOUNTER — Inpatient Hospital Stay: Payer: Medicare Other | Attending: Gynecologic Oncology | Admitting: Gynecologic Oncology

## 2018-06-24 ENCOUNTER — Other Ambulatory Visit: Payer: Self-pay

## 2018-06-24 VITALS — BP 131/56 | HR 71 | Temp 98.3°F | Resp 18 | Ht 62.5 in | Wt 211.2 lb

## 2018-06-24 DIAGNOSIS — E7800 Pure hypercholesterolemia, unspecified: Secondary | ICD-10-CM | POA: Insufficient documentation

## 2018-06-24 DIAGNOSIS — N8502 Endometrial intraepithelial neoplasia [EIN]: Secondary | ICD-10-CM

## 2018-06-24 DIAGNOSIS — Z6837 Body mass index (BMI) 37.0-37.9, adult: Secondary | ICD-10-CM | POA: Insufficient documentation

## 2018-06-24 DIAGNOSIS — C541 Malignant neoplasm of endometrium: Secondary | ICD-10-CM | POA: Diagnosis not present

## 2018-06-24 DIAGNOSIS — I10 Essential (primary) hypertension: Secondary | ICD-10-CM | POA: Insufficient documentation

## 2018-06-24 DIAGNOSIS — E669 Obesity, unspecified: Secondary | ICD-10-CM | POA: Diagnosis not present

## 2018-06-24 DIAGNOSIS — E039 Hypothyroidism, unspecified: Secondary | ICD-10-CM | POA: Insufficient documentation

## 2018-06-24 DIAGNOSIS — R9389 Abnormal findings on diagnostic imaging of other specified body structures: Secondary | ICD-10-CM

## 2018-06-24 NOTE — H&P (View-Only) (Signed)
Consult Note: Gyn-Onc  Consult was requested by Dr. Ihor Maldonado for the evaluation of Elizabeth Maldonado 73 y.o. female  CC:  Chief Complaint  Patient presents with  . Endometrial thickening on ultrasound  . Complex endometrial hyperplasia with atypia    Assessment/Plan:  [name]  is a 73 y.o.  year old with grade 1 endometrioid endometrial cancer.  A detailed discussion was held with the patient and her family with regard to to her endometrial cancer diagnosis. We discussed the standard management options for uterine cancer which includes surgery followed possibly by adjuvant therapy depending on the results of surgery. The options for surgical management include a hysterectomy and removal of the tubes and ovaries possibly with removal of pelvic and para-aortic lymph nodes.If feasible, a minimally invasive approach including a robotic hysterectomy or laparoscopic hysterectomy have benefits including shorter hospital stay, recovery time and better wound healing than with open surgery. The patient has been counseled about these surgical options and the risks of surgery in general including infection, bleeding, damage to surrounding structures (including bowel, bladder, ureters, nerves or vessels), and the postoperative risks of PE/ DVT, and lymphedema. I extensively reviewed the additional risks of robotic hysterectomy including possible need for conversion to open laparotomy.  I discussed positioning during surgery of trendelenberg and risks of minor facial swelling and care we take in preoperative positioning.  I discussed that her obesity places her at greater risk for these complications. I discussed that due to the narrowness of her postmenopausal vagina, she may require a minilaparotomy for specimen delivery.   After counseling and consideration of her options, she desires to proceed with robotic assisted total hysterectomy with bilateral sapingo-oophorectomy and SLN biopsy.   She will  be seen by anesthesia for preoperative clearance and discussion of postoperative pain management.  She was given the opportunity to ask questions, which were answered to her satisfaction, and she is agreement with the above mentioned plan of care.  HPI: [name] is a 73 year old P1 who is seen in consultation at the request of Dr Elizabeth Maldonado for a grade 1 endometrial adenocarcinoma.   Patient reports postmenopausal bleeding on Mother's Day of 2020.  This prompted her to discuss it with her primary care physician, Dr. Tamala Maldonado, followed by a consultation with Dr. Ihor Maldonado.  Dr. Ihor Maldonado performed a transvaginal ultrasound scan on June 05, 2018.  This revealed a uterus measuring 6.5 x 4 x 4.6 cm.  The endometrium measured 24 mm and contained a markedly heterogeneous endometrial complex with multiple cystic areas.  The left and right ovaries were not visualized.  An office sampling was attempted but was unsuccessful due to discomfort.  The patient was taken to the operating room on June 11, 2018 where a D&C was performed.  This was accompanied by a hysteroscopy and MyoSure polypectomy.  Intraoperative findings were significant for a large endometrial polyp.  Final pathology from this procedure revealed extensive complex atypical hyperplasia with foci suspicious for a well differentiated endometrioid adenocarcinoma.  The patient is obese with a BMI of 37.4 kg/m.  She is carries a diagnosis of hypertension, hypercholesterolemia, and hypothyroidism.  She has had 1 prior pregnancy with a cesarean delivery.  She has had a laparoscopic cholecystectomy and as a child an open appendectomy.  Menopause transitioned at age 58.  She reports that she was on hormone replacement therapy for about 5 years, she is unclear if this was unopposed estrogen hormone replacement therapy.  The patient's family history significant  for brother with prostate cancer, a brother with lymphoma, and a father who had lung  cancer but was a smoker.  The patient is a retired Associate Professor for high school.  She lives with her husband who is not of good underlying health.  Current Meds:  Outpatient Encounter Medications as of 06/24/2018  Medication Sig  . b complex vitamins tablet Take 1 tablet by mouth daily.  . Calcium Citrate-Vitamin D (CALCIUM + D PO) Take 1 tablet by mouth daily.  . Cholecalciferol (VITAMIN D) 125 MCG (5000 UT) CAPS Take 5,000 Units by mouth daily.  Marland Kitchen ibuprofen (ADVIL) 600 MG tablet Take 1 tablet (600 mg total) by mouth every 6 (six) hours as needed for moderate pain or cramping.  Marland Kitchen levothyroxine (SYNTHROID, LEVOTHROID) 75 MCG tablet Take 75 mcg by mouth daily.  . Multiple Vitamin (MULTIVITAMIN WITH MINERALS) TABS tablet Take 1 tablet by mouth daily.  . Tetrahydrozoline HCl (VISINE OP) Place 1 drop into both eyes daily as needed (irritation).  . vitamin C (ASCORBIC ACID) 500 MG tablet Take 500 mg by mouth daily.  . [DISCONTINUED] terconazole (TERAZOL 7) 0.4 % vaginal cream Place 1 applicator vaginally at bedtime.   No facility-administered encounter medications on file as of 06/24/2018.     Allergy:  Allergies  Allergen Reactions  . Crestor [Rosuvastatin] Other (See Comments)    myalgia  . Lipitor [Atorvastatin] Other (See Comments)    myalgia  . Acetaminophen Itching and Other (See Comments)    "hyperactivity"  . Vytorin [Ezetimibe-Simvastatin] Other (See Comments)    myalgia    Social Hx:   Social History   Socioeconomic History  . Marital status: Married    Spouse name: Not on file  . Number of children: Not on file  . Years of education: Not on file  . Highest education level: Not on file  Occupational History  . Not on file  Social Needs  . Financial resource strain: Not on file  . Food insecurity    Worry: Not on file    Inability: Not on file  . Transportation needs    Medical: Not on file    Non-medical: Not on file  Tobacco Use  . Smoking status: Never  Smoker  . Smokeless tobacco: Never Used  Substance and Sexual Activity  . Alcohol use: Yes    Comment: one to two beers a year  . Drug use: No  . Sexual activity: Not on file  Lifestyle  . Physical activity    Days per week: Not on file    Minutes per session: Not on file  . Stress: Not on file  Relationships  . Social Herbalist on phone: Not on file    Gets together: Not on file    Attends religious service: Not on file    Active member of club or organization: Not on file    Attends meetings of clubs or organizations: Not on file    Relationship status: Not on file  . Intimate partner violence    Fear of current or ex partner: Not on file    Emotionally abused: Not on file    Physically abused: Not on file    Forced sexual activity: Not on file  Other Topics Concern  . Not on file  Social History Narrative  . Not on file    Past Surgical Hx:  Past Surgical History:  Procedure Laterality Date  . APPENDECTOMY    . BUNIONECTOMY    .  CATARACT EXTRACTION    . CESAREAN SECTION    . CHOLECYSTECTOMY    . FOOT SURGERY Right 2011   bone graft   . HYSTEROSCOPY W/D&C N/A 06/11/2018   Procedure: DILATATION AND CURETTAGE /HYSTEROSCOPY WITH MYOSURE POLYPECTOMY;  Surgeon: Lavonia Drafts, MD;  Location: [facility];  Service: Gynecology;  Laterality: N/A;  . NASAL SEPTUM SURGERY     at age 47  . TONSILLECTOMY      Past Medical Hx:  Past Medical History:  Diagnosis Date  . Diverticulosis   . Hearing loss    wears hearing aids  . History of anemia as a child   . Hypercholesteremia    diet controlled  . Hyperlipidemia 02/16/2016  . Hypothyroid   . Hypothyroidism 02/16/2016    Past Gynecological History:  C/s x 1, menopause age 73. No LMP recorded (lmp unknown). Patient is postmenopausal.  Family Hx:  Family History  Problem Relation Age of Onset  . Hypertension Mother   . Heart attack Mother   . Heart attack Father   . Lung cancer Father   . Diabetes  Sister   . Cervical cancer Sister   . Skin cancer Sister   . Diabetes Brother   . CAD Brother   . Lymphoma Brother   . Skin cancer Brother   . Diabetes Brother   . Skin cancer Brother   . Skin cancer Brother   . Prostate cancer Brother   . Diverticulitis Brother   . Skin cancer Brother   . Skin cancer Sister     Review of Systems:  Constitutional  Feels well,    ENT Normal appearing ears and nares bilaterally Skin/Breast  No rash, sores, jaundice, itching, dryness Cardiovascular  No chest pain, shortness of breath, or edema  Pulmonary  No cough or wheeze.  Gastro Intestinal  No nausea, vomitting, or diarrhoea. No bright red blood per rectum, no abdominal pain, change in bowel movement, or constipation.  Genito Urinary  No frequency, urgency, dysuria, + postmenopausal bleeding  Musculo Skeletal  No myalgia, arthralgia, joint swelling or pain  Neurologic  No weakness, numbness, change in gait,  Psychology  No depression, anxiety, insomnia.   Vitals:  Blood pressure (!) 131/56, pulse 71, temperature 98.3 F (36.8 C), temperature source Oral, resp. rate 18, height 5' 2.5" (1.588 m), weight 211 lb 3.2 oz (95.8 kg), SpO2 99 %.  Physical Exam: WD in NAD Neck  Supple NROM, without any enlargements.  Lymph Node Survey No cervical supraclavicular or inguinal adenopathy Cardiovascular  Pulse normal rate, regularity and rhythm. S1 and S2 normal.  Lungs  Clear to auscultation bilateraly, without wheezes/crackles/rhonchi. Good air movement.  Skin  No rash/lesions/breakdown  Psychiatry  Alert and oriented to person, place, and time  Abdomen  Normoactive bowel sounds, abdomen soft, non-tender and obese without evidence of hernia.  Back No CVA tenderness Genito Urinary  Vulva/vagina: Normal external female genitalia.  No lesions. No discharge or bleeding.  Bladder/urethra:  No lesions or masses, well supported bladder  Vagina: normal, narrow  Cervix: Normal appearing,  no lesions.  Uterus:  Small, mobile, no parametrial involvement or nodularity.  Adnexa: no discretely palpable masses. Rectal  deferred Extremities  No bilateral cyanosis, clubbing or edema.   Thereasa Solo, MD  06/24/2018, 1:30 PM

## 2018-06-24 NOTE — H&P (View-Only) (Signed)
Consult Note: Gyn-Onc  Consult was requested by Dr. Ihor Dow for the evaluation of Elizabeth Maldonado 73 y.o. female  CC:  Chief Complaint  Patient presents with  . Endometrial thickening on ultrasound  . Complex endometrial hyperplasia with atypia    Assessment/Plan:  [name]  is a 73 y.o.  year old with grade 1 endometrioid endometrial cancer.  A detailed discussion was held with the patient and her family with regard to to her endometrial cancer diagnosis. We discussed the standard management options for uterine cancer which includes surgery followed possibly by adjuvant therapy depending on the results of surgery. The options for surgical management include a hysterectomy and removal of the tubes and ovaries possibly with removal of pelvic and para-aortic lymph nodes.If feasible, a minimally invasive approach including a robotic hysterectomy or laparoscopic hysterectomy have benefits including shorter hospital stay, recovery time and better wound healing than with open surgery. The patient has been counseled about these surgical options and the risks of surgery in general including infection, bleeding, damage to surrounding structures (including bowel, bladder, ureters, nerves or vessels), and the postoperative risks of PE/ DVT, and lymphedema. I extensively reviewed the additional risks of robotic hysterectomy including possible need for conversion to open laparotomy.  I discussed positioning during surgery of trendelenberg and risks of minor facial swelling and care we take in preoperative positioning.  I discussed that her obesity places her at greater risk for these complications. I discussed that due to the narrowness of her postmenopausal vagina, she may require a minilaparotomy for specimen delivery.   After counseling and consideration of her options, she desires to proceed with robotic assisted total hysterectomy with bilateral sapingo-oophorectomy and SLN biopsy.   She will  be seen by anesthesia for preoperative clearance and discussion of postoperative pain management.  She was given the opportunity to ask questions, which were answered to her satisfaction, and she is agreement with the above mentioned plan of care.  HPI: [name] is a 73 year old P1 who is seen in consultation at the request of Dr Ihor Dow for a grade 1 endometrial adenocarcinoma.   Patient reports postmenopausal bleeding on Mother's Day of 2020.  This prompted her to discuss it with her primary care physician, Dr. Tamala Julian, followed by a consultation with Dr. Ihor Dow.  Dr. Ihor Dow performed a transvaginal ultrasound scan on June 05, 2018.  This revealed a uterus measuring 6.5 x 4 x 4.6 cm.  The endometrium measured 24 mm and contained a markedly heterogeneous endometrial complex with multiple cystic areas.  The left and right ovaries were not visualized.  An office sampling was attempted but was unsuccessful due to discomfort.  The patient was taken to the operating room on June 11, 2018 where a D&C was performed.  This was accompanied by a hysteroscopy and MyoSure polypectomy.  Intraoperative findings were significant for a large endometrial polyp.  Final pathology from this procedure revealed extensive complex atypical hyperplasia with foci suspicious for a well differentiated endometrioid adenocarcinoma.  The patient is obese with a BMI of 37.4 kg/m.  She is carries a diagnosis of hypertension, hypercholesterolemia, and hypothyroidism.  She has had 1 prior pregnancy with a cesarean delivery.  She has had a laparoscopic cholecystectomy and as a child an open appendectomy.  Menopause transitioned at age 73.  She reports that she was on hormone replacement therapy for about 5 years, she is unclear if this was unopposed estrogen hormone replacement therapy.  The patient's family history significant  for brother with prostate cancer, a brother with lymphoma, and a father who had lung  cancer but was a smoker.  The patient is a retired Associate Professor for high school.  She lives with her husband who is not of good underlying health.  Current Meds:  Outpatient Encounter Medications as of 06/24/2018  Medication Sig  . b complex vitamins tablet Take 1 tablet by mouth daily.  . Calcium Citrate-Vitamin D (CALCIUM + D PO) Take 1 tablet by mouth daily.  . Cholecalciferol (VITAMIN D) 125 MCG (5000 UT) CAPS Take 5,000 Units by mouth daily.  Marland Kitchen ibuprofen (ADVIL) 600 MG tablet Take 1 tablet (600 mg total) by mouth every 6 (six) hours as needed for moderate pain or cramping.  Marland Kitchen levothyroxine (SYNTHROID, LEVOTHROID) 75 MCG tablet Take 75 mcg by mouth daily.  . Multiple Vitamin (MULTIVITAMIN WITH MINERALS) TABS tablet Take 1 tablet by mouth daily.  . Tetrahydrozoline HCl (VISINE OP) Place 1 drop into both eyes daily as needed (irritation).  . vitamin C (ASCORBIC ACID) 500 MG tablet Take 500 mg by mouth daily.  . [DISCONTINUED] terconazole (TERAZOL 7) 0.4 % vaginal cream Place 1 applicator vaginally at bedtime.   No facility-administered encounter medications on file as of 06/24/2018.     Allergy:  Allergies  Allergen Reactions  . Crestor [Rosuvastatin] Other (See Comments)    myalgia  . Lipitor [Atorvastatin] Other (See Comments)    myalgia  . Acetaminophen Itching and Other (See Comments)    "hyperactivity"  . Vytorin [Ezetimibe-Simvastatin] Other (See Comments)    myalgia    Social Hx:   Social History   Socioeconomic History  . Marital status: Married    Spouse name: Not on file  . Number of children: Not on file  . Years of education: Not on file  . Highest education level: Not on file  Occupational History  . Not on file  Social Needs  . Financial resource strain: Not on file  . Food insecurity    Worry: Not on file    Inability: Not on file  . Transportation needs    Medical: Not on file    Non-medical: Not on file  Tobacco Use  . Smoking status: Never  Smoker  . Smokeless tobacco: Never Used  Substance and Sexual Activity  . Alcohol use: Yes    Comment: one to two beers a year  . Drug use: No  . Sexual activity: Not on file  Lifestyle  . Physical activity    Days per week: Not on file    Minutes per session: Not on file  . Stress: Not on file  Relationships  . Social Herbalist on phone: Not on file    Gets together: Not on file    Attends religious service: Not on file    Active member of club or organization: Not on file    Attends meetings of clubs or organizations: Not on file    Relationship status: Not on file  . Intimate partner violence    Fear of current or ex partner: Not on file    Emotionally abused: Not on file    Physically abused: Not on file    Forced sexual activity: Not on file  Other Topics Concern  . Not on file  Social History Narrative  . Not on file    Past Surgical Hx:  Past Surgical History:  Procedure Laterality Date  . APPENDECTOMY    . BUNIONECTOMY    .  CATARACT EXTRACTION    . CESAREAN SECTION    . CHOLECYSTECTOMY    . FOOT SURGERY Right 2011   bone graft   . HYSTEROSCOPY W/D&C N/A 06/11/2018   Procedure: DILATATION AND CURETTAGE /HYSTEROSCOPY WITH MYOSURE POLYPECTOMY;  Surgeon: Lavonia Drafts, MD;  Location: [facility];  Service: Gynecology;  Laterality: N/A;  . NASAL SEPTUM SURGERY     at age 40  . TONSILLECTOMY      Past Medical Hx:  Past Medical History:  Diagnosis Date  . Diverticulosis   . Hearing loss    wears hearing aids  . History of anemia as a child   . Hypercholesteremia    diet controlled  . Hyperlipidemia 02/16/2016  . Hypothyroid   . Hypothyroidism 02/16/2016    Past Gynecological History:  C/s x 1, menopause age 87. No LMP recorded (lmp unknown). Patient is postmenopausal.  Family Hx:  Family History  Problem Relation Age of Onset  . Hypertension Mother   . Heart attack Mother   . Heart attack Father   . Lung cancer Father   . Diabetes  Sister   . Cervical cancer Sister   . Skin cancer Sister   . Diabetes Brother   . CAD Brother   . Lymphoma Brother   . Skin cancer Brother   . Diabetes Brother   . Skin cancer Brother   . Skin cancer Brother   . Prostate cancer Brother   . Diverticulitis Brother   . Skin cancer Brother   . Skin cancer Sister     Review of Systems:  Constitutional  Feels well,    ENT Normal appearing ears and nares bilaterally Skin/Breast  No rash, sores, jaundice, itching, dryness Cardiovascular  No chest pain, shortness of breath, or edema  Pulmonary  No cough or wheeze.  Gastro Intestinal  No nausea, vomitting, or diarrhoea. No bright red blood per rectum, no abdominal pain, change in bowel movement, or constipation.  Genito Urinary  No frequency, urgency, dysuria, + postmenopausal bleeding  Musculo Skeletal  No myalgia, arthralgia, joint swelling or pain  Neurologic  No weakness, numbness, change in gait,  Psychology  No depression, anxiety, insomnia.   Vitals:  Blood pressure (!) 131/56, pulse 71, temperature 98.3 F (36.8 C), temperature source Oral, resp. rate 18, height 5' 2.5" (1.588 m), weight 211 lb 3.2 oz (95.8 kg), SpO2 99 %.  Physical Exam: WD in NAD Neck  Supple NROM, without any enlargements.  Lymph Node Survey No cervical supraclavicular or inguinal adenopathy Cardiovascular  Pulse normal rate, regularity and rhythm. S1 and S2 normal.  Lungs  Clear to auscultation bilateraly, without wheezes/crackles/rhonchi. Good air movement.  Skin  No rash/lesions/breakdown  Psychiatry  Alert and oriented to person, place, and time  Abdomen  Normoactive bowel sounds, abdomen soft, non-tender and obese without evidence of hernia.  Back No CVA tenderness Genito Urinary  Vulva/vagina: Normal external female genitalia.  No lesions. No discharge or bleeding.  Bladder/urethra:  No lesions or masses, well supported bladder  Vagina: normal, narrow  Cervix: Normal appearing,  no lesions.  Uterus:  Small, mobile, no parametrial involvement or nodularity.  Adnexa: no discretely palpable masses. Rectal  deferred Extremities  No bilateral cyanosis, clubbing or edema.   Thereasa Solo, MD  06/24/2018, 1:30 PM

## 2018-06-24 NOTE — Progress Notes (Signed)
Consult Note: Gyn-Onc  Consult was requested by Dr. Ihor Dow for the evaluation of Elizabeth Maldonado 73 y.o. female  CC:  Chief Complaint  Patient presents with  . Endometrial thickening on ultrasound  . Complex endometrial hyperplasia with atypia    Assessment/Plan:  [name]  is a 73 y.o.  year old with grade 1 endometrioid endometrial cancer.  A detailed discussion was held with the patient and her family with regard to to her endometrial cancer diagnosis. We discussed the standard management options for uterine cancer which includes surgery followed possibly by adjuvant therapy depending on the results of surgery. The options for surgical management include a hysterectomy and removal of the tubes and ovaries possibly with removal of pelvic and para-aortic lymph nodes.If feasible, a minimally invasive approach including a robotic hysterectomy or laparoscopic hysterectomy have benefits including shorter hospital stay, recovery time and better wound healing than with open surgery. The patient has been counseled about these surgical options and the risks of surgery in general including infection, bleeding, damage to surrounding structures (including bowel, bladder, ureters, nerves or vessels), and the postoperative risks of PE/ DVT, and lymphedema. I extensively reviewed the additional risks of robotic hysterectomy including possible need for conversion to open laparotomy.  I discussed positioning during surgery of trendelenberg and risks of minor facial swelling and care we take in preoperative positioning.  I discussed that her obesity places her at greater risk for these complications. I discussed that due to the narrowness of her postmenopausal vagina, she may require a minilaparotomy for specimen delivery.   After counseling and consideration of her options, she desires to proceed with robotic assisted total hysterectomy with bilateral sapingo-oophorectomy and SLN biopsy.   She will  be seen by anesthesia for preoperative clearance and discussion of postoperative pain management.  She was given the opportunity to ask questions, which were answered to her satisfaction, and she is agreement with the above mentioned plan of care.  HPI: [name] is a 73 year old P1 who is seen in consultation at the request of Dr Ihor Dow for a grade 1 endometrial adenocarcinoma.   Patient reports postmenopausal bleeding on Mother's Day of 2020.  This prompted her to discuss it with her primary care physician, Dr. Tamala Julian, followed by a consultation with Dr. Ihor Dow.  Dr. Ihor Dow performed a transvaginal ultrasound scan on June 05, 2018.  This revealed a uterus measuring 6.5 x 4 x 4.6 cm.  The endometrium measured 24 mm and contained a markedly heterogeneous endometrial complex with multiple cystic areas.  The left and right ovaries were not visualized.  An office sampling was attempted but was unsuccessful due to discomfort.  The patient was taken to the operating room on June 11, 2018 where a D&C was performed.  This was accompanied by a hysteroscopy and MyoSure polypectomy.  Intraoperative findings were significant for a large endometrial polyp.  Final pathology from this procedure revealed extensive complex atypical hyperplasia with foci suspicious for a well differentiated endometrioid adenocarcinoma.  The patient is obese with a BMI of 37.4 kg/m.  She is carries a diagnosis of hypertension, hypercholesterolemia, and hypothyroidism.  She has had 1 prior pregnancy with a cesarean delivery.  She has had a laparoscopic cholecystectomy and as a child an open appendectomy.  Menopause transitioned at age 29.  She reports that she was on hormone replacement therapy for about 5 years, she is unclear if this was unopposed estrogen hormone replacement therapy.  The patient's family history significant  for brother with prostate cancer, a brother with lymphoma, and a father who had lung  cancer but was a smoker.  The patient is a retired Associate Professor for high school.  She lives with her husband who is not of good underlying health.  Current Meds:  Outpatient Encounter Medications as of 06/24/2018  Medication Sig  . b complex vitamins tablet Take 1 tablet by mouth daily.  . Calcium Citrate-Vitamin D (CALCIUM + D PO) Take 1 tablet by mouth daily.  . Cholecalciferol (VITAMIN D) 125 MCG (5000 UT) CAPS Take 5,000 Units by mouth daily.  Marland Kitchen ibuprofen (ADVIL) 600 MG tablet Take 1 tablet (600 mg total) by mouth every 6 (six) hours as needed for moderate pain or cramping.  Marland Kitchen levothyroxine (SYNTHROID, LEVOTHROID) 75 MCG tablet Take 75 mcg by mouth daily.  . Multiple Vitamin (MULTIVITAMIN WITH MINERALS) TABS tablet Take 1 tablet by mouth daily.  . Tetrahydrozoline HCl (VISINE OP) Place 1 drop into both eyes daily as needed (irritation).  . vitamin C (ASCORBIC ACID) 500 MG tablet Take 500 mg by mouth daily.  . [DISCONTINUED] terconazole (TERAZOL 7) 0.4 % vaginal cream Place 1 applicator vaginally at bedtime.   No facility-administered encounter medications on file as of 06/24/2018.     Allergy:  Allergies  Allergen Reactions  . Crestor [Rosuvastatin] Other (See Comments)    myalgia  . Lipitor [Atorvastatin] Other (See Comments)    myalgia  . Acetaminophen Itching and Other (See Comments)    "hyperactivity"  . Vytorin [Ezetimibe-Simvastatin] Other (See Comments)    myalgia    Social Hx:   Social History   Socioeconomic History  . Marital status: Married    Spouse name: Not on file  . Number of children: Not on file  . Years of education: Not on file  . Highest education level: Not on file  Occupational History  . Not on file  Social Needs  . Financial resource strain: Not on file  . Food insecurity    Worry: Not on file    Inability: Not on file  . Transportation needs    Medical: Not on file    Non-medical: Not on file  Tobacco Use  . Smoking status: Never  Smoker  . Smokeless tobacco: Never Used  Substance and Sexual Activity  . Alcohol use: Yes    Comment: one to two beers a year  . Drug use: No  . Sexual activity: Not on file  Lifestyle  . Physical activity    Days per week: Not on file    Minutes per session: Not on file  . Stress: Not on file  Relationships  . Social Herbalist on phone: Not on file    Gets together: Not on file    Attends religious service: Not on file    Active member of club or organization: Not on file    Attends meetings of clubs or organizations: Not on file    Relationship status: Not on file  . Intimate partner violence    Fear of current or ex partner: Not on file    Emotionally abused: Not on file    Physically abused: Not on file    Forced sexual activity: Not on file  Other Topics Concern  . Not on file  Social History Narrative  . Not on file    Past Surgical Hx:  Past Surgical History:  Procedure Laterality Date  . APPENDECTOMY    . BUNIONECTOMY    .  CATARACT EXTRACTION    . CESAREAN SECTION    . CHOLECYSTECTOMY    . FOOT SURGERY Right 2011   bone graft   . HYSTEROSCOPY W/D&C N/A 06/11/2018   Procedure: DILATATION AND CURETTAGE /HYSTEROSCOPY WITH MYOSURE POLYPECTOMY;  Surgeon: Lavonia Drafts, MD;  Location: [facility];  Service: Gynecology;  Laterality: N/A;  . NASAL SEPTUM SURGERY     at age 60  . TONSILLECTOMY      Past Medical Hx:  Past Medical History:  Diagnosis Date  . Diverticulosis   . Hearing loss    wears hearing aids  . History of anemia as a child   . Hypercholesteremia    diet controlled  . Hyperlipidemia 02/16/2016  . Hypothyroid   . Hypothyroidism 02/16/2016    Past Gynecological History:  C/s x 1, menopause age 61. No LMP recorded (lmp unknown). Patient is postmenopausal.  Family Hx:  Family History  Problem Relation Age of Onset  . Hypertension Mother   . Heart attack Mother   . Heart attack Father   . Lung cancer Father   . Diabetes  Sister   . Cervical cancer Sister   . Skin cancer Sister   . Diabetes Brother   . CAD Brother   . Lymphoma Brother   . Skin cancer Brother   . Diabetes Brother   . Skin cancer Brother   . Skin cancer Brother   . Prostate cancer Brother   . Diverticulitis Brother   . Skin cancer Brother   . Skin cancer Sister     Review of Systems:  Constitutional  Feels well,    ENT Normal appearing ears and nares bilaterally Skin/Breast  No rash, sores, jaundice, itching, dryness Cardiovascular  No chest pain, shortness of breath, or edema  Pulmonary  No cough or wheeze.  Gastro Intestinal  No nausea, vomitting, or diarrhoea. No bright red blood per rectum, no abdominal pain, change in bowel movement, or constipation.  Genito Urinary  No frequency, urgency, dysuria, + postmenopausal bleeding  Musculo Skeletal  No myalgia, arthralgia, joint swelling or pain  Neurologic  No weakness, numbness, change in gait,  Psychology  No depression, anxiety, insomnia.   Vitals:  Blood pressure (!) 131/56, pulse 71, temperature 98.3 F (36.8 C), temperature source Oral, resp. rate 18, height 5' 2.5" (1.588 m), weight 211 lb 3.2 oz (95.8 kg), SpO2 99 %.  Physical Exam: WD in NAD Neck  Supple NROM, without any enlargements.  Lymph Node Survey No cervical supraclavicular or inguinal adenopathy Cardiovascular  Pulse normal rate, regularity and rhythm. S1 and S2 normal.  Lungs  Clear to auscultation bilateraly, without wheezes/crackles/rhonchi. Good air movement.  Skin  No rash/lesions/breakdown  Psychiatry  Alert and oriented to person, place, and time  Abdomen  Normoactive bowel sounds, abdomen soft, non-tender and obese without evidence of hernia.  Back No CVA tenderness Genito Urinary  Vulva/vagina: Normal external female genitalia.  No lesions. No discharge or bleeding.  Bladder/urethra:  No lesions or masses, well supported bladder  Vagina: normal, narrow  Cervix: Normal appearing,  no lesions.  Uterus:  Small, mobile, no parametrial involvement or nodularity.  Adnexa: no discretely palpable masses. Rectal  deferred Extremities  No bilateral cyanosis, clubbing or edema.   Thereasa Solo, MD  06/24/2018, 1:30 PM

## 2018-07-03 ENCOUNTER — Other Ambulatory Visit: Payer: Self-pay | Admitting: Gynecologic Oncology

## 2018-07-03 ENCOUNTER — Telehealth: Payer: Self-pay | Admitting: Gynecologic Oncology

## 2018-07-03 DIAGNOSIS — C541 Malignant neoplasm of endometrium: Secondary | ICD-10-CM

## 2018-07-03 MED ORDER — OXYCODONE HCL 5 MG PO TABS: 5.0000 mg | ORAL_TABLET | ORAL | 0 refills | Status: DC | PRN

## 2018-07-03 MED ORDER — IBUPROFEN 600 MG PO TABS: 600.0000 mg | ORAL_TABLET | Freq: Four times a day (QID) | ORAL | 0 refills | Status: DC | PRN

## 2018-07-03 MED ORDER — SENNOSIDES-DOCUSATE SODIUM 8.6-50 MG PO TABS: 2.0000 | ORAL_TABLET | Freq: Every day | ORAL | 1 refills | Status: DC

## 2018-07-03 NOTE — Telephone Encounter (Signed)
Called patient to follow up from recent appt and see if she had any questions or concerns about her upcoming surgery. Discussed pre-op instructions and all questions answered.  Medications for post-op prescribed pre-op. Advised patient to call for any needs.  She has her appt at [facility] pre-op tomorrow. She states she used to put fish oil and tumeric in her smoothies but has stopped for over a week now.

## 2018-07-03 NOTE — Patient Instructions (Addendum)
YOU NEED TO HAVE A COVID 19 TEST ON  ____Monday , July 6th ________, THIS TEST MUST BE DONE BEFORE SURGERY, COME TO [place] ENTRANCE. ONCE YOUR COVID TEST IS COMPLETED, PLEASE BEGIN THE QUARANTINE INSTRUCTIONS AS OUTLINED IN YOUR HANDOUT.                  [facility]    Your procedure is scheduled on: 07-11-2018  Report to [facility] Main  Entrance    Report to [place] at 530  AM      Call this number if you have problems the morning of surgery [phone]    Remember:  [place], NO CHEWING GUM [place].   Eat a light diet the day before surgery.  Examples including soups, broths, toast, yogurt, mashed potatoes.  Things to avoid include carbonated beverages (fizzy beverages), raw fruits and raw vegetables, or beans. If your bowels are filled with gas, your surgeon will have difficulty visualizing your pelvic organs which increases your surgical risks.   NO SOLIDS AFTER MIDNIGHT. DRINK ONLY CLEAR LIQUIDS FROM MIDNIGHT UNTIL 430 AM DAY OF SURGERY , NOTHING BY MOUTH AFTER 430 AM .    CLEAR LIQUID DIET   Foods Allowed                                                                     Foods Excluded  Coffee and tea, regular and decaf                             liquids that you cannot  Plain Jell-O in any flavor                                             see through such as: Fruit ices (not with fruit pulp)                                     milk, soups, orange juice  Iced Popsicles                                    All solid food                                Cranberry, grape and apple juices Sports drinks like Gatorade Lightly seasoned clear broth or consume(fat free) Sugar, honey syrup  Sample Menu Breakfast                                Lunch                                     Supper Cranberry juice  Beef broth                            Chicken  broth Jell-O                                     Grape juice                           Apple juice Coffee or tea                        Jell-O                                      Popsicle                                                Coffee or tea                        Coffee or tea  _____________________________________________________________________     Take these medicines the morning of surgery with A SIP OF WATER: LEVOTHYROXINE, EYE DROPS                                   You may not have any metal on your body including hair pins and              piercings  Do not wear jewelry, make-up, lotions, powders or perfumes, deodorant             Do not wear nail polish.  Do not shave  48 hours prior to surgery.              Men may shave face and neck.   Do not bring valuables to the hospital. [place].  Contacts, dentures or bridgework may not be worn into surgery.      Patients discharged the day of surgery will not be allowed to drive home. IF YOU ARE HAVING SURGERY AND GOING HOME THE SAME DAY, YOU MUST HAVE AN ADULT TO DRIVE YOU HOME AND BE WITH YOU FOR 24 HOURS. YOU MAY GO HOME BY TAXI OR UBER OR ORTHERWISE, BUT AN ADULT MUST ACCOMPANY YOU HOME AND STAY WITH YOU FOR 24 HOURS.  Name and phone number of your driver:  Special Instructions: N/A              Please read over the following fact sheets you were given: _____________________________________________________________________             [facility] - Preparing for Surgery Before surgery, you can play an important role.  Because skin is not sterile, your skin needs to be as free of germs as possible.  You can reduce the number of germs on your skin by washing with CHG (chlorahexidine gluconate) soap before surgery.  CHG is an antiseptic cleaner which kills germs and bonds with  the skin to continue killing germs even after washing. Please DO NOT use if you have an  allergy to CHG or antibacterial soaps.  If your skin becomes reddened/irritated stop using the CHG and inform your nurse when you arrive at Short Stay. Do not shave (including legs and underarms) for at least 48 hours prior to the first CHG shower.  You may shave your face/neck. Please follow these instructions carefully:  1.  Shower with CHG Soap the night before surgery and the  morning of Surgery.  2.  If you choose to wash your hair, wash your hair first as usual with your  normal  shampoo.  3.  After you shampoo, rinse your hair and body thoroughly to remove the  shampoo.                           4.  Use CHG as you would any other liquid soap.  You can apply chg directly  to the skin and wash                       Gently with a scrungie or clean washcloth.  5.  Apply the CHG Soap to your body ONLY FROM THE NECK DOWN.   Do not use on face/ open                           Wound or open sores. Avoid contact with eyes, ears mouth and genitals (private parts).                       Wash face,  Genitals (private parts) with your normal soap.             6.  Wash thoroughly, paying special attention to the area where your surgery  will be performed.  7.  Thoroughly rinse your body with warm water from the neck down.  8.  DO NOT shower/wash with your normal soap after using and rinsing off  the CHG Soap.                9.  Pat yourself dry with a clean towel.            10.  Wear clean pajamas.            11.  Place clean sheets on your bed the night of your first shower and do not  sleep with pets. Day of Surgery : Do not apply any lotions/deodorants the morning of surgery.  Please wear clean clothes to the hospital/surgery center.  FAILURE TO FOLLOW THESE INSTRUCTIONS MAY RESULT IN THE CANCELLATION OF YOUR SURGERY PATIENT SIGNATURE_________________________________  NURSE  SIGNATURE__________________________________  ________________________________________________________________________   Adam Phenix  An incentive spirometer is a tool that can help keep your lungs clear and active. This tool measures how well you are filling your lungs with each breath. Taking long deep breaths may help reverse or decrease the chance of developing breathing (pulmonary) problems (especially infection) following:  A long period of time when you are unable to move or be active. BEFORE THE PROCEDURE   If the spirometer includes an indicator to show your best effort, your nurse or respiratory therapist will set it to a desired goal.  If possible, sit up straight or lean slightly forward. Try not to slouch.  Hold the incentive spirometer in an upright position. INSTRUCTIONS FOR  USE  1. Sit on the edge of your bed if possible, or sit up as far as you can in bed or on a chair. 2. Hold the incentive spirometer in an upright position. 3. Breathe out normally. 4. Place the mouthpiece in your mouth and seal your lips tightly around it. 5. Breathe in slowly and as deeply as possible, raising the piston or the ball toward the top of the column. 6. Hold your breath for 3-5 seconds or for as long as possible. Allow the piston or ball to fall to the bottom of the column. 7. Remove the mouthpiece from your mouth and breathe out normally. 8. Rest for a few seconds and repeat Steps 1 through 7 at least 10 times every 1-2 hours when you are awake. Take your time and take a few normal breaths between deep breaths. 9. The spirometer may include an indicator to show your best effort. Use the indicator as a goal to work toward during each repetition. 10. After each set of 10 deep breaths, practice coughing to be sure your lungs are clear. If you have an incision (the cut made at the time of surgery), support your incision when coughing by placing a pillow or rolled up towels firmly  against it. Once you are able to get out of bed, walk around indoors and cough well. You may stop using the incentive spirometer when instructed by your caregiver.  RISKS AND COMPLICATIONS  Take your time so you do not get dizzy or light-headed.  If you are in pain, you may need to take or ask for pain medication before doing incentive spirometry. It is harder to take a deep breath if you are having pain. AFTER USE  Rest and breathe slowly and easily.  It can be helpful to keep track of a log of your progress. Your caregiver can provide you with a simple table to help with this. If you are using the spirometer at home, follow these instructions: [place] IF:   You are having difficultly using the spirometer.  You have trouble using the spirometer as often as instructed.  Your pain medication is not giving enough relief while using the spirometer.  You develop fever of 100.5 F (38.1 C) or higher. SEEK IMMEDIATE MEDICAL CARE IF:   You cough up bloody sputum that had not been present before.  You develop fever of 102 F (38.9 C) or greater.  You develop worsening pain at or near the incision site. MAKE SURE YOU:   Understand these instructions.  Will watch your condition.  Will get help right away if you are not doing well or get worse. Document Released: 05/01/2006 Document Revised: 03/13/2011 Document Reviewed: 07/02/2006 ExitCare Patient Information 2014 [employer], [place].   ________________________________________________________________________  WHAT IS A BLOOD TRANSFUSION? Blood Transfusion Information  A transfusion is the replacement of blood or some of its parts. Blood is made up of multiple cells which provide different functions.  Red blood cells carry oxygen and are used for blood loss replacement.  White blood cells fight against infection.  Platelets control bleeding.  Plasma helps clot blood.  Other blood products are available for  specialized needs, such as hemophilia or other clotting disorders. BEFORE THE TRANSFUSION  Who gives blood for transfusions?   Healthy volunteers who are fully evaluated to make sure their blood is safe. This is blood bank blood. Transfusion therapy is the safest it has ever been in the practice of medicine. Before blood is taken  from a donor, a complete history is taken to make sure that person has no history of diseases nor engages in risky social behavior (examples are intravenous drug use or sexual activity with multiple partners). The donor's travel history is screened to minimize risk of transmitting infections, such as malaria. The donated blood is tested for signs of infectious diseases, such as HIV and hepatitis. The blood is then tested to be sure it is compatible with you in order to minimize the chance of a transfusion reaction. If you or a relative donates blood, this is often done in anticipation of surgery and is not appropriate for emergency situations. It takes many days to process the donated blood. RISKS AND COMPLICATIONS Although transfusion therapy is very safe and saves many lives, the main dangers of transfusion include:   Getting an infectious disease.  Developing a transfusion reaction. This is an allergic reaction to something in the blood you were given. Every precaution is taken to prevent this. The decision to have a blood transfusion has been considered carefully by your caregiver before blood is given. Blood is not given unless the benefits outweigh the risks. AFTER THE TRANSFUSION  Right after receiving a blood transfusion, you will usually feel much better and more energetic. This is especially true if your red blood cells have gotten low (anemic). The transfusion raises the level of the red blood cells which carry oxygen, and this usually causes an energy increase.  The nurse administering the transfusion will monitor you carefully for complications. HOME CARE  INSTRUCTIONS  No special instructions are needed after a transfusion. You may find your energy is better. Speak with your caregiver about any limitations on activity for underlying diseases you may have. SEEK MEDICAL CARE IF:   Your condition is not improving after your transfusion.  You develop redness or irritation at the intravenous (IV) site. SEEK IMMEDIATE MEDICAL CARE IF:  Any of the following symptoms occur over the next 12 hours:  Shaking chills.  You have a temperature by mouth above 102 F (38.9 C), not controlled by medicine.  Chest, back, or muscle pain.  People around you feel you are not acting correctly or are confused.  Shortness of breath or difficulty breathing.  Dizziness and fainting.  You get a rash or develop hives.  You have a decrease in urine output.  Your urine turns a dark color or changes to pink, red, or brown. Any of the following symptoms occur over the next 10 days:  You have a temperature by mouth above 102 F (38.9 C), not controlled by medicine.  Shortness of breath.  Weakness after normal activity.  The white part of the eye turns yellow (jaundice).  You have a decrease in the amount of urine or are urinating less often.  Your urine turns a dark color or changes to pink, red, or brown. Document Released: 12/17/1999 Document Revised: 03/13/2011 Document Reviewed: 08/05/2007 [facility] Patient Information 2014 [place], [place].  _______________________________________________________________________

## 2018-07-04 ENCOUNTER — Other Ambulatory Visit: Payer: Self-pay

## 2018-07-04 ENCOUNTER — Encounter (HOSPITAL_COMMUNITY): Payer: Self-pay

## 2018-07-04 ENCOUNTER — Encounter (HOSPITAL_COMMUNITY)
Admission: RE | Admit: 2018-07-04 | Discharge: 2018-07-04 | Disposition: A | Discharge status: Home / Self Care | Payer: Medicare Other | Source: Ambulatory Visit | Attending: Gynecologic Oncology | Admitting: Gynecologic Oncology

## 2018-07-04 DIAGNOSIS — Z1159 Encounter for screening for other viral diseases: Secondary | ICD-10-CM | POA: Insufficient documentation

## 2018-07-04 DIAGNOSIS — C541 Malignant neoplasm of endometrium: Secondary | ICD-10-CM | POA: Insufficient documentation

## 2018-07-04 DIAGNOSIS — Z01812 Encounter for preprocedural laboratory examination: Secondary | ICD-10-CM | POA: Diagnosis present

## 2018-07-04 HISTORY — DX: Elevated blood-pressure reading, without diagnosis of hypertension: R03.0

## 2018-07-04 LAB — URINALYSIS, ROUTINE W REFLEX MICROSCOPIC
Bilirubin Urine: NEGATIVE
Glucose, UA: NEGATIVE mg/dL
Hgb urine dipstick: NEGATIVE
Ketones, ur: NEGATIVE mg/dL
Leukocytes,Ua: NEGATIVE
Nitrite: NEGATIVE
Protein, ur: NEGATIVE mg/dL
Specific Gravity, Urine: 1.015 (ref 1.005–1.030)
pH: 6 (ref 5.0–8.0)

## 2018-07-04 LAB — COMPREHENSIVE METABOLIC PANEL
ALT: 16 U/L (ref 0–44)
AST: 15 U/L (ref 15–41)
Albumin: 3.8 g/dL (ref 3.5–5.0)
Alkaline Phosphatase: 64 U/L (ref 38–126)
Anion gap: 6 (ref 5–15)
BUN: 21 mg/dL (ref 8–23)
CO2: 28 mmol/L (ref 22–32)
Calcium: 8.9 mg/dL (ref 8.9–10.3)
Chloride: 105 mmol/L (ref 98–111)
Creatinine, Ser: 0.92 mg/dL (ref 0.44–1.00)
GFR calc Af Amer: >60 mL/min (ref >60)
GFR calc non Af Amer: >60 mL/min (ref >60)
Glucose, Bld: 95 mg/dL (ref 70–99)
Potassium: 4.3 mmol/L (ref 3.5–5.1)
Sodium: 139 mmol/L (ref 135–145)
Total Bilirubin: 0.6 mg/dL (ref 0.3–1.2)
Total Protein: 6.8 g/dL (ref 6.5–8.1)

## 2018-07-04 LAB — CBC
HCT: 38.7 % (ref 36.0–46.0)
Hemoglobin: 12.5 g/dL (ref 12.0–15.0)
MCH: 31.1 pg (ref 26.0–34.0)
MCHC: 32.3 g/dL (ref 30.0–36.0)
MCV: 96.3 fL (ref 80.0–100.0)
Platelets: 198 10*3/uL (ref 150–400)
RBC: 4.02 MIL/uL (ref 3.87–5.11)
RDW: 13.6 % (ref 11.5–15.5)
WBC: 4.7 10*3/uL (ref 4.0–10.5)
nRBC: 0 % (ref 0.0–0.2)

## 2018-07-05 LAB — ABO/RH: ABO/RH(D): O NEG

## 2018-07-08 ENCOUNTER — Ambulatory Visit (HOSPITAL_COMMUNITY): Payer: Medicare Other | Admitting: Physician Assistant

## 2018-07-08 ENCOUNTER — Ambulatory Visit (HOSPITAL_COMMUNITY): Payer: Medicare Other | Admitting: Anesthesiology

## 2018-07-08 ENCOUNTER — Other Ambulatory Visit (HOSPITAL_COMMUNITY)
Admission: RE | Admit: 2018-07-08 | Discharge: 2018-07-08 | Disposition: A | Discharge status: Home / Self Care | Payer: Medicare Other | Source: Ambulatory Visit | Attending: Gynecologic Oncology | Admitting: Gynecologic Oncology

## 2018-07-08 DIAGNOSIS — Z1159 Encounter for screening for other viral diseases: Secondary | ICD-10-CM | POA: Insufficient documentation

## 2018-07-08 DIAGNOSIS — Z01812 Encounter for preprocedural laboratory examination: Secondary | ICD-10-CM | POA: Insufficient documentation

## 2018-07-09 ENCOUNTER — Telehealth: Payer: Self-pay

## 2018-07-09 LAB — SARS CORONAVIRUS 2 (TAT 6-24 HRS): SARS Coronavirus 2: NEGATIVE

## 2018-07-09 NOTE — Telephone Encounter (Signed)
Told Ms [facility] that Dr. Denman George will proceed with surgery. Use tylenol only as noted previously. Pt verbalized understanding.

## 2018-07-09 NOTE — Telephone Encounter (Signed)
Had spoken with patient about a refill request for her Ibuprofen that was ordered for post op. Pt did not request the refill. She has not taken any of the tablets. Asked if she has taken any NSAID.  She stated that she began Saturday 07-06-18 with a tooth that occurs occasionally as her teeth are crowded around a tooth she had a root canal.   She is using ibuprofen 200 mg tabs 2 tid. Since sat.  She has only taken 600 mg this am so far.  Told her to stop and use tylenol only. Told her that this medication can make her more prone to bleed with surgery. Told her that this would need to be reviewed to see if Dr. Denman George will proceed with her surgery on 07-11-18.

## 2018-07-10 NOTE — Anesthesia Preprocedure Evaluation (Deleted)
Anesthesia Evaluation  Patient identified by MRN, date of birth, ID band Patient awake    Reviewed: Allergy & Precautions, NPO status , Patient's Chart, lab work & pertinent test results  Airway Mallampati: III  TM Distance: >3 FB Neck ROM: Full    Dental no notable dental hx.    Pulmonary neg pulmonary ROS,    Pulmonary exam normal breath sounds clear to auscultation       Cardiovascular Exercise Tolerance: Good negative cardio ROS Normal cardiovascular exam Rhythm:Regular Rate:Normal  ECG: rate 71, NSR   Neuro/Psych negative neurological ROS  negative psych ROS   GI/Hepatic negative GI ROS, Neg liver ROS,   Endo/Other  Hypothyroidism   Renal/GU negative Renal ROS     Musculoskeletal negative musculoskeletal ROS (+)   Abdominal (+) + obese,   Peds  Hematology HLD   Anesthesia Other Findings LAPENDOMETRIAL CANCER  Reproductive/Obstetrics                            Anesthesia Physical Anesthesia Plan  ASA: II  Anesthesia Plan: General   Post-op Pain Management:    Induction: Intravenous  PONV Risk Score and Plan: 3 and Ondansetron, Dexamethasone, Midazolam and Treatment may vary due to age or medical condition  Airway Management Planned: Oral ETT  Additional Equipment:   Intra-op Plan:   Post-operative Plan: Extubation in OR  Informed Consent: I have reviewed the patients History and Physical, chart, labs and discussed the procedure including the risks, benefits and alternatives for the proposed anesthesia with the patient or authorized representative who has indicated his/her understanding and acceptance.     Dental advisory given  Plan Discussed with: CRNA  Anesthesia Plan Comments:        Anesthesia Quick Evaluation

## 2018-07-11 ENCOUNTER — Ambulatory Visit (HOSPITAL_COMMUNITY)
Admission: RE | Admit: 2018-07-11 | Discharge: 2018-07-11 | Disposition: A | Discharge status: Home / Self Care | Payer: Medicare Other | Attending: Gynecologic Oncology | Admitting: Gynecologic Oncology

## 2018-07-11 ENCOUNTER — Encounter (HOSPITAL_COMMUNITY): Payer: Self-pay | Admitting: Emergency Medicine

## 2018-07-11 ENCOUNTER — Encounter (HOSPITAL_COMMUNITY)
Admission: RE | Disposition: A | Discharge status: Home / Self Care | Payer: Self-pay | Source: Home / Self Care | Attending: Gynecologic Oncology

## 2018-07-11 ENCOUNTER — Other Ambulatory Visit: Payer: Self-pay

## 2018-07-11 ENCOUNTER — Other Ambulatory Visit: Payer: Self-pay | Admitting: Gynecologic Oncology

## 2018-07-11 DIAGNOSIS — E669 Obesity, unspecified: Secondary | ICD-10-CM

## 2018-07-11 DIAGNOSIS — C541 Malignant neoplasm of endometrium: Secondary | ICD-10-CM

## 2018-07-11 DIAGNOSIS — Z538 Procedure and treatment not carried out for other reasons: Secondary | ICD-10-CM | POA: Diagnosis not present

## 2018-07-11 LAB — TYPE AND SCREEN
ABO/RH(D): O NEG
Antibody Screen: NEGATIVE

## 2018-07-11 SURGERY — HYSTERECTOMY, TOTAL, ROBOT-ASSISTED, LAPAROSCOPIC, WITH BILATERAL SALPINGO-OOPHORECTOMY
Anesthesia: General

## 2018-07-11 MED ORDER — SCOPOLAMINE 1 MG/3DAYS TD PT72
MEDICATED_PATCH | TRANSDERMAL | Status: AC
  Administered 2018-07-11: 09:00:00 1.5 mg via TRANSDERMAL
  Filled 2018-07-11: qty 1

## 2018-07-11 MED ORDER — PROPOFOL 10 MG/ML IV BOLUS
INTRAVENOUS | Status: AC
  Filled 2018-07-11: qty 20

## 2018-07-11 MED ORDER — BUPIVACAINE HCL (PF) 0.25 % IJ SOLN
INTRAMUSCULAR | Status: AC
  Filled 2018-07-11: qty 30

## 2018-07-11 MED ORDER — ENOXAPARIN SODIUM 40 MG/0.4ML ~~LOC~~ SOLN
40.0000 mg | SUBCUTANEOUS | Status: AC
  Administered 2018-07-11: 08:00:00 40 mg via SUBCUTANEOUS

## 2018-07-11 MED ORDER — CEFAZOLIN SODIUM-DEXTROSE 2-4 GM/100ML-% IV SOLN
INTRAVENOUS | Status: AC
  Filled 2018-07-11: qty 100

## 2018-07-11 MED ORDER — ENOXAPARIN SODIUM 40 MG/0.4ML ~~LOC~~ SOLN
SUBCUTANEOUS | Status: AC
  Administered 2018-07-11: 40 mg via SUBCUTANEOUS
  Filled 2018-07-11: qty 0.4

## 2018-07-11 MED ORDER — GABAPENTIN 300 MG PO CAPS
ORAL_CAPSULE | ORAL | Status: AC
  Administered 2018-07-11: 08:00:00 300 mg via ORAL
  Filled 2018-07-11: qty 1

## 2018-07-11 MED ORDER — FENTANYL CITRATE (PF) 100 MCG/2ML IJ SOLN
INTRAMUSCULAR | Status: AC
  Filled 2018-07-11: qty 2

## 2018-07-11 MED ORDER — CEFAZOLIN SODIUM-DEXTROSE 2-4 GM/100ML-% IV SOLN: 2.0000 g | INTRAVENOUS | Status: DC

## 2018-07-11 MED ORDER — ONDANSETRON HCL 4 MG/2ML IJ SOLN
INTRAMUSCULAR | Status: AC
  Filled 2018-07-11: qty 2

## 2018-07-11 MED ORDER — STERILE WATER FOR INJECTION IJ SOLN
INTRAMUSCULAR | Status: AC
  Filled 2018-07-11: qty 10

## 2018-07-11 MED ORDER — ROCURONIUM BROMIDE 10 MG/ML (PF) SYRINGE
PREFILLED_SYRINGE | INTRAVENOUS | Status: AC
  Filled 2018-07-11: qty 10

## 2018-07-11 MED ORDER — SCOPOLAMINE 1 MG/3DAYS TD PT72
1.0000 | MEDICATED_PATCH | TRANSDERMAL | Status: DC
  Administered 2018-07-11: 1.5 mg via TRANSDERMAL

## 2018-07-11 MED ORDER — LACTATED RINGERS IV SOLN
INTRAVENOUS | Status: DC
  Administered 2018-07-11: 08:00:00 via INTRAVENOUS

## 2018-07-11 MED ORDER — LIDOCAINE 2% (20 MG/ML) 5 ML SYRINGE
INTRAMUSCULAR | Status: AC
  Filled 2018-07-11: qty 5

## 2018-07-11 MED ORDER — DEXAMETHASONE SODIUM PHOSPHATE 4 MG/ML IJ SOLN: 4.0000 mg | INTRAMUSCULAR | Status: DC

## 2018-07-11 MED ORDER — DEXAMETHASONE SODIUM PHOSPHATE 10 MG/ML IJ SOLN
INTRAMUSCULAR | Status: AC
  Filled 2018-07-11: qty 1

## 2018-07-11 MED ORDER — GABAPENTIN 300 MG PO CAPS
300.0000 mg | ORAL_CAPSULE | ORAL | Status: AC
  Administered 2018-07-11: 08:00:00 300 mg via ORAL

## 2018-07-11 NOTE — Progress Notes (Signed)
Pt ambulatory to main entrance, left with husband to go home.

## 2018-07-11 NOTE — Interval H&P Note (Signed)
History and Physical Interval Note:  07/11/2018 9:14 AM  Elizabeth Maldonado  has presented today for surgery, with the diagnosis of LAPENDOMETRIAL CANCER.  The various methods of treatment have been discussed with the patient and family. After consideration of risks, benefits and other options for treatment, the patient has consented to  Procedure(s): XI ROBOTIC ASSISTED TOTAL HYSTERECTOMY WITH BILATERAL SALPINGO OOPHORECTOMY (Bilateral) SENTINEL NODE BIOPSY (N/A) as a surgical intervention.  The patient's history has been reviewed, patient examined, no change in status, stable for surgery.  I have reviewed the patient's chart and labs.  Questions were answered to the patient's satisfaction.     Thereasa Solo

## 2018-07-11 NOTE — Progress Notes (Signed)
SPOKE WITH PATIENT BY PHONE AND MADE AWARE NEW SURGERY DATE 07-17-2018, CLEAR LIQUIDS FROM MIDNIGHT UNTIL 900AM , THEN NPO. ARRIVE 1000 AM 07-17-18 [facility] ADMITTING. COVID TEST SCHEDULED FOR 07-13-18 1030 AM.

## 2018-07-11 NOTE — Interval H&P Note (Signed)
History and Physical Interval Note:  07/11/2018 9:15 AM  Elizabeth Maldonado  has presented today for surgery, with the diagnosis of LAPENDOMETRIAL CANCER.  The various methods of treatment have been discussed with the patient and family. After consideration of risks, benefits and other options for treatment, the patient has consented to  Procedure(s): XI ROBOTIC ASSISTED TOTAL HYSTERECTOMY WITH BILATERAL SALPINGO OOPHORECTOMY (Bilateral) SENTINEL NODE BIOPSY (N/A) as a surgical intervention.  The patient's history has been reviewed, patient examined, no change in status, stable for surgery.  I have reviewed the patient's chart and labs.  Questions were answered to the patient's satisfaction.     Thereasa Solo

## 2018-07-11 NOTE — Progress Notes (Signed)
Surgery was cancelled due to surgeon COVID exposure.  Pt notified and is calling her ride to pick her up. Dr. Denman George informed patient of rescheduling her procedure. Pt verbalizes understanding.

## 2018-07-13 ENCOUNTER — Other Ambulatory Visit (HOSPITAL_COMMUNITY)
Admit: 2018-07-13 | Discharge: 2018-07-13 | Disposition: A | Discharge status: Home / Self Care | Payer: Medicare Other | Source: Ambulatory Visit | Attending: Gynecologic Oncology | Admitting: Gynecologic Oncology

## 2018-07-13 DIAGNOSIS — Z01812 Encounter for preprocedural laboratory examination: Secondary | ICD-10-CM | POA: Diagnosis present

## 2018-07-13 DIAGNOSIS — Z1159 Encounter for screening for other viral diseases: Secondary | ICD-10-CM | POA: Insufficient documentation

## 2018-07-14 LAB — SARS CORONAVIRUS 2 (TAT 6-24 HRS): SARS Coronavirus 2: NEGATIVE

## 2018-07-15 ENCOUNTER — Telehealth: Payer: Self-pay

## 2018-07-15 ENCOUNTER — Telehealth: Payer: Self-pay | Admitting: *Deleted

## 2018-07-15 NOTE — Telephone Encounter (Signed)
Told Ms [facility] that she can call Ivin Booty at pre op at [phone] and give a time that her husband will be at the main entrance to pick up another bottle. Purchase at [employer]. Use the wipes in the morning of surgery. Pt states that she will call Ivin Booty in pre op.

## 2018-07-15 NOTE — Telephone Encounter (Signed)
Faxed denial for refill of Ibuprofen as this prescription is to begin after surgery on 07-17-18.

## 2018-07-15 NOTE — Telephone Encounter (Signed)
Patient called and left a message stating "my surgery for last Thursday was canceled. I used all the surgery soap and need another bottle." message forwarded to [facility] APP

## 2018-07-16 ENCOUNTER — Inpatient Hospital Stay: Payer: Medicare Other | Admitting: Gynecologic Oncology

## 2018-07-17 ENCOUNTER — Ambulatory Visit (HOSPITAL_COMMUNITY): Payer: Medicare Other | Admitting: Anesthesiology

## 2018-07-17 ENCOUNTER — Encounter (HOSPITAL_COMMUNITY): Payer: Self-pay

## 2018-07-17 ENCOUNTER — Ambulatory Visit (HOSPITAL_COMMUNITY)
Admission: RE | Admit: 2018-07-17 | Discharge: 2018-07-17 | Disposition: A | Discharge status: Home / Self Care | Payer: Medicare Other | Attending: Gynecologic Oncology | Admitting: Gynecologic Oncology

## 2018-07-17 ENCOUNTER — Encounter (HOSPITAL_COMMUNITY)
Admission: RE | Disposition: A | Discharge status: Home / Self Care | Payer: Self-pay | Source: Home / Self Care | Attending: Gynecologic Oncology

## 2018-07-17 ENCOUNTER — Other Ambulatory Visit: Payer: Self-pay

## 2018-07-17 DIAGNOSIS — E7800 Pure hypercholesterolemia, unspecified: Secondary | ICD-10-CM | POA: Insufficient documentation

## 2018-07-17 DIAGNOSIS — N72 Inflammatory disease of cervix uteri: Secondary | ICD-10-CM | POA: Insufficient documentation

## 2018-07-17 DIAGNOSIS — D259 Leiomyoma of uterus, unspecified: Secondary | ICD-10-CM | POA: Diagnosis not present

## 2018-07-17 DIAGNOSIS — Z807 Family history of other malignant neoplasms of lymphoid, hematopoietic and related tissues: Secondary | ICD-10-CM | POA: Diagnosis not present

## 2018-07-17 DIAGNOSIS — Z801 Family history of malignant neoplasm of trachea, bronchus and lung: Secondary | ICD-10-CM | POA: Insufficient documentation

## 2018-07-17 DIAGNOSIS — Z8042 Family history of malignant neoplasm of prostate: Secondary | ICD-10-CM | POA: Insufficient documentation

## 2018-07-17 DIAGNOSIS — N8502 Endometrial intraepithelial neoplasia [EIN]: Secondary | ICD-10-CM | POA: Diagnosis not present

## 2018-07-17 DIAGNOSIS — Z6837 Body mass index (BMI) 37.0-37.9, adult: Secondary | ICD-10-CM | POA: Insufficient documentation

## 2018-07-17 DIAGNOSIS — E039 Hypothyroidism, unspecified: Secondary | ICD-10-CM | POA: Insufficient documentation

## 2018-07-17 DIAGNOSIS — Z79890 Hormone replacement therapy (postmenopausal): Secondary | ICD-10-CM | POA: Diagnosis not present

## 2018-07-17 DIAGNOSIS — N800 Endometriosis of uterus: Secondary | ICD-10-CM | POA: Diagnosis not present

## 2018-07-17 DIAGNOSIS — C541 Malignant neoplasm of endometrium: Secondary | ICD-10-CM | POA: Diagnosis not present

## 2018-07-17 DIAGNOSIS — E669 Obesity, unspecified: Secondary | ICD-10-CM | POA: Diagnosis not present

## 2018-07-17 DIAGNOSIS — I10 Essential (primary) hypertension: Secondary | ICD-10-CM | POA: Diagnosis not present

## 2018-07-17 DIAGNOSIS — Z79899 Other long term (current) drug therapy: Secondary | ICD-10-CM | POA: Insufficient documentation

## 2018-07-17 HISTORY — PX: ROBOTIC ASSISTED TOTAL HYSTERECTOMY WITH BILATERAL SALPINGO OOPHERECTOMY: SHX6086

## 2018-07-17 HISTORY — PX: SENTINEL NODE BIOPSY: SHX6608

## 2018-07-17 LAB — TYPE AND SCREEN
ABO/RH(D): O NEG
Antibody Screen: NEGATIVE

## 2018-07-17 SURGERY — HYSTERECTOMY, TOTAL, ROBOT-ASSISTED, LAPAROSCOPIC, WITH BILATERAL SALPINGO-OOPHORECTOMY
Anesthesia: General

## 2018-07-17 MED ORDER — STERILE WATER FOR INJECTION IJ SOLN
INTRAMUSCULAR | Status: AC
  Filled 2018-07-17: qty 10

## 2018-07-17 MED ORDER — PROMETHAZINE HCL 25 MG/ML IJ SOLN
6.2500 mg | INTRAMUSCULAR | Status: DC | PRN
  Administered 2018-07-17: 6.25 mg via INTRAVENOUS

## 2018-07-17 MED ORDER — SUGAMMADEX SODIUM 200 MG/2ML IV SOLN
INTRAVENOUS | Status: DC | PRN
  Administered 2018-07-17: 400 mg via INTRAVENOUS

## 2018-07-17 MED ORDER — ONDANSETRON HCL 4 MG/2ML IJ SOLN
INTRAMUSCULAR | Status: AC
  Filled 2018-07-17: qty 2

## 2018-07-17 MED ORDER — SCOPOLAMINE 1 MG/3DAYS TD PT72
1.0000 | MEDICATED_PATCH | TRANSDERMAL | Status: DC
  Administered 2018-07-17: 1.5 mg via TRANSDERMAL
  Filled 2018-07-17: qty 1

## 2018-07-17 MED ORDER — ENOXAPARIN SODIUM 40 MG/0.4ML ~~LOC~~ SOLN
40.0000 mg | SUBCUTANEOUS | Status: AC
  Administered 2018-07-17: 40 mg via SUBCUTANEOUS
  Filled 2018-07-17: qty 0.4

## 2018-07-17 MED ORDER — KETOROLAC TROMETHAMINE 15 MG/ML IJ SOLN
INTRAMUSCULAR | Status: AC
  Filled 2018-07-17: qty 1

## 2018-07-17 MED ORDER — ROCURONIUM BROMIDE 10 MG/ML (PF) SYRINGE
PREFILLED_SYRINGE | INTRAVENOUS | Status: DC | PRN
  Administered 2018-07-17: 10 mg via INTRAVENOUS
  Administered 2018-07-17: 50 mg via INTRAVENOUS
  Administered 2018-07-17: 10 mg via INTRAVENOUS

## 2018-07-17 MED ORDER — FENTANYL CITRATE (PF) 100 MCG/2ML IJ SOLN: 25.0000 ug | INTRAMUSCULAR | Status: DC | PRN

## 2018-07-17 MED ORDER — SUCCINYLCHOLINE CHLORIDE 200 MG/10ML IV SOSY
PREFILLED_SYRINGE | INTRAVENOUS | Status: DC | PRN
  Administered 2018-07-17: 120 mg via INTRAVENOUS

## 2018-07-17 MED ORDER — LACTATED RINGERS IV SOLN
INTRAVENOUS | Status: DC | PRN
  Administered 2018-07-17 (×2): via INTRAVENOUS

## 2018-07-17 MED ORDER — CEFAZOLIN SODIUM-DEXTROSE 2-4 GM/100ML-% IV SOLN
2.0000 g | INTRAVENOUS | Status: AC
  Administered 2018-07-17: 2 g via INTRAVENOUS
  Filled 2018-07-17: qty 100

## 2018-07-17 MED ORDER — HYDRALAZINE HCL 20 MG/ML IJ SOLN
INTRAMUSCULAR | Status: DC | PRN
  Administered 2018-07-17: 5 mg via INTRAVENOUS

## 2018-07-17 MED ORDER — DEXAMETHASONE SODIUM PHOSPHATE 10 MG/ML IJ SOLN
INTRAMUSCULAR | Status: DC | PRN
  Administered 2018-07-17: 10 mg via INTRAVENOUS

## 2018-07-17 MED ORDER — LACTATED RINGERS IR SOLN
Status: DC | PRN
  Administered 2018-07-17: 1000 mL

## 2018-07-17 MED ORDER — HYDROMORPHONE HCL 1 MG/ML IJ SOLN
INTRAMUSCULAR | Status: AC
  Filled 2018-07-17: qty 2

## 2018-07-17 MED ORDER — ONDANSETRON HCL 4 MG/2ML IJ SOLN: 4.0000 mg | Freq: Once | INTRAMUSCULAR | Status: DC | PRN

## 2018-07-17 MED ORDER — KETOROLAC TROMETHAMINE 15 MG/ML IJ SOLN
15.0000 mg | Freq: Once | INTRAMUSCULAR | Status: AC | PRN
  Administered 2018-07-17: 15 mg via INTRAVENOUS

## 2018-07-17 MED ORDER — ONDANSETRON HCL 4 MG/2ML IJ SOLN
INTRAMUSCULAR | Status: DC | PRN
  Administered 2018-07-17: 4 mg via INTRAVENOUS

## 2018-07-17 MED ORDER — SUFENTANIL CITRATE 50 MCG/ML IV SOLN
INTRAVENOUS | Status: AC
  Filled 2018-07-17: qty 1

## 2018-07-17 MED ORDER — MIDAZOLAM HCL 2 MG/2ML IJ SOLN
INTRAMUSCULAR | Status: AC
  Filled 2018-07-17: qty 2

## 2018-07-17 MED ORDER — BUPIVACAINE HCL 0.25 % IJ SOLN
INTRAMUSCULAR | Status: DC | PRN
  Administered 2018-07-17: 18 mL

## 2018-07-17 MED ORDER — STERILE WATER FOR INJECTION IJ SOLN
INTRAMUSCULAR | Status: DC | PRN
  Administered 2018-07-17: 5 mL

## 2018-07-17 MED ORDER — SUGAMMADEX SODIUM 500 MG/5ML IV SOLN
INTRAVENOUS | Status: AC
  Filled 2018-07-17: qty 5

## 2018-07-17 MED ORDER — DEXAMETHASONE SODIUM PHOSPHATE 4 MG/ML IJ SOLN
4.0000 mg | INTRAMUSCULAR | Status: DC
  Filled 2018-07-17: qty 1

## 2018-07-17 MED ORDER — SODIUM CHLORIDE 0.9% FLUSH: 3.0000 mL | Freq: Two times a day (BID) | INTRAVENOUS | Status: DC

## 2018-07-17 MED ORDER — KETOROLAC TROMETHAMINE 30 MG/ML IJ SOLN: 30.0000 mg | Freq: Once | INTRAMUSCULAR | Status: DC | PRN

## 2018-07-17 MED ORDER — DEXAMETHASONE SODIUM PHOSPHATE 10 MG/ML IJ SOLN
INTRAMUSCULAR | Status: AC
  Filled 2018-07-17: qty 1

## 2018-07-17 MED ORDER — BUPIVACAINE-EPINEPHRINE (PF) 0.25% -1:200000 IJ SOLN
INTRAMUSCULAR | Status: AC
  Filled 2018-07-17: qty 30

## 2018-07-17 MED ORDER — LACTATED RINGERS IV SOLN
INTRAVENOUS | Status: DC
  Administered 2018-07-17: 12:00:00 via INTRAVENOUS

## 2018-07-17 MED ORDER — GABAPENTIN 300 MG PO CAPS
300.0000 mg | ORAL_CAPSULE | ORAL | Status: AC
  Administered 2018-07-17: 300 mg via ORAL
  Filled 2018-07-17: qty 1

## 2018-07-17 MED ORDER — PROPOFOL 10 MG/ML IV BOLUS
INTRAVENOUS | Status: AC
  Filled 2018-07-17: qty 20

## 2018-07-17 MED ORDER — LABETALOL HCL 5 MG/ML IV SOLN
INTRAVENOUS | Status: DC | PRN
  Administered 2018-07-17: 2.5 mg via INTRAVENOUS

## 2018-07-17 MED ORDER — LABETALOL HCL 5 MG/ML IV SOLN
INTRAVENOUS | Status: AC
  Filled 2018-07-17: qty 4

## 2018-07-17 MED ORDER — PROPOFOL 10 MG/ML IV BOLUS
INTRAVENOUS | Status: DC | PRN
  Administered 2018-07-17: 110 mg via INTRAVENOUS

## 2018-07-17 MED ORDER — HYDRALAZINE HCL 20 MG/ML IJ SOLN
INTRAMUSCULAR | Status: AC
  Filled 2018-07-17: qty 1

## 2018-07-17 MED ORDER — MIDAZOLAM HCL 2 MG/2ML IJ SOLN
INTRAMUSCULAR | Status: DC | PRN
  Administered 2018-07-17 (×2): 1 mg via INTRAVENOUS

## 2018-07-17 MED ORDER — PROMETHAZINE HCL 25 MG/ML IJ SOLN
INTRAMUSCULAR | Status: AC
  Filled 2018-07-17: qty 1

## 2018-07-17 MED ORDER — LIDOCAINE 2% (20 MG/ML) 5 ML SYRINGE
INTRAMUSCULAR | Status: AC
  Filled 2018-07-17: qty 5

## 2018-07-17 MED ORDER — HYDROMORPHONE HCL 1 MG/ML IJ SOLN
0.2500 mg | INTRAMUSCULAR | Status: DC | PRN
  Administered 2018-07-17 (×4): 0.5 mg via INTRAVENOUS

## 2018-07-17 MED ORDER — KETOROLAC TROMETHAMINE 30 MG/ML IJ SOLN
INTRAMUSCULAR | Status: AC
  Filled 2018-07-17: qty 1

## 2018-07-17 MED ORDER — INDOCYANINE GREEN 25 MG IV SOLR
INTRAVENOUS | Status: DC | PRN
  Administered 2018-07-17: 2.5 mg

## 2018-07-17 MED ORDER — ROCURONIUM BROMIDE 10 MG/ML (PF) SYRINGE
PREFILLED_SYRINGE | INTRAVENOUS | Status: AC
  Filled 2018-07-17: qty 10

## 2018-07-17 MED ORDER — SUFENTANIL CITRATE 50 MCG/ML IV SOLN
INTRAVENOUS | Status: DC | PRN
  Administered 2018-07-17: 20 ug via INTRAVENOUS
  Administered 2018-07-17: 5 ug via INTRAVENOUS
  Administered 2018-07-17: 10 ug via INTRAVENOUS
  Administered 2018-07-17: 5 ug via INTRAVENOUS
  Administered 2018-07-17: 10 ug via INTRAVENOUS

## 2018-07-17 MED ORDER — LIDOCAINE 2% (20 MG/ML) 5 ML SYRINGE
INTRAMUSCULAR | Status: DC | PRN
  Administered 2018-07-17: 60 mg via INTRAVENOUS

## 2018-07-17 MED ORDER — BUPIVACAINE HCL (PF) 0.25 % IJ SOLN
INTRAMUSCULAR | Status: AC
  Filled 2018-07-17: qty 30

## 2018-07-17 SURGICAL SUPPLY — 53 items
APPLICATOR SURGIFLO ENDO (HEMOSTASIS) IMPLANT
BAG LAPAROSCOPIC 12 15 PORT 16 (BASKET) IMPLANT
BAG RETRIEVAL 12/15 (BASKET)
COVER BACK TABLE 60X90IN (DRAPES) ×3 IMPLANT
COVER TIP SHEARS 8 DVNC (MISCELLANEOUS) ×2 IMPLANT
COVER TIP SHEARS 8MM DA VINCI (MISCELLANEOUS) ×1
COVER WAND RF STERILE (DRAPES) IMPLANT
DECANTER SPIKE VIAL GLASS SM (MISCELLANEOUS) ×6 IMPLANT
DERMABOND ADVANCED (GAUZE/BANDAGES/DRESSINGS) ×1
DERMABOND ADVANCED .7 DNX12 (GAUZE/BANDAGES/DRESSINGS) ×2 IMPLANT
DRAIN CHANNEL RND F F (WOUND CARE) IMPLANT
DRAPE ARM DVNC X/XI (DISPOSABLE) ×8 IMPLANT
DRAPE COLUMN DVNC XI (DISPOSABLE) ×2 IMPLANT
DRAPE DA VINCI XI ARM (DISPOSABLE) ×4
DRAPE DA VINCI XI COLUMN (DISPOSABLE) ×1
DRAPE SHEET LG 3/4 BI-LAMINATE (DRAPES) ×3 IMPLANT
DRAPE SURG IRRIG POUCH 19X23 (DRAPES) ×3 IMPLANT
ELECT REM PT RETURN 15FT ADLT (MISCELLANEOUS) ×3 IMPLANT
GAUZE 4X4 16PLY RFD (DISPOSABLE) IMPLANT
GLOVE BIO SURGEON STRL SZ 6 (GLOVE) ×12 IMPLANT
GLOVE BIO SURGEON STRL SZ 6.5 (GLOVE) ×9 IMPLANT
GOWN STRL REUS W/ TWL LRG LVL3 (GOWN DISPOSABLE) ×10 IMPLANT
GOWN STRL REUS W/TWL LRG LVL3 (GOWN DISPOSABLE) ×5
HOLDER FOLEY CATH W/STRAP (MISCELLANEOUS) IMPLANT
IRRIG SUCT STRYKERFLOW 2 WTIP (MISCELLANEOUS) ×3
IRRIGATION SUCT STRKRFLW 2 WTP (MISCELLANEOUS) ×2 IMPLANT
KIT PROCEDURE DA VINCI SI (MISCELLANEOUS) ×1
KIT PROCEDURE DVNC SI (MISCELLANEOUS) ×2 IMPLANT
KIT TURNOVER KIT A (KITS) ×3 IMPLANT
MANIPULATOR UTERINE 4.5 ZUMI (MISCELLANEOUS) ×3 IMPLANT
NEEDLE HYPO 22GX1.5 SAFETY (NEEDLE) ×3 IMPLANT
NEEDLE SPNL 18GX3.5 QUINCKE PK (NEEDLE) ×3 IMPLANT
OBTURATOR OPTICAL STANDARD 8MM (TROCAR) ×1
OBTURATOR OPTICAL STND 8 DVNC (TROCAR) ×2
OBTURATOR OPTICALSTD 8 DVNC (TROCAR) ×2 IMPLANT
PACK ROBOT GYN CUSTOM WL (TRAY / TRAY PROCEDURE) ×3 IMPLANT
PAD POSITIONING PINK XL (MISCELLANEOUS) ×3 IMPLANT
PORT ACCESS TROCAR AIRSEAL 12 (TROCAR) ×2 IMPLANT
PORT ACCESS TROCAR AIRSEAL 5M (TROCAR) ×1
POUCH SPECIMEN RETRIEVAL 10MM (ENDOMECHANICALS) ×3 IMPLANT
SEAL CANN UNIV 5-8 DVNC XI (MISCELLANEOUS) ×8 IMPLANT
SEAL XI 5MM-8MM UNIVERSAL (MISCELLANEOUS) ×4
SET TRI-LUMEN FLTR TB AIRSEAL (TUBING) ×3 IMPLANT
SURGIFLO W/THROMBIN 8M KIT (HEMOSTASIS) IMPLANT
SUT VIC AB 0 CT1 27 (SUTURE) ×2
SUT VIC AB 0 CT1 27XBRD ANTBC (SUTURE) ×4 IMPLANT
SUT VIC AB 4-0 PS2 18 (SUTURE) ×6 IMPLANT
SYR 10ML LL (SYRINGE) ×3 IMPLANT
TOWEL OR NON WOVEN STRL DISP B (DISPOSABLE) ×3 IMPLANT
TRAP SPECIMEN MUCOUS 40CC (MISCELLANEOUS) IMPLANT
TRAY FOLEY MTR SLVR 16FR STAT (SET/KITS/TRAYS/PACK) ×3 IMPLANT
UNDERPAD 30X30 (UNDERPADS AND DIAPERS) ×3 IMPLANT
WATER STERILE IRR 1000ML POUR (IV SOLUTION) ×3 IMPLANT

## 2018-07-17 NOTE — Op Note (Signed)
OPERATIVE NOTE 07/17/18  Surgeon: Donaciano Eva   Assistants: Dr Lahoma Crocker (an MD assistant was necessary for tissue manipulation, management of robotic instrumentation, retraction and positioning due to the complexity of the case and hospital policies).   Anesthesia: General endotracheal anesthesia  ASA Class: 3   Pre-operative Diagnosis: endometrial cancer grade 1  Post-operative Diagnosis: same,   Operation: Robotic-assisted laparoscopic total hysterectomy with bilateral salpingoophorectomy, SLN biopsy, left pelvic lymphadenectomy.   Surgeon: Donaciano Eva  Assistant Surgeon: Lahoma Crocker MD  Anesthesia: GET  Urine Output: 200cc  Operative Findings:  : 7cm normal appearing uterus, tubes and ovaries, obesity (BMI 37kg/m2) causing limited visualization in the retroperitoneum and necessitated the addition of another robotic arm and instrument. No gross extrauterine disease.  Estimated Blood Loss:  20cc      Total IV Fluids: 800 ml         Specimens: uterus, cervix, bilateral tubes and ovaries, left pelvic lymph nodes, right obturator SLN          Complications:  None; patient tolerated the procedure well.         Disposition: PACU - hemodynamically stable.  Procedure Details  The patient was seen in the Holding Room. The risks, benefits, complications, treatment options, and expected outcomes were discussed with the patient.  The patient concurred with the proposed plan, giving informed consent.  The site of surgery properly noted/marked. The patient was identified as [name] and the procedure verified as a Robotic-assisted hysterectomy with bilateral salpingo oophorectomy with SLN biopsy. A Time Out was held and the above information confirmed.  After induction of anesthesia, the patient was draped and prepped in the usual sterile manner. Pt was placed in supine position after anesthesia and draped and prepped in the usual sterile manner. The  abdominal drape was placed after the CholoraPrep had been allowed to dry for 3 minutes.  Her arms were tucked to her side with all appropriate precautions.  The shoulders were stabilized with padded shoulder blocks applied to the acromium processes.  The patient was placed in the semi-lithotomy position in [place].  The perineum was prepped with Betadine. The patient was then prepped. Foley catheter was placed.  A sterile speculum was placed in the vagina.  The cervix was grasped with a single-tooth tenaculum. 2mg  total of ICG was injected into the cervical stroma at 2 and 9 o'clock with 1cc injected at a 1cm and 72mm depth (concentration 0.5mg /ml) in all locations. The cervix was dilated with Kennon Rounds dilators.  The ZUMI uterine manipulator with a medium colpotomizer ring was placed without difficulty.  A pneum occluder balloon was placed over the manipulator.  OG tube placement was confirmed and to suction.   Next, a 5 mm skin incision was made 1 cm below the subcostal margin in the midclavicular line.  The 5 mm Optiview port and scope was used for direct entry.  Opening pressure was under 10 mm CO2.  The abdomen was insufflated and the findings were noted as above.   At this point and all points during the procedure, the patient's intra-abdominal pressure did not exceed 15 mmHg. Next, a 10 mm skin incision was made in  the umbilicus and a right and left port was placed about 10 cm lateral to the robot port on the right and left side. A fourth arm was placed in the left lower quadrant 2 cm above and superior and medial to the anterior superior iliac spine.  All ports were  placed under direct visualization.  The patient was placed in steep Trendelenburg.  Bowel was folded away into the upper abdomen.  The robot was docked in the normal manner.  The right and left peritoneum were opened parallel to the IP ligament to open the retroperitoneal spaces bilaterally. The SLN mapping was performed in bilateral  pelvic basins. The para rectal and paravesical spaces were opened up entirely with careful dissection below the level of the ureters bilaterally and to the depth of the uterine artery origin in order to skeletonize the uterine "web" and ensure visualization of all parametrial channels. The para-aortic basins were carefully exposed and evaluated for isolated para-aortic SLN's. Lymphatic channels were identified travelling to the following visualized sentinel lymph node's: right obturator SLN. These SLN's were separated from their surrounding lymphatic tissue, removed and sent for permanent pathology.  Due to unilateral mapping a left sided lymphadenectomy was performed. The paravesical space was developed with monopolar and sharp dissection. It was held open with tension on the median umbilical ligament with the forth arm. The paravesical space was opened with blunt and sharp dissection to mobilize the ureter off of the medial surface of the internal iliac artery. The medial leaf of the broad ligament containing the ureter was held medially (opening the pararectal space) by the assistant's grasper. The left pelvic lymphadenectomy was performed by skeletonizing the internal iliac artery at the bifurcation with the external iliac artery. The obturator nerve was identified in the base of lateral paravesical space. The ureter was mobilized medially off of the dissection by developing the pararectal space. The genitofemoral nerve was identified, skeletonized and mobilized laterally off of the external iliac artery. An enbloc resection of lymph nodes was performed within the following boundaries: the mid portion of the common iliac proximally, the circumflex iliac vein distally, the obturator nerve posteriorally, the genitofemoral nerve laterally. The nodal basin (including obturator space) were confirmed to be empty of nodes and hemostatic. The nodes were placed in an endocatch bag and retrieved vaginally.   The  hysterectomy was started after the round ligament on the right side was incised and the retroperitoneum was entered and the pararectal space was developed.  The ureter was noted to be on the medial leaf of the broad ligament.  The peritoneum above the ureter was incised and stretched and the infundibulopelvic ligament was skeletonized, cauterized and cut.  The posterior peritoneum was taken down to the level of the KOH ring.  The anterior peritoneum was also taken down.  The bladder flap was created to the level of the KOH ring.  The uterine artery on the right side was skeletonized, cauterized and cut in the normal manner.  A similar procedure was performed on the left.  The colpotomy was made and the uterus, cervix, bilateral ovaries and tubes were amputated and delivered through the vagina.  Pedicles were inspected and excellent hemostasis was achieved.    The colpotomy at the vaginal cuff was closed with Vicryl on a CT1 needle in a running manner.  Irrigation was used and excellent hemostasis was achieved.  At this point in the procedure was completed.  Robotic instruments were removed under direct visulaization.  The robot was undocked. The 10 mm ports were closed with Vicryl on a UR-5 needle and the fascia was closed with 0 Vicryl on a UR-5 needle.  The skin was closed with 4-0 Vicryl in a subcuticular manner.  Dermabond was applied.  Sponge, lap and needle counts correct x 2.  The  patient was taken to the recovery room in stable condition.  The vagina was swabbed with  minimal bleeding noted.   All instrument and needle counts were correct x  3.   The patient was transferred to the recovery room in a stable condition.  Donaciano Eva, MD

## 2018-07-17 NOTE — Anesthesia Postprocedure Evaluation (Signed)
Anesthesia Post Note  Patient: Elizabeth Maldonado  Procedure(s) Performed: XI ROBOTIC ASSISTED TOTAL HYSTERECTOMY WITH BILATERAL SALPINGO OOPHORECTOMY AND LEFT PELVIC LYMPADECTOMY (Bilateral ) SENTINEL NODE BIOPSY (N/A )     Patient location during evaluation: PACU Anesthesia Type: General Level of consciousness: awake and alert Pain management: pain level controlled Vital Signs Assessment: post-procedure vital signs reviewed and stable Respiratory status: spontaneous breathing, nonlabored ventilation, respiratory function stable and patient connected to nasal cannula oxygen Cardiovascular status: blood pressure returned to baseline and stable Postop Assessment: no apparent nausea or vomiting Anesthetic complications: no    Last Vitals:  Vitals:   07/17/18 1430 07/17/18 1445  BP: (!) 151/72 (!) 157/74  Pulse: 69 75  Resp: 17 (!) 24  Temp:    SpO2: 100% 97%    Last Pain:  Vitals:   07/17/18 1451  TempSrc:   PainSc: 10-Worst pain ever                 Kazuko Clemence S

## 2018-07-17 NOTE — Interval H&P Note (Signed)
History and Physical Interval Note:  07/17/2018 11:56 AM  Elizabeth Maldonado  has presented today for surgery, with the diagnosis of LAPENDOMETRIAL CANCER.  The various methods of treatment have been discussed with the patient and family. After consideration of risks, benefits and other options for treatment, the patient has consented to  Procedure(s): XI ROBOTIC ASSISTED TOTAL HYSTERECTOMY WITH BILATERAL SALPINGO OOPHORECTOMY (Bilateral) SENTINEL NODE BIOPSY (N/A) as a surgical intervention.  The patient's history has been reviewed, patient examined, no change in status, stable for surgery.  I have reviewed the patient's chart and labs.  Questions were answered to the patient's satisfaction.     Thereasa Solo

## 2018-07-17 NOTE — Discharge Instructions (Signed)
Return to work: 4 weeks (2 weeks with physical restrictions).  Activity: 1. Be up and out of the bed during the day.  Take a nap if needed.  You may walk up steps but be careful and use the hand rail.  Stair climbing will tire you more than you think, you may need to stop part way and rest.   2. No lifting or straining for 4 weeks.  3. No driving for 1 weeks.  Do Not drive if you are taking narcotic pain medicine.  4. Shower daily.  Use soap and water on your incision and pat dry; don't rub.   5. No sexual activity and nothing in the vagina for 8 weeks.  Medications:  - Take ibuprofen and tylenol first line for pain control. Take these regularly (every 6 hours) to decrease the build up of pain.  - If necessary, for severe pain not relieved by ibuprofen, contact Dr [name] office and you will be prescribed percocet.  - While taking percocet you should take sennakot every night to reduce the likelihood of constipation. If this causes diarrhea, stop its use.  Diet: 1. Low sodium Heart Healthy Diet is recommended.  2. It is safe to use a laxative if you have difficulty moving your bowels.   Wound Care: 1. Keep clean and dry.  Shower daily.  Reasons to call the Doctor:   Fever - Oral temperature greater than 100.4 degrees Fahrenheit  Foul-smelling vaginal discharge  Difficulty urinating  Nausea and vomiting  Increased pain at the site of the incision that is unrelieved with pain medicine.  Difficulty breathing with or without chest pain  New calf pain especially if only on one side  Sudden, continuing increased vaginal bleeding with or without clots.   Follow-up: 1. See Elizabeth Maldonado in 4 weeks.  Contacts: For questions or concerns you should contact:  Dr. [name] at [phone] After hours and on week-ends call [phone] and ask to speak to the physician on call for Gynecologic Oncology  After Your Surgery  The information in this section will tell you what  to expect after your surgery, both during your stay and after you leave. You will learn how to safely recover from your surgery. Write down any questions you have and be sure to ask your doctor or nurse. What to Expect When you wake up after your surgery, you will be in the [facility] (P[facility]U) or your recovery room. A nurse will be monitoring your body temperature, blood pressure, pulse, and oxygen levels. You may have a urinary catheter in your bladder to help monitor the amount of urine you are making. It should come out before you go home. You will also have compression boots on your lower legs to help your circulation. Your pain medication will be given through an IV line or in tablet form. If you are having pain, tell your nurse. Your nurse will tell you how to recover from your surgery. Below are examples of ways you can help yourself recover safely.  You will be encouraged to walk with the help of your nurse or physical therapist. We will give you medication to relieve pain. Walking helps reduce the risk for blood clots and pneumonia. It also helps to stimulate your bowels so they begin working again.  Use your incentive spirometer. This will help your lungs expand, which prevents pneumonia. For more information, read How to Use Your Incentive Spirometer. Commonly Asked Questions Will I have pain after  surgery? Yes, you will have some pain after your surgery, especially in the first few days. Your doctor and nurse will ask you about your pain often. You will be given medication to manage your pain as needed. If your pain is not relieved, please tell your doctor or nurse. It is important to control your pain so you can cough, breathe deeply, use your incentive spirometer, and get out of bed and walk. Will I be able to eat? Yes, you will be able to eat a regular diet or eat as tolerated. You should start with foods that are soft and easy to digest such as apple sauce and chicken  noodle soup. Eat small meals frequently, and then advance to regular foods. If you experience bloating, gas, or cramps, limit high-fiber foods, including whole grain breads and cereal, nuts, seeds, salads, fresh fruit, broccoli, cabbage, and cauliflower. Will I have pain when I am home? The length of time each person has pain or discomfort varies. You may still have some pain when you go home and will probably be taking pain medication. Follow the guidelines below.  Take your medications as directed and as needed.  Call your doctor if the medication prescribed for you doesn't relieve your pain.  Don't drive or drink alcohol while you're taking prescription pain medication.  As your incision heals, you will have less pain and need less pain medication. A mild pain reliever such as acetaminophen (Tylenol) or ibuprofen (Advil) will relieve aches and discomfort. However, large quantities of acetaminophen may be harmful to your liver. Don't take more acetaminophen than the amount directed on the bottle or as instructed by your doctor or nurse.  Pain medication should help you as you resume your normal activities. Take enough medication to do your exercises comfortably. Pain medication is most effective 30 to 45 minutes after taking it.  Keep track of when you take your pain medication. Taking it when your pain first begins is more effective than waiting for the pain to get worse. Pain medication may cause constipation (having fewer bowel movements than what is normal for you). How can I prevent constipation?  Go to the bathroom at the same time every day. Your body will get used to going at that time.  If you feel the urge to go, don't put it off. Try to use the bathroom 5 to 15 minutes after meals.  After breakfast is a good time to move your bowels. The reflexes in your colon are strongest at this time.  Exercise, if you can. Walking is an excellent form of exercise.  Drink 8 (8-ounce)  glasses (2 liters) of liquids daily, if you can. Drink water, juices, soups, ice cream shakes, and other drinks that don't have caffeine. Drinks with caffeine, such as coffee and soda, pull fluid out of the body.  Slowly increase the fiber in your diet to 25 to 35 grams per day. Fruits, vegetables, whole grains, and cereals contain fiber. If you have an ostomy or have had recent bowel surgery, check with your doctor or nurse before making any changes in your diet.  Both over-the-counter and prescription medications are available to treat constipation. Start with 1 of the following over-the-counter medications first: o Docusate sodium (Colace) 100 mg. Take ___1__ capsules _2____ times a day. This is a stool softener that causes few side effects. Don't take it with mineral oil. o Polyethylene glycol (MiraLAX) 17 grams daily. o Senna (Senokot) 2 tablets at bedtime. This is a stimulant laxative,  which can cause cramping.  If you haven't had a bowel movement in 2 days, call your doctor or nurse. Can I shower? Yes, you should shower 24 hours after your surgery. Be sure to shower every day. Taking a warm shower is relaxing and can help decrease muscle aches. Use soap when you shower and gently wash your incision. Pat the areas dry with a towel after showering, and leave your incision uncovered (unless there is drainage). Call your doctor if you see any redness or drainage from your incision. Don't take tub baths until you discuss it with your doctor at the first appointment after your surgery. How do I care for my incisions? You will have several small incisions on your abdomen. The incisions are closed with Steri-Strips or Dermabond. You may also have square white dressings on your incisions (Primapore). You can remove these in the shower 24 hours after your surgery. You should clean your incisions with soap and water. If you go home with Steri-Strips on your incision, they will loosen and may fall off  by themselves. If they haven't fallen off within 10 days, you can remove them. If you go home with Dermabond over your sutures (stitches), it will also loosen and peel off. What are the most common symptoms after a hysterectomy? It's common for you to have some vaginal spotting or light bleeding. You should monitor this with a pad or a panty liner. If you have having heavy bleeding (bleeding through a pad or liner every 1 to 2 hours), call your doctor right away. It's also common to have some discomfort after surgery from the air that was pumped into your abdomen during surgery. To help with this, walk, drink plenty of liquids and make sure to take the stool softeners you received. When is it safe for me to drive? You may resume driving 2 weeks after surgery, as long as you are not taking pain medication that may make you drowsy. When can I resume sexual activity? Do not place anything in your vagina or have vaginal intercourse for 8 weeks after your surgery. Some people will need to wait longer than 8 weeks, so speak with your doctor before resuming sexual intercourse. Will I be able to travel? Yes, you can travel. If you are traveling by plane within a few weeks after your surgery, make sure you get up and walk every hour. Be sure to stretch your legs, drink plenty of liquids, and keep your feet elevated when possible. Will I need any supplies? Most people do not need any supplies after the surgery. In the rare case that you do need supplies, such as tubes or drains, your nurse will order them for you. When can I return to work? The time it takes to return to work depends on the type of work you do, the type of surgery you had, and how fast your body heals. Most people can return to work about 2 to 4 weeks after the surgery. What exercises can I do? Exercise will help you gain strength and feel better. Walking and stair climbing are excellent forms of exercise. Gradually increase the distance you  walk. Climb stairs slowly, resting or stopping as needed. Ask your doctor or nurse before starting more strenuous exercises. When can I lift heavy objects? Most people should not lift anything heavier than 10 pounds (4.5 kilograms) for at least 4 weeks after surgery. Speak with your doctor about when you can do heavy lifting. How can I cope with  my feelings? After surgery for a serious illness, you may have new and upsetting feelings. Many people say they felt weepy, sad, worried, nervous, irritable, and angry at one time or another. You may find that you can't control some of these feelings. If this happens, it's a good idea to seek emotional support. The first step in coping is to talk about how you feel. Family and friends can help. Your nurse, doctor, and social worker can reassure, support, and guide you. It's always a good idea to let these professionals know how you, your family, and your friends are feeling emotionally. Many resources are available to patients and their families. Whether you're in the hospital or at home, the nurses, doctors, and social workers are here to help you and your family and friends handle the emotional aspects of your illness. When is my first appointment after surgery? Your first appointment after surgery will be 2 to 4 weeks after surgery. Your nurse will give you instructions on how to make this appointment, including the phone number to call. What if I have other questions? If you have any questions or concerns, please talk with your doctor or nurse. You can reach them Monday through Friday from 9:00 am to 5:00 pm. After 5:00 pm, during the weekend, and on holidays, call [phone] and ask for the doctor on call for your doctor.   Have a temperature of 101 F (38.3 C) or higher  Have pain that does not get better with pain medication  Have redness, drainage, or swelling from your incisions

## 2018-07-17 NOTE — Anesthesia Preprocedure Evaluation (Signed)
Anesthesia Evaluation  Patient identified by MRN, date of birth, ID band Patient awake    Reviewed: Allergy & Precautions, NPO status , Patient's Chart, lab work & pertinent test results  Airway Mallampati: II  TM Distance: >3 FB Neck ROM: Full    Dental no notable dental hx.    Pulmonary neg pulmonary ROS,    Pulmonary exam normal breath sounds clear to auscultation       Cardiovascular hypertension, Normal cardiovascular exam Rhythm:Regular Rate:Normal     Neuro/Psych negative neurological ROS  negative psych ROS   GI/Hepatic negative GI ROS, Neg liver ROS,   Endo/Other  Hypothyroidism   Renal/GU negative Renal ROS  negative genitourinary   Musculoskeletal negative musculoskeletal ROS (+)   Abdominal   Peds negative pediatric ROS (+)  Hematology negative hematology ROS (+)   Anesthesia Other Findings   Reproductive/Obstetrics negative OB ROS                             Anesthesia Physical Anesthesia Plan  ASA: III  Anesthesia Plan: General   Post-op Pain Management:    Induction: Intravenous  PONV Risk Score and Plan: 3 and Ondansetron, Dexamethasone, Treatment may vary due to age or medical condition and Scopolamine patch - Pre-op  Airway Management Planned: Oral ETT  Additional Equipment:   Intra-op Plan:   Post-operative Plan: Extubation in OR  Informed Consent: I have reviewed the patients History and Physical, chart, labs and discussed the procedure including the risks, benefits and alternatives for the proposed anesthesia with the patient or authorized representative who has indicated his/her understanding and acceptance.     Dental advisory given  Plan Discussed with: CRNA and Surgeon  Anesthesia Plan Comments:         Anesthesia Quick Evaluation

## 2018-07-17 NOTE — Transfer of Care (Signed)
Immediate Anesthesia Transfer of Care Note  Patient: [facility]  Procedure(s) Performed: XI ROBOTIC ASSISTED TOTAL HYSTERECTOMY WITH BILATERAL SALPINGO OOPHORECTOMY AND LEFT PELVIC LYMPADECTOMY (Bilateral ) SENTINEL NODE BIOPSY (N/A )  Patient Location: PACU  Anesthesia Type:General  Level of Consciousness: drowsy  Airway & Oxygen Therapy: Patient Spontanous Breathing and Patient connected to face mask oxygen  Post-op Assessment: Report given to RN and Post -op Vital signs reviewed and stable  Post vital signs: Reviewed and stable  Last Vitals:  Vitals Value Taken Time  BP 151/74 07/17/18 1427  Temp    Pulse 69 07/17/18 1429  Resp    SpO2 100 % 07/17/18 1429  Vitals shown include unvalidated device data.  Last Pain:  Vitals:   07/17/18 1030  TempSrc:   PainSc: 0-No pain      Patients Stated Pain Goal: 3 (46/96/29 [id number])  Complications: No apparent anesthesia complications

## 2018-07-17 NOTE — Anesthesia Procedure Notes (Signed)
Procedure Name: Intubation Date/Time: 07/17/2018 12:20 PM Performed by: Sharlette Dense, CRNA Patient Re-evaluated:Patient Re-evaluated prior to induction Oxygen Delivery Method: Circle system utilized Preoxygenation: Pre-oxygenation with 100% oxygen Induction Type: IV induction and Rapid sequence Ventilation: Mask ventilation without difficulty and Oral airway inserted - appropriate to patient size Laryngoscope Size: Glidescope and 3 Grade View: Grade I Tube type: Oral Tube size: 7.0 mm Number of attempts: 2 Airway Equipment and Method: Video-laryngoscopy Placement Confirmation: ETT inserted through vocal cords under direct vision,  positive ETCO2 and breath sounds checked- equal and bilateral Secured at: 22 cm Tube secured with: Tape Dental Injury: Teeth and Oropharynx as per pre-operative assessment  Difficulty Due To: Difficulty was anticipated, Difficult Airway- due to reduced neck mobility and Difficult Airway- due to anterior larynx Future Recommendations: Recommend- induction with short-acting agent, and alternative techniques readily available Comments: Attempted with Miller 2 and had a grade 3 view.

## 2018-07-18 ENCOUNTER — Encounter (HOSPITAL_COMMUNITY): Payer: Self-pay | Admitting: Gynecologic Oncology

## 2018-07-18 ENCOUNTER — Telehealth: Payer: Self-pay

## 2018-07-18 NOTE — Telephone Encounter (Signed)
Elizabeth Maldonado states she is doing well after her surgery. Afebrile. Eating and drinking well. Suggested increasing fluids to 64 oz daily as she is having some burning with urination from the catheter.   No BM or passing gas that pt is aware of.  She was fasting  for a couple of days as her surgery was post phoned. One incision bleed some that evening. No more bleeding today.  Pt using ibuprofen and Oxycodone for pain with good effect. Pt knows to call the office at [phone] if she has any questions or concerns.

## 2018-07-22 ENCOUNTER — Telehealth: Payer: Self-pay | Admitting: Gynecologic Oncology

## 2018-07-22 NOTE — Telephone Encounter (Signed)
Error

## 2018-07-24 ENCOUNTER — Telehealth: Payer: Self-pay | Admitting: Gynecologic Oncology

## 2018-07-25 ENCOUNTER — Inpatient Hospital Stay: Payer: Medicare Other | Attending: Gynecologic Oncology | Admitting: Gynecologic Oncology

## 2018-07-25 NOTE — Progress Notes (Deleted)
Consult Note: Gyn-Onc  Consult was requested by Dr. Ihor Dow for the evaluation of Elizabeth Maldonado 73 y.o. female  CC:  No chief complaint on file.   Assessment/Plan:  [name]  is a 73 y.o.  year old with grade 1 endometrioid endometrial cancer.  A detailed discussion was held with the patient and her family with regard to to her endometrial cancer diagnosis. We discussed the standard management options for uterine cancer which includes surgery followed possibly by adjuvant therapy depending on the results of surgery. The options for surgical management include a hysterectomy and removal of the tubes and ovaries possibly with removal of pelvic and para-aortic lymph nodes.If feasible, a minimally invasive approach including a robotic hysterectomy or laparoscopic hysterectomy have benefits including shorter hospital stay, recovery time and better wound healing than with open surgery. The patient has been counseled about these surgical options and the risks of surgery in general including infection, bleeding, damage to surrounding structures (including bowel, bladder, ureters, nerves or vessels), and the postoperative risks of PE/ DVT, and lymphedema. I extensively reviewed the additional risks of robotic hysterectomy including possible need for conversion to open laparotomy.  I discussed positioning during surgery of trendelenberg and risks of minor facial swelling and care we take in preoperative positioning.  I discussed that her obesity places her at greater risk for these complications. I discussed that due to the narrowness of her postmenopausal vagina, she may require a minilaparotomy for specimen delivery.   After counseling and consideration of her options, she desires to proceed with robotic assisted total hysterectomy with bilateral sapingo-oophorectomy and SLN biopsy.   She will be seen by anesthesia for preoperative clearance and discussion of postoperative pain management.  She  was given the opportunity to ask questions, which were answered to her satisfaction, and she is agreement with the above mentioned plan of care.  HPI: [name] is a 73 year old P1 who is seen in consultation at the request of Dr Ihor Dow for a grade 1 endometrial adenocarcinoma.   Patient reports postmenopausal bleeding on Mother's Day of 2020.  This prompted her to discuss it with her primary care physician, Dr. Tamala Maldonado, followed by a consultation with Dr. Ihor Dow.  Dr. Ihor Dow performed a transvaginal ultrasound scan on June 05, 2018.  This revealed a uterus measuring 6.5 x 4 x 4.6 cm.  The endometrium measured 24 mm and contained a markedly heterogeneous endometrial complex with multiple cystic areas.  The left and right ovaries were not visualized.  An office sampling was attempted but was unsuccessful due to discomfort.  The patient was taken to the operating room on June 11, 2018 where a D&C was performed.  This was accompanied by a hysteroscopy and MyoSure polypectomy.  Intraoperative findings were significant for a large endometrial polyp.  Final pathology from this procedure revealed extensive complex atypical hyperplasia with foci suspicious for a well differentiated endometrioid adenocarcinoma.  The patient is obese with a BMI of 37.4 kg/m.  She is carries a diagnosis of hypertension, hypercholesterolemia, and hypothyroidism.  She has had 1 prior pregnancy with a cesarean delivery.  She has had a laparoscopic cholecystectomy and as a child an open appendectomy.  Menopause transitioned at age 58.  She reports that she was on hormone replacement therapy for about 5 years, she is unclear if this was unopposed estrogen hormone replacement therapy.  The patient's family history significant for brother with prostate cancer, a brother with lymphoma, and a father who had lung  cancer but was a smoker.  The patient is a retired Associate Professor for high school.  She lives with  her husband who is not of good underlying health.  Current Meds:  Outpatient Encounter Medications as of 07/25/2018  Medication Sig  . b complex vitamins tablet Take 1 tablet by mouth daily.  . Calcium Citrate-Vitamin D (CALCIUM + D PO) Take 1 tablet by mouth daily.  . Cholecalciferol (VITAMIN D) 125 MCG (5000 UT) CAPS Take 5,000 Units by mouth daily.  Marland Kitchen ibuprofen (ADVIL) 600 MG tablet Take 1 tablet (600 mg total) by mouth every 6 (six) hours as needed for moderate pain. For AFTER surgery only  . levothyroxine (SYNTHROID, LEVOTHROID) 75 MCG tablet Take 75 mcg by mouth daily.  . Multiple Vitamin (MULTIVITAMIN WITH MINERALS) TABS tablet Take 1 tablet by mouth daily.  Marland Kitchen oxyCODONE (OXY IR/ROXICODONE) 5 MG immediate release tablet Take 1 tablet (5 mg total) by mouth every 4 (four) hours as needed for severe pain. For AFTER surgery, do not take and drive  . senna-docusate (SENOKOT-S) 8.6-50 MG tablet Take 2 tablets by mouth at bedtime. For AFTER surgery, do not take if having diarrhea  . Tetrahydrozoline HCl (VISINE OP) Place 1 drop into both eyes daily as needed (irritation).  . vitamin C (ASCORBIC ACID) 500 MG tablet Take 500 mg by mouth daily.   No facility-administered encounter medications on file as of 07/25/2018.     Allergy:  Allergies  Allergen Reactions  . Crestor [Rosuvastatin] Other (See Comments)    myalgia  . Lipitor [Atorvastatin] Other (See Comments)    myalgia  . Acetaminophen Itching and Other (See Comments)    "hyperactivity"  . Vytorin [Ezetimibe-Simvastatin] Other (See Comments)    myalgia    Social Hx:   Social History   Socioeconomic History  . Marital status: Married    Spouse name: Not on file  . Number of children: Not on file  . Years of education: Not on file  . Highest education level: Not on file  Occupational History  . Not on file  Social Needs  . Financial resource strain: Not on file  . Food insecurity    Worry: Not on file    Inability: Not on  file  . Transportation needs    Medical: Not on file    Non-medical: Not on file  Tobacco Use  . Smoking status: Never Smoker  . Smokeless tobacco: Never Used  Substance and Sexual Activity  . Alcohol use: Yes    Comment: one to two beers a year  . Drug use: No  . Sexual activity: Not on file  Lifestyle  . Physical activity    Days per week: Not on file    Minutes per session: Not on file  . Stress: Not on file  Relationships  . Social Herbalist on phone: Not on file    Gets together: Not on file    Attends religious service: Not on file    Active member of club or organization: Not on file    Attends meetings of clubs or organizations: Not on file    Relationship status: Not on file  . Intimate partner violence    Fear of current or ex partner: Not on file    Emotionally abused: Not on file    Physically abused: Not on file    Forced sexual activity: Not on file  Other Topics Concern  . Not on file  Social History Narrative  .  Not on file    Past Surgical Hx:  Past Surgical History:  Procedure Laterality Date  . APPENDECTOMY    . BUNIONECTOMY    . CATARACT EXTRACTION    . CESAREAN SECTION    . CHOLECYSTECTOMY    . FOOT SURGERY Right 2011   bone graft   . HYSTEROSCOPY W/D&C N/A 06/11/2018   Procedure: DILATATION AND CURETTAGE /HYSTEROSCOPY WITH MYOSURE POLYPECTOMY;  Surgeon: Lavonia Drafts, MD;  Location: [place];  Service: Gynecology;  Laterality: N/A;  . NASAL SEPTUM SURGERY     at age 24  . ROBOTIC ASSISTED TOTAL HYSTERECTOMY WITH BILATERAL SALPINGO OOPHERECTOMY Bilateral 07/17/2018   Procedure: XI ROBOTIC ASSISTED TOTAL HYSTERECTOMY WITH BILATERAL SALPINGO OOPHORECTOMY AND LEFT PELVIC LYMPADECTOMY;  Surgeon: Everitt Amber, MD;  Location: [facility];  Service: Gynecology;  Laterality: Bilateral;  . SENTINEL NODE BIOPSY N/A 07/17/2018   Procedure: SENTINEL NODE BIOPSY;  Surgeon: Everitt Amber, MD;  Location: [facility];  Service: Gynecology;  Laterality: N/A;   . TONSILLECTOMY      Past Medical Hx:  Past Medical History:  Diagnosis Date  . Diverticulosis   . Elevated blood pressure reading    165/71 a pre-op appt, reports she has seen her primary care in the last few months and intheir office it was in the 195K systolic, was not started on meds at that time, says her bp has "been creeping up since all this stuff started" , denies acute cardiac symptoms   . Hearing loss    wears hearing aids  . History of anemia as a child   . Hypercholesteremia    diet controlled  . Hyperlipidemia 02/16/2016  . Hypothyroid   . Hypothyroidism 02/16/2016    Past Gynecological History:  C/s x 1, menopause age 56. No LMP recorded (lmp unknown). Patient is postmenopausal.  Family Hx:  Family History  Problem Relation Age of Onset  . Hypertension Mother   . Heart attack Mother   . Heart attack Father   . Lung cancer Father   . Diabetes Sister   . Cervical cancer Sister   . Skin cancer Sister   . Diabetes Brother   . CAD Brother   . Lymphoma Brother   . Skin cancer Brother   . Diabetes Brother   . Skin cancer Brother   . Skin cancer Brother   . Prostate cancer Brother   . Diverticulitis Brother   . Skin cancer Brother   . Skin cancer Sister     Review of Systems:  Constitutional  Feels well,    ENT Normal appearing ears and nares bilaterally Skin/Breast  No rash, sores, jaundice, itching, dryness Cardiovascular  No chest pain, shortness of breath, or edema  Pulmonary  No cough or wheeze.  Gastro Intestinal  No nausea, vomitting, or diarrhoea. No bright red blood per rectum, no abdominal pain, change in bowel movement, or constipation.  Genito Urinary  No frequency, urgency, dysuria, + postmenopausal bleeding  Musculo Skeletal  No myalgia, arthralgia, joint swelling or pain  Neurologic  No weakness, numbness, change in gait,  Psychology  No depression, anxiety, insomnia.   Vitals:  There were no vitals taken for this  visit.  Physical Exam: WD in NAD Neck  Supple NROM, without any enlargements.  Lymph Node Survey No cervical supraclavicular or inguinal adenopathy Cardiovascular  Pulse normal rate, regularity and rhythm. S1 and S2 normal.  Lungs  Clear to auscultation bilateraly, without wheezes/crackles/rhonchi. Good air movement.  Skin  No rash/lesions/breakdown  Psychiatry  Alert and oriented to person, place, and time  Abdomen  Normoactive bowel sounds, abdomen soft, non-tender and obese without evidence of hernia.  Back No CVA tenderness Genito Urinary  Vulva/vagina: Normal external female genitalia.  No lesions. No discharge or bleeding.  Bladder/urethra:  No lesions or masses, well supported bladder  Vagina: normal, narrow  Cervix: Normal appearing, no lesions.  Uterus:  Small, mobile, no parametrial involvement or nodularity.  Adnexa: no discretely palpable masses. Rectal  deferred Extremities  No bilateral cyanosis, clubbing or edema.   Thereasa Solo, MD  07/25/2018, 4:14 PM

## 2018-07-31 ENCOUNTER — Ambulatory Visit: Payer: Medicare Other | Admitting: Gynecologic Oncology

## 2018-08-09 ENCOUNTER — Other Ambulatory Visit: Payer: Self-pay

## 2018-08-09 ENCOUNTER — Encounter: Payer: Self-pay | Admitting: Gynecologic Oncology

## 2018-08-09 ENCOUNTER — Inpatient Hospital Stay: Payer: Medicare Other | Attending: Gynecologic Oncology | Admitting: Gynecologic Oncology

## 2018-08-09 VITALS — BP 127/59 | HR 79 | Temp 98.2°F | Resp 17 | Ht 62.5 in | Wt 209.2 lb

## 2018-08-09 DIAGNOSIS — Z90710 Acquired absence of both cervix and uterus: Secondary | ICD-10-CM | POA: Diagnosis not present

## 2018-08-09 DIAGNOSIS — Z90722 Acquired absence of ovaries, bilateral: Secondary | ICD-10-CM

## 2018-08-09 DIAGNOSIS — C541 Malignant neoplasm of endometrium: Secondary | ICD-10-CM | POA: Diagnosis not present

## 2018-08-09 DIAGNOSIS — Z7189 Other specified counseling: Secondary | ICD-10-CM

## 2018-08-09 NOTE — Patient Instructions (Signed)
Please notify Dr Denman George at phone number [phone] if you notice vaginal bleeding, new pelvic or abdominal pains, bloating, feeling full easy, or a change in bladder or bowel function.   Please contact Dr [name] office (at [phone]) in October, 2020 to request an appointment with her for February, 2021.

## 2018-08-09 NOTE — Progress Notes (Signed)
Follow-up Note: Gyn-Onc  Consult was initially  requested by Dr. Ihor Dow for the evaluation of Elizabeth Maldonado 73 y.o. female  CC:  Chief Complaint  Patient presents with  . endometrial cancer    Assessment/Plan:  Elizabeth Maldonado  is a 73 y.o.  year old with a history of stage IA grade 1 endometrioid endometrial cancer s/p robotic staging procedure on 07/16/18.  Pathology revealed low risk factors for recurrence, therefore no adjuvant therapy is recommended according to NCCN guidelines.  I discussed risk for recurrence and typical symptoms encouraged her to notify us of these should they develop between visits.  I recommend she have follow-up every 6 months for 5 years in accordance with NCCN guidelines. Those visits should include symptom assessment, physical exam and pelvic examination. Pap smears are not indicated or recommended in the routine surveillance of endometrial cancer.  She will follow-up for 6 monthly evaluations.   HPI: [name] is a 73 year old P1 who is seen in consultation at the request of Dr Ihor Dow for a grade 1 endometrial adenocarcinoma.   Patient reports postmenopausal bleeding on Mother's Day of 2020.  This prompted her to discuss it with her primary care physician, Dr. Tamala Julian, followed by a consultation with Dr. Ihor Dow.  Dr. Ihor Dow performed a transvaginal ultrasound scan on June 05, 2018.  This revealed a uterus measuring 6.5 x 4 x 4.6 cm.  The endometrium measured 24 mm and contained a markedly heterogeneous endometrial complex with multiple cystic areas.  The left and right ovaries were not visualized.  An office sampling was attempted but was unsuccessful due to discomfort.  The patient was taken to the operating room on June 11, 2018 where a D&C was performed.  This was accompanied by a hysteroscopy and MyoSure polypectomy.  Intraoperative findings were significant for a large endometrial polyp.  Final pathology from this procedure  revealed extensive complex atypical hyperplasia with foci suspicious for a well differentiated endometrioid adenocarcinoma.  The patient is obese with a BMI of 37.4 kg/m.  She is carries a diagnosis of hypertension, hypercholesterolemia, and hypothyroidism.  She has had 1 prior pregnancy with a cesarean delivery.  She has had a laparoscopic cholecystectomy and as a child an open appendectomy.  Menopause transitioned at age 62.  She reports that she was on hormone replacement therapy for about 5 years, she is unclear if this was unopposed estrogen hormone replacement therapy.  The patient's family history significant for brother with prostate cancer, a brother with lymphoma, and a father who had lung cancer but was a smoker.  The patient is a retired Associate Professor for high school.  She lives with her husband who is not of good underlying health.  Interval Hx:  On 07/16/18 she underwent robotic assisted total hysterectomy, BSO, SLN biopsy and left pelvic lymphadenectomy. Final pathology showed no residual carcinoma in the hysterectomy specimen.   She was assigned stage IA grade 1 endometrial cancer with low risk features. No adjuvant therapy was recommended based on final pathology and NCCN guidelines.   Since surgery she has done well with no complaints.   Current Meds:  Outpatient Encounter Medications as of 08/09/2018  Medication Sig  . b complex vitamins tablet Take 1 tablet by mouth daily.  . Calcium Citrate-Vitamin D (CALCIUM + D PO) Take 1 tablet by mouth daily.  . Cholecalciferol (VITAMIN D) 125 MCG (5000 UT) CAPS Take 5,000 Units by mouth daily.  Marland Kitchen ibuprofen (ADVIL) 600 MG tablet Take 1 tablet (  600 mg total) by mouth every 6 (six) hours as needed for moderate pain. For AFTER surgery only  . levothyroxine (SYNTHROID, LEVOTHROID) 75 MCG tablet Take 75 mcg by mouth daily.  . Multiple Vitamin (MULTIVITAMIN WITH MINERALS) TABS tablet Take 1 tablet by mouth daily.  Marland Kitchen oxyCODONE (OXY  IR/ROXICODONE) 5 MG immediate release tablet Take 1 tablet (5 mg total) by mouth every 4 (four) hours as needed for severe pain. For AFTER surgery, do not take and drive  . senna-docusate (SENOKOT-S) 8.6-50 MG tablet Take 2 tablets by mouth at bedtime. For AFTER surgery, do not take if having diarrhea  . Tetrahydrozoline HCl (VISINE OP) Place 1 drop into both eyes daily as needed (irritation).  . vitamin C (ASCORBIC ACID) 500 MG tablet Take 500 mg by mouth daily.   No facility-administered encounter medications on file as of 08/09/2018.     Allergy:  Allergies  Allergen Reactions  . Crestor [Rosuvastatin] Other (See Comments)    myalgia  . Lipitor [Atorvastatin] Other (See Comments)    myalgia  . Acetaminophen Itching and Other (See Comments)    "hyperactivity"  . Vytorin [Ezetimibe-Simvastatin] Other (See Comments)    myalgia    Social Hx:   Social History   Socioeconomic History  . Marital status: Married    Spouse name: Not on file  . Number of children: Not on file  . Years of education: Not on file  . Highest education level: Not on file  Occupational History  . Not on file  Social Needs  . Financial resource strain: Not on file  . Food insecurity    Worry: Not on file    Inability: Not on file  . Transportation needs    Medical: Not on file    Non-medical: Not on file  Tobacco Use  . Smoking status: Never Smoker  . Smokeless tobacco: Never Used  Substance and Sexual Activity  . Alcohol use: Yes    Comment: one to two beers a year  . Drug use: No  . Sexual activity: Not on file  Lifestyle  . Physical activity    Days per week: Not on file    Minutes per session: Not on file  . Stress: Not on file  Relationships  . Social Herbalist on phone: Not on file    Gets together: Not on file    Attends religious service: Not on file    Active member of club or organization: Not on file    Attends meetings of clubs or organizations: Not on file     Relationship status: Not on file  . Intimate partner violence    Fear of current or ex partner: Not on file    Emotionally abused: Not on file    Physically abused: Not on file    Forced sexual activity: Not on file  Other Topics Concern  . Not on file  Social History Narrative  . Not on file    Past Surgical Hx:  Past Surgical History:  Procedure Laterality Date  . APPENDECTOMY    . BUNIONECTOMY    . CATARACT EXTRACTION    . CESAREAN SECTION    . CHOLECYSTECTOMY    . FOOT SURGERY Right 2011   bone graft   . HYSTEROSCOPY W/D&C N/A 06/11/2018   Procedure: DILATATION AND CURETTAGE /HYSTEROSCOPY WITH MYOSURE POLYPECTOMY;  Surgeon: Lavonia Drafts, MD;  Location: [place];  Service: Gynecology;  Laterality: N/A;  . NASAL SEPTUM SURGERY  at age 67  . ROBOTIC ASSISTED TOTAL HYSTERECTOMY WITH BILATERAL SALPINGO OOPHERECTOMY Bilateral 07/17/2018   Procedure: XI ROBOTIC ASSISTED TOTAL HYSTERECTOMY WITH BILATERAL SALPINGO OOPHORECTOMY AND LEFT PELVIC LYMPADECTOMY;  Surgeon: Everitt Amber, MD;  Location: [facility];  Service: Gynecology;  Laterality: Bilateral;  . SENTINEL NODE BIOPSY N/A 07/17/2018   Procedure: SENTINEL NODE BIOPSY;  Surgeon: Everitt Amber, MD;  Location: [facility];  Service: Gynecology;  Laterality: N/A;  . TONSILLECTOMY      Past Medical Hx:  Past Medical History:  Diagnosis Date  . Diverticulosis   . Elevated blood pressure reading    165/71 a pre-op appt, reports she has seen her primary care in the last few months and intheir office it was in the 387F systolic, was not started on meds at that time, says her bp has "been creeping up since all this stuff started" , denies acute cardiac symptoms   . Hearing loss    wears hearing aids  . History of anemia as a child   . Hypercholesteremia    diet controlled  . Hyperlipidemia 02/16/2016  . Hypothyroid   . Hypothyroidism 02/16/2016    Past Gynecological History:  C/s x 1, menopause age 75. No LMP recorded (lmp unknown).  Patient is postmenopausal.  Family Hx:  Family History  Problem Relation Age of Onset  . Hypertension Mother   . Heart attack Mother   . Heart attack Father   . Lung cancer Father   . Diabetes Sister   . Cervical cancer Sister   . Skin cancer Sister   . Diabetes Brother   . CAD Brother   . Lymphoma Brother   . Skin cancer Brother   . Diabetes Brother   . Skin cancer Brother   . Skin cancer Brother   . Prostate cancer Brother   . Diverticulitis Brother   . Skin cancer Brother   . Skin cancer Sister     Review of Systems:  Constitutional  Feels well,    ENT Normal appearing ears and nares bilaterally Skin/Breast  No rash, sores, jaundice, itching, dryness Cardiovascular  No chest pain, shortness of breath, or edema  Pulmonary  No cough or wheeze.  Gastro Intestinal  No nausea, vomitting, or diarrhoea. No bright red blood per rectum, no abdominal pain, change in bowel movement, or constipation.  Genito Urinary  No frequency, urgency, dysuria, no bleeding Musculo Skeletal  No myalgia, arthralgia, joint swelling or pain  Neurologic  No weakness, numbness, change in gait,  Psychology  No depression, anxiety, insomnia.   Vitals:  Blood pressure (!) 127/59, pulse 79, temperature 98.2 F (36.8 C), temperature source Oral, resp. rate 17, height 5' 2.5" (1.588 m), weight 209 lb 3.2 oz (94.9 kg), SpO2 99 %.  Physical Exam: WD in NAD Neck  Supple NROM, without any enlargements.  Lymph Node Survey No cervical supraclavicular or inguinal adenopathy Cardiovascular  Pulse normal rate, regularity and rhythm. S1 and S2 normal.  Lungs  Clear to auscultation bilateraly, without wheezes/crackles/rhonchi. Good air movement.  Skin  No rash/lesions/breakdown  Psychiatry  Alert and oriented to person, place, and time  Abdomen  Normoactive bowel sounds, abdomen soft, non-tender and obese without evidence of hernia. Well healed abdominal incisions.  Back No CVA  tenderness Genito Urinary  Vaginal cuff in tact, healing normally  Rectal  deferred Extremities  No bilateral cyanosis, clubbing or edema.   20 minutes of direct face to face counseling time was spent with the patient. This included discussion  about prognosis, therapy recommendations and postoperative side effects and are beyond the scope of routine postoperative care.  Thereasa Solo, MD  08/09/2018, 4:17 PM

## 2018-08-29 ENCOUNTER — Telehealth: Payer: Self-pay

## 2018-08-29 ENCOUNTER — Other Ambulatory Visit: Payer: Self-pay

## 2018-08-29 ENCOUNTER — Inpatient Hospital Stay: Payer: Medicare Other

## 2018-08-29 ENCOUNTER — Other Ambulatory Visit: Payer: Self-pay | Admitting: Gynecologic Oncology

## 2018-08-29 DIAGNOSIS — R1030 Lower abdominal pain, unspecified: Secondary | ICD-10-CM

## 2018-08-29 DIAGNOSIS — R300 Dysuria: Secondary | ICD-10-CM

## 2018-08-29 DIAGNOSIS — C541 Malignant neoplasm of endometrium: Secondary | ICD-10-CM | POA: Diagnosis not present

## 2018-08-29 LAB — URINALYSIS, COMPLETE (UACMP) WITH MICROSCOPIC
Bilirubin Urine: NEGATIVE
Glucose, UA: NEGATIVE mg/dL
Hgb urine dipstick: NEGATIVE
Ketones, ur: NEGATIVE mg/dL
Leukocytes,Ua: NEGATIVE
Nitrite: NEGATIVE
Protein, ur: NEGATIVE mg/dL
Specific Gravity, Urine: 1.005 (ref 1.005–1.030)
pH: 6 (ref 5.0–8.0)

## 2018-08-29 NOTE — Progress Notes (Signed)
See RN note.  New abd pain and urinary incontinence x 3 days, surgery was on 07/17/2018

## 2018-08-29 NOTE — Telephone Encounter (Signed)
[name] states that for the last 3 days that she has had lower abdominal pressure and pain. Using ibuprofen 600 mg prn for discomfort.  She is experiencing urinary incontinence right after emptying bladder.   No foul odor or pain with urination. Afebrile.  Will have patient come in for a UA, C&S today at 1200

## 2018-08-30 ENCOUNTER — Other Ambulatory Visit: Payer: Self-pay

## 2018-08-30 ENCOUNTER — Inpatient Hospital Stay: Payer: Medicare Other

## 2018-08-30 ENCOUNTER — Telehealth: Payer: Self-pay | Admitting: *Deleted

## 2018-08-30 DIAGNOSIS — C541 Malignant neoplasm of endometrium: Secondary | ICD-10-CM

## 2018-08-30 LAB — BASIC METABOLIC PANEL - CANCER CENTER ONLY
Anion gap: 9 (ref 5–15)
BUN: 19 mg/dL (ref 8–23)
CO2: 26 mmol/L (ref 22–32)
Calcium: 9.1 mg/dL (ref 8.9–10.3)
Chloride: 105 mmol/L (ref 98–111)
Creatinine: 0.99 mg/dL (ref 0.44–1.00)
GFR, Est AFR Am: >60 mL/min (ref >60)
GFR, Estimated: 57 mL/min — ABNORMAL LOW (ref >60)
Glucose, Bld: 94 mg/dL (ref 70–99)
Potassium: 5.2 mmol/L — ABNORMAL HIGH (ref 3.5–5.1)
Sodium: 140 mmol/L (ref 135–145)

## 2018-08-30 LAB — URINE CULTURE: Culture: <10000 — AB

## 2018-08-30 NOTE — Telephone Encounter (Signed)
LM for [name] hone to call back to discuss appointment for CT Scan and lab work at [facility] prior to CT Scan.

## 2018-08-30 NOTE — Telephone Encounter (Signed)
Told Ms [facility] that she is scheduled on 09-05-18 at 1330 for CT scan.  Pt to p/u contrast and directions for CT today when pt comes in for labs at 1215. Contrast and directions up front for patient to pick up.

## 2018-09-05 ENCOUNTER — Encounter (HOSPITAL_COMMUNITY): Payer: Self-pay

## 2018-09-05 ENCOUNTER — Ambulatory Visit (HOSPITAL_COMMUNITY)
Admission: RE | Admit: 2018-09-05 | Discharge: 2018-09-05 | Disposition: A | Discharge status: Home / Self Care | Payer: Medicare Other | Source: Ambulatory Visit | Attending: Gynecologic Oncology | Admitting: Gynecologic Oncology

## 2018-09-05 ENCOUNTER — Other Ambulatory Visit: Payer: Self-pay

## 2018-09-05 DIAGNOSIS — R1030 Lower abdominal pain, unspecified: Secondary | ICD-10-CM | POA: Diagnosis present

## 2018-09-05 MED ORDER — IOHEXOL 300 MG/ML  SOLN
100.0000 mL | Freq: Once | INTRAMUSCULAR | Status: AC | PRN
  Administered 2018-09-05: 100 mL via INTRAVENOUS

## 2018-09-05 MED ORDER — SODIUM CHLORIDE (PF) 0.9 % IJ SOLN
INTRAMUSCULAR | Status: AC
  Filled 2018-09-05: qty 50

## 2018-09-06 ENCOUNTER — Telehealth: Payer: Self-pay

## 2018-09-06 NOTE — Telephone Encounter (Signed)
I spoke with [name] this am. [name] the CT scan from 09/05/2018 showed lymphatic fluid leak. Per Elizabeth John, NP the fluid is leaking through the cuff .  She stated that she is still having lower left sided abdominal pain.     The patient is taking ibuprofen 600 mg for this pain and she is getting some relief. She states "[name].'  The patient asked if that was revealed in the Ct scan. [name] it was not, but that I was faxing a copy of the ct scan report to her PCP, Elizabeth Maldonado. Per Elizabeth John, NP I told the patient that if she feels this is the diverticulitis to contact her PCP for evaluation.

## 2018-09-13 NOTE — Telephone Encounter (Signed)
error 

## 2018-11-12 ENCOUNTER — Telehealth: Payer: Self-pay | Admitting: *Deleted

## 2018-11-12 NOTE — Telephone Encounter (Signed)
Patient called and scheduled an appt in Jan

## 2019-02-12 ENCOUNTER — Inpatient Hospital Stay: Payer: Medicare Other | Admitting: Gynecologic Oncology

## 2019-02-27 ENCOUNTER — Inpatient Hospital Stay: Payer: Medicare PPO | Attending: Gynecologic Oncology | Admitting: Gynecologic Oncology

## 2019-02-27 ENCOUNTER — Ambulatory Visit: Payer: Medicare PPO | Attending: Internal Medicine

## 2019-02-27 ENCOUNTER — Other Ambulatory Visit: Payer: Self-pay

## 2019-02-27 ENCOUNTER — Encounter: Payer: Self-pay | Admitting: Gynecologic Oncology

## 2019-02-27 VITALS — BP 128/64 | HR 73 | Temp 98.0°F | Resp 17 | Ht 62.5 in | Wt 207.5 lb

## 2019-02-27 DIAGNOSIS — Z8049 Family history of malignant neoplasm of other genital organs: Secondary | ICD-10-CM | POA: Insufficient documentation

## 2019-02-27 DIAGNOSIS — Z807 Family history of other malignant neoplasms of lymphoid, hematopoietic and related tissues: Secondary | ICD-10-CM | POA: Insufficient documentation

## 2019-02-27 DIAGNOSIS — E785 Hyperlipidemia, unspecified: Secondary | ICD-10-CM | POA: Insufficient documentation

## 2019-02-27 DIAGNOSIS — E669 Obesity, unspecified: Secondary | ICD-10-CM | POA: Diagnosis not present

## 2019-02-27 DIAGNOSIS — Z90722 Acquired absence of ovaries, bilateral: Secondary | ICD-10-CM | POA: Insufficient documentation

## 2019-02-27 DIAGNOSIS — Z8249 Family history of ischemic heart disease and other diseases of the circulatory system: Secondary | ICD-10-CM | POA: Diagnosis not present

## 2019-02-27 DIAGNOSIS — I10 Essential (primary) hypertension: Secondary | ICD-10-CM | POA: Insufficient documentation

## 2019-02-27 DIAGNOSIS — Z801 Family history of malignant neoplasm of trachea, bronchus and lung: Secondary | ICD-10-CM | POA: Insufficient documentation

## 2019-02-27 DIAGNOSIS — E039 Hypothyroidism, unspecified: Secondary | ICD-10-CM | POA: Diagnosis not present

## 2019-02-27 DIAGNOSIS — Z90710 Acquired absence of both cervix and uterus: Secondary | ICD-10-CM | POA: Insufficient documentation

## 2019-02-27 DIAGNOSIS — Z8542 Personal history of malignant neoplasm of other parts of uterus: Secondary | ICD-10-CM | POA: Insufficient documentation

## 2019-02-27 DIAGNOSIS — Z791 Long term (current) use of non-steroidal anti-inflammatories (NSAID): Secondary | ICD-10-CM | POA: Diagnosis not present

## 2019-02-27 DIAGNOSIS — Z8042 Family history of malignant neoplasm of prostate: Secondary | ICD-10-CM | POA: Insufficient documentation

## 2019-02-27 DIAGNOSIS — Z23 Encounter for immunization: Secondary | ICD-10-CM | POA: Insufficient documentation

## 2019-02-27 DIAGNOSIS — Z79899 Other long term (current) drug therapy: Secondary | ICD-10-CM | POA: Diagnosis not present

## 2019-02-27 DIAGNOSIS — Z833 Family history of diabetes mellitus: Secondary | ICD-10-CM | POA: Insufficient documentation

## 2019-02-27 DIAGNOSIS — E7800 Pure hypercholesterolemia, unspecified: Secondary | ICD-10-CM | POA: Insufficient documentation

## 2019-02-27 DIAGNOSIS — Z9079 Acquired absence of other genital organ(s): Secondary | ICD-10-CM | POA: Diagnosis not present

## 2019-02-27 DIAGNOSIS — C541 Malignant neoplasm of endometrium: Secondary | ICD-10-CM

## 2019-02-27 NOTE — Patient Instructions (Signed)
Please notify Dr Denman George at phone number [phone] if you notice vaginal bleeding, new pelvic or abdominal pains, bloating, feeling full easy, or a change in bladder or bowel function.   Please contact Dr [name] office (at [phone]) in April to request an appointment with her for August, 2021.

## 2019-02-27 NOTE — Progress Notes (Signed)
   Covid-19 Vaccination Clinic  Name:  Elizabeth Maldonado    MRN: [id number] DOB: 1945/04/23  02/27/2019  Elizabeth Maldonado was observed post Covid-19 immunization for 15 minutes without incidence. She was provided with Vaccine Information Sheet and instruction to access the V-Safe system.   Elizabeth Maldonado was instructed to call 911 with any severe reactions post vaccine: Marland Kitchen Difficulty breathing  . Swelling of your face and throat  . A fast heartbeat  . A bad rash all over your body  . Dizziness and weakness    Immunizations Administered    Name Date Dose VIS Date Route   Pfizer COVID-19 Vaccine 02/27/2019  2:07 PM 0.3 mL 12/13/2018 Intramuscular   Manufacturer: [place]   Lot: [id number]   Protivin: [id number]

## 2019-02-27 NOTE — Progress Notes (Signed)
Follow-up Note: Gyn-Onc  Consult was initially  requested by Dr. Ihor Dow for the evaluation of Elizabeth Maldonado 74 y.o. female  CC:  Chief Complaint  Patient presents with  . Endometrial cancer ([place])    Follow up    Assessment/Plan:  [name]  is a 74 y.o.  year old with a history of stage IA grade 1 endometrioid endometrial cancer s/p robotic staging procedure on 07/16/18.  Pathology revealed low risk factors for recurrence, therefore no adjuvant therapy is recommended according to NCCN guidelines.  I discussed risk for recurrence and typical symptoms encouraged her to notify us of these should they develop between visits.  She will follow-up every 6 months for 5 years in accordance with NCCN guidelines. Those visits should include symptom assessment, physical exam and pelvic examination. Pap smears are not indicated or recommended in the routine surveillance of endometrial cancer.  She continue to will follow-up for me for 6 monthly evaluations.   Survivorship care plan given 02/27/19.  HPI: [name] is a 74 year old P1 who is seen in consultation at the request of Dr Ihor Dow for a grade 1 endometrial adenocarcinoma.   Patient reports postmenopausal bleeding on Mother's Day of 2020.  This prompted her to discuss it with her primary care physician, Dr. Tamala Julian, followed by a consultation with Dr. Ihor Dow.  Dr. Ihor Dow performed a transvaginal ultrasound scan on June 05, 2018.  This revealed a uterus measuring 6.5 x 4 x 4.6 cm.  The endometrium measured 24 mm and contained a markedly heterogeneous endometrial complex with multiple cystic areas.  The left and right ovaries were not visualized.  An office sampling was attempted but was unsuccessful due to discomfort.  The patient was taken to the operating room on June 11, 2018 where a D&C was performed.  This was accompanied by a hysteroscopy and MyoSure polypectomy.  Intraoperative findings were significant  for a large endometrial polyp.  Final pathology from this procedure revealed extensive complex atypical hyperplasia with foci suspicious for a well differentiated endometrioid adenocarcinoma.  The patient is obese with a BMI of 37.4 kg/m.  She is carries a diagnosis of hypertension, hypercholesterolemia, and hypothyroidism.  She has had 1 prior pregnancy with a cesarean delivery.  She has had a laparoscopic cholecystectomy and as a child an open appendectomy.  Menopause transitioned at age 31.  She reports that she was on hormone replacement therapy for about 5 years, she is unclear if this was unopposed estrogen hormone replacement therapy.  The patient's family history significant for brother with prostate cancer, a brother with lymphoma, and a father who had lung cancer but was a smoker.  The patient is a retired Associate Professor for high school.  She lives with her husband who is not of good underlying health.  On 07/16/18 she underwent robotic assisted total hysterectomy, BSO, SLN biopsy and left pelvic lymphadenectomy. Final pathology showed no residual carcinoma in the hysterectomy specimen.   She was assigned stage IA grade 1 endometrial cancer with low risk features. No adjuvant therapy was recommended based on final pathology and NCCN guidelines.   Interval Hx:  She has no symptoms concerning for recurrence and no complaints or toxicities from therapy  Current Meds:  Outpatient Encounter Medications as of 02/27/2019  Medication Sig  . b complex vitamins tablet Take 1 tablet by mouth daily.  . Calcium Citrate-Vitamin D (CALCIUM + D PO) Take 1 tablet by mouth daily.  . Cholecalciferol (VITAMIN D) 125 MCG (5000  UT) CAPS Take 5,000 Units by mouth daily.  Marland Kitchen levothyroxine (SYNTHROID, LEVOTHROID) 75 MCG tablet Take 75 mcg by mouth daily.  . Multiple Vitamin (MULTIVITAMIN WITH MINERALS) TABS tablet Take 1 tablet by mouth daily.  Marland Kitchen REPATHA SURECLICK [id number] MG/ML SOAJ   . Tetrahydrozoline HCl  (VISINE OP) Place 1 drop into both eyes daily as needed (irritation).  . vitamin C (ASCORBIC ACID) 500 MG tablet Take 500 mg by mouth daily.  . [DISCONTINUED] ibuprofen (ADVIL) 600 MG tablet Take 1 tablet (600 mg total) by mouth every 6 (six) hours as needed for moderate pain. For AFTER surgery only (Patient not taking: Reported on 02/27/2019)  . [DISCONTINUED] oxyCODONE (OXY IR/ROXICODONE) 5 MG immediate release tablet Take 1 tablet (5 mg total) by mouth every 4 (four) hours as needed for severe pain. For AFTER surgery, do not take and drive (Patient not taking: Reported on 02/27/2019)  . [DISCONTINUED] senna-docusate (SENOKOT-S) 8.6-50 MG tablet Take 2 tablets by mouth at bedtime. For AFTER surgery, do not take if having diarrhea (Patient not taking: Reported on 02/27/2019)   No facility-administered encounter medications on file as of 02/27/2019.    Allergy:  Allergies  Allergen Reactions  . Crestor [Rosuvastatin] Other (See Comments)    myalgia  . Lipitor [Atorvastatin] Other (See Comments)    myalgia  . Acetaminophen Itching and Other (See Comments)    "hyperactivity"  . Vytorin [Ezetimibe-Simvastatin] Other (See Comments)    myalgia    Social Hx:   Social History   Socioeconomic History  . Marital status: Married    Spouse name: Not on file  . Number of children: Not on file  . Years of education: Not on file  . Highest education level: Not on file  Occupational History  . Not on file  Tobacco Use  . Smoking status: Never Smoker  . Smokeless tobacco: Never Used  Substance and Sexual Activity  . Alcohol use: Yes    Comment: one to two beers a year  . Drug use: No  . Sexual activity: Not on file  Other Topics Concern  . Not on file  Social History Narrative  . Not on file   Social Determinants of Health   Financial Resource Strain:   . Difficulty of Paying Living Expenses: Not on file  Food Insecurity:   . Worried About Charity fundraiser in the Last Year: Not on  file  . Ran Out of Food in the Last Year: Not on file  Transportation Needs:   . Lack of Transportation (Medical): Not on file  . Lack of Transportation (Non-Medical): Not on file  Physical Activity:   . Days of Exercise per Week: Not on file  . Minutes of Exercise per Session: Not on file  Stress:   . Feeling of Stress : Not on file  Social Connections:   . Frequency of Communication with Friends and Family: Not on file  . Frequency of Social Gatherings with Friends and Family: Not on file  . Attends Religious Services: Not on file  . Active Member of Clubs or Organizations: Not on file  . Attends Archivist Meetings: Not on file  . Marital Status: Not on file  Intimate Partner Violence:   . Fear of Current or Ex-Partner: Not on file  . Emotionally Abused: Not on file  . Physically Abused: Not on file  . Sexually Abused: Not on file    Past Surgical Hx:  Past Surgical History:  Procedure Laterality Date  .  APPENDECTOMY    . BUNIONECTOMY    . CATARACT EXTRACTION    . CESAREAN SECTION    . CHOLECYSTECTOMY    . FOOT SURGERY Right 2011   bone graft   . HYSTEROSCOPY WITH D & C N/A 06/11/2018   Procedure: DILATATION AND CURETTAGE /HYSTEROSCOPY WITH MYOSURE POLYPECTOMY;  Surgeon: Lavonia Drafts, MD;  Location: [facility];  Service: Gynecology;  Laterality: N/A;  . NASAL SEPTUM SURGERY     at age 49  . ROBOTIC ASSISTED TOTAL HYSTERECTOMY WITH BILATERAL SALPINGO OOPHERECTOMY Bilateral 07/17/2018   Procedure: XI ROBOTIC ASSISTED TOTAL HYSTERECTOMY WITH BILATERAL SALPINGO OOPHORECTOMY AND LEFT PELVIC LYMPADECTOMY;  Surgeon: Everitt Amber, MD;  Location: [facility];  Service: Gynecology;  Laterality: Bilateral;  . SENTINEL NODE BIOPSY N/A 07/17/2018   Procedure: SENTINEL NODE BIOPSY;  Surgeon: Everitt Amber, MD;  Location: [facility];  Service: Gynecology;  Laterality: N/A;  . TONSILLECTOMY      Past Medical Hx:  Past Medical History:  Diagnosis Date  . Diverticulosis   . Elevated  blood pressure reading    165/71 a pre-op appt, reports she has seen her primary care in the last few months and intheir office it was in the 0000000 systolic, was not started on meds at that time, says her bp has "been creeping up since all this stuff started" , denies acute cardiac symptoms   . Hearing loss    wears hearing aids  . History of anemia as a child   . Hypercholesteremia    diet controlled  . Hyperlipidemia 02/16/2016  . Hypothyroid   . Hypothyroidism 02/16/2016    Past Gynecological History:  C/s x 1, menopause age 74. No LMP recorded (lmp unknown). Patient is postmenopausal.  Family Hx:  Family History  Problem Relation Age of Onset  . Hypertension Mother   . Heart attack Mother   . Heart attack Father   . Lung cancer Father   . Diabetes Sister   . Cervical cancer Sister   . Skin cancer Sister   . Diabetes Brother   . CAD Brother   . Lymphoma Brother   . Skin cancer Brother   . Diabetes Brother   . Skin cancer Brother   . Skin cancer Brother   . Prostate cancer Brother   . Diverticulitis Brother   . Skin cancer Brother   . Skin cancer Sister     Review of Systems:  Constitutional  Feels well,    ENT Normal appearing ears and nares bilaterally Skin/Breast  No rash, sores, jaundice, itching, dryness Cardiovascular  No chest pain, shortness of breath, or edema  Pulmonary  No cough or wheeze.  Gastro Intestinal  No nausea, vomitting, or diarrhoea. No bright red blood per rectum, no abdominal pain, change in bowel movement, or constipation.  Genito Urinary  No frequency, urgency, dysuria, no bleeding Musculo Skeletal  No myalgia, arthralgia, joint swelling or pain  Neurologic  No weakness, numbness, change in gait,  Psychology  No depression, anxiety, insomnia.   Vitals:  Blood pressure 128/64, pulse 73, temperature 98 F (36.7 C), temperature source Temporal, resp. rate 17, height 5' 2.5" (1.588 m), weight 207 lb 8 oz (94.1 kg), SpO2 98  %.  Physical Exam: WD in NAD Neck  Supple NROM, without any enlargements.  Lymph Node Survey No cervical supraclavicular or inguinal adenopathy Cardiovascular  Pulse normal rate, regularity and rhythm. S1 and S2 normal.  Lungs  Clear to auscultation bilateraly, without wheezes/crackles/rhonchi. Good air movement.  Skin  No rash/lesions/breakdown  Psychiatry  Alert and oriented to person, place, and time  Abdomen  Normoactive bowel sounds, abdomen soft, non-tender and obese without evidence of hernia. Soft  abdominal incisions.  Back No CVA tenderness Genito Urinary  Vaginal cuff smooth. Rectal  deferred Extremities  No bilateral cyanosis, clubbing or edema.  Thereasa Solo, MD  02/27/2019, 1:30 PM

## 2019-03-03 ENCOUNTER — Telehealth: Payer: Self-pay

## 2019-03-03 NOTE — Telephone Encounter (Signed)
Reviewed what a survivorship care plan is and how Elizabeth Maldonado can use it as a reference for follow up care needed and recommended for her endometrial cancer and general health care. Will mail the care plan to patient. Pt to call the office if she does not receive the care plan by ~03-12-19 so another plan can be sent to her or picked up at the office.`

## 2019-03-06 ENCOUNTER — Telehealth (INDEPENDENT_AMBULATORY_CARE_PROVIDER_SITE_OTHER): Payer: Medicare PPO | Admitting: Cardiology

## 2019-03-06 ENCOUNTER — Telehealth: Payer: Self-pay

## 2019-03-06 ENCOUNTER — Encounter: Payer: Self-pay | Admitting: Cardiology

## 2019-03-06 DIAGNOSIS — E7800 Pure hypercholesterolemia, unspecified: Secondary | ICD-10-CM | POA: Diagnosis not present

## 2019-03-06 DIAGNOSIS — Z8249 Family history of ischemic heart disease and other diseases of the circulatory system: Secondary | ICD-10-CM | POA: Diagnosis not present

## 2019-03-06 DIAGNOSIS — C541 Malignant neoplasm of endometrium: Secondary | ICD-10-CM

## 2019-03-06 NOTE — Progress Notes (Signed)
Virtual Visit via Video Note   This visit type was conducted due to national recommendations for restrictions regarding the COVID-19 Pandemic (e.g. social distancing) in an effort to limit this patient's exposure and mitigate transmission in our community.  Due to her co-morbid illnesses, this patient is at least at moderate risk for complications without adequate follow up.  This format is felt to be most appropriate for this patient at this time.  All issues noted in this document were discussed and addressed.  A limited physical exam was performed with this format.  Please refer to the patient's chart for her consent to telehealth for [facility].   Date:  03/06/2019   ID:  Elizabeth Maldonado, DOB 1945/05/08, MRN [id number]  Patient Location: Home Provider Location: Home  PCP:  Carol Ada, MD  Cardiologist:  Dr Oval Linsey Electrophysiologist:  None   Evaluation Performed:  Follow-Up Visit  Chief Complaint:  none  History of Present Illness:    Elizabeth Maldonado is a 74 y.o. female with a history of HLD and a Fm Hx of CAD.  She saw Dr Oval Linsey in 2018 for HLD and statin intolerance.  We tried to get her on a PCSK9 agent but she was turned down.  She was contacted today for routine follow up. Since we saw her last she tells me her insurance changed and her PCP was able to get her on Repatha. She has no current cardiac complaints.  The patient does not have symptoms concerning for COVID-19 infection (fever, chills, cough, or new shortness of breath).    Past Medical History:  Diagnosis Date  . Diverticulosis   . Elevated blood pressure reading    165/71 a pre-op appt, reports she has seen her primary care in the last few months and intheir office it was in the 0000000 systolic, was not started on meds at that time, says her bp has "been creeping up since all this stuff started" , denies acute cardiac symptoms   . Hearing loss    wears hearing aids  . History of anemia as a child   .  Hypercholesteremia    diet controlled  . Hyperlipidemia 02/16/2016  . Hypothyroid   . Hypothyroidism 02/16/2016   Past Surgical History:  Procedure Laterality Date  . APPENDECTOMY    . BUNIONECTOMY    . CATARACT EXTRACTION    . CESAREAN SECTION    . CHOLECYSTECTOMY    . FOOT SURGERY Right 2011   bone graft   . HYSTEROSCOPY WITH D & C N/A 06/11/2018   Procedure: DILATATION AND CURETTAGE /HYSTEROSCOPY WITH MYOSURE POLYPECTOMY;  Surgeon: Lavonia Drafts, MD;  Location: [facility];  Service: Gynecology;  Laterality: N/A;  . NASAL SEPTUM SURGERY     at age 36  . ROBOTIC ASSISTED TOTAL HYSTERECTOMY WITH BILATERAL SALPINGO OOPHERECTOMY Bilateral 07/17/2018   Procedure: XI ROBOTIC ASSISTED TOTAL HYSTERECTOMY WITH BILATERAL SALPINGO OOPHORECTOMY AND LEFT PELVIC LYMPADECTOMY;  Surgeon: Everitt Amber, MD;  Location[facility];  Service: Gynecology;  Laterality: Bilateral;  . SENTINEL NODE BIOPSY N/A 07/17/2018   Procedure: SENTINEL NODE BIOPSY;  Surgeon: Everitt Amber, MD;  Location: [facility];  Service: Gynecology;  Laterality: N/A;  . TONSILLECTOMY       No outpatient medications have been marked as taking for the 03/06/19 encounter (Appointment) with Erlene Quan, PA-C.     Allergies:   Crestor [rosuvastatin], Lipitor [atorvastatin], Acetaminophen, and Vytorin [ezetimibe-simvastatin]   Social History   Tobacco Use  . Smoking status: Never Smoker  .  Smokeless tobacco: Never Used  Substance Use Topics  . Alcohol use: Yes    Comment: one to two beers a year  . Drug use: No     Family Hx: The patient's family history includes CAD in her brother; Cervical cancer in her sister; Diabetes in her brother, brother, and sister; Diverticulitis in her brother; Heart attack in her father and mother; Hypertension in her mother; Lung cancer in her father; Lymphoma in her brother; Prostate cancer in her brother; Skin cancer in her brother, brother, brother, brother, sister, and sister.  ROS:   Please see the  history of present illness.    All other systems reviewed and are negative.   Prior CV studies:   The following studies were reviewed today:   Labs/Other Tests and Data Reviewed:    EKG:  No ECG reviewed.  Recent Labs: 07/04/2018: ALT 16; Hemoglobin 12.5; Platelets 198 08/30/2018: BUN 19; Creatinine 0.99; Potassium 5.2; Sodium 140   Recent Lipid Panel No results found for: CHOL, TRIG, HDL, CHOLHDL, LDLCALC, LDLDIRECT  Wt Readings from Last 3 Encounters:  02/27/19 207 lb 8 oz (94.1 kg)  08/09/18 209 lb 3.2 oz (94.9 kg)  07/17/18 206 lb (93.4 kg)     Objective:    Vital Signs:  LMP  (LMP Unknown)    VITAL SIGNS:  reviewed  ASSESSMENT & PLAN:    HLD - Statin intolerant- currently on Repatha followed by her PCP  Fm Hx of CAD- Father had an MI in his 69's, mother in her 58's, and her brother had CABG  Endometrial cancer- Surgery June 2020  Plan: PRN follow up  COVID-19 Education: The signs and symptoms of COVID-19 were discussed with the patient and how to seek care for testing (follow up with PCP or arrange E-visit).  The importance of social distancing was discussed today.  Time:   Today, I have spent 10 minutes with the patient with telehealth technology discussing the above problems.     Medication Adjustments/Labs and Tests Ordered: Current medicines are reviewed at length with the patient today.  Concerns regarding medicines are outlined above.   Tests Ordered: No orders of the defined types were placed in this encounter.   Medication Changes: No orders of the defined types were placed in this encounter.   Follow Up:  Either In Person or Virtual prn  Signed, Kerin Ransom, PA-C  03/06/2019 11:24 AM    [place]

## 2019-03-06 NOTE — Telephone Encounter (Signed)
1026AM S/W HUSBAND PT IS NOT AT HOME (Mrs [facility] is not home-we will have to call "on time" she is at the grocery store)Please CB at scheduled time for OV "she should be home by then"  10:15am I have tried to call Mrs [facility] x2 left messages no answer  Luke@ 10:11'ish: so I just did a video visit with Ms [facility]- she's a PRN f/u. She wants to give her new insurance info if you can call her  Called pt again x4 [id number] biller Pam she states that pt's insurance has already been verified, no need to keep calling.  No CB from pt at this time @11 :48am

## 2019-03-06 NOTE — Patient Instructions (Addendum)
Medication Instructions:  The current medical regimen is effective;  continue present plan and medications as directed. Please refer to the Current Medication list given to you today. If you need a refill on your cardiac medications before your next appointment, please call your pharmacy.  Follow-Up: AS NEEDED    You may see Skeet Latch, MD or one of the following Advanced Practice Providers on your designated Care Team:  Kerin Ransom, PA-C  [place], [place]  Coletta Memos, [place].    At [facility], you and your health needs are our priority.  As part of our continuing mission to provide you with exceptional heart care, we have created designated Provider Care Teams.  These Care Teams include your primary Cardiologist (physician) and Advanced Practice Providers (APPs -  Physician Assistants and Nurse Practitioners) who all work together to provide you with the care you need, when you need it.  Reduce your risk of getting COVID-19 With your heart disease it is especially important for people at increased risk of severe illness from COVID-19, and those who live with them, to protect themselves from getting COVID-19. The best way to protect yourself and to help reduce the spread of the virus that causes COVID-19 is to: Marland Kitchen Limit your interactions with other people as much as possible. . Take COVID-19 when you do interact with others. If you start feeling sick and think you may have COVID-19, get in touch with your healthcare provider within 24 hours.  Thank you for choosing [facility] at [facility]!!

## 2019-03-25 ENCOUNTER — Ambulatory Visit: Payer: Medicare PPO | Attending: Internal Medicine

## 2019-03-25 DIAGNOSIS — Z23 Encounter for immunization: Secondary | ICD-10-CM

## 2019-03-25 NOTE — Progress Notes (Signed)
   Covid-19 Vaccination Clinic  Name:  Mayda Houtman    MRN: [id number] DOB: 14-Dec-1945  03/25/2019  Ms. Desouza was observed post Covid-19 immunization for 15 minutes without incident. She was provided with Vaccine Information Sheet and instruction to access the V-Safe system.   Ms. Turbyfill was instructed to call 911 with any severe reactions post vaccine: Marland Kitchen Difficulty breathing  . Swelling of face and throat  . A fast heartbeat  . A bad rash all over body  . Dizziness and weakness   Immunizations Administered    Name Date Dose VIS Date Route   Pfizer COVID-19 Vaccine 03/25/2019 10:05 AM 0.3 mL 12/13/2018 Intramuscular   Manufacturer: [place]   Lot: [id number]   [place]: [id number]

## 2019-05-19 DIAGNOSIS — E785 Hyperlipidemia, unspecified: Secondary | ICD-10-CM | POA: Diagnosis not present

## 2019-08-29 ENCOUNTER — Other Ambulatory Visit: Payer: Self-pay

## 2019-08-29 ENCOUNTER — Inpatient Hospital Stay: Payer: Medicare PPO | Attending: Gynecologic Oncology | Admitting: Gynecologic Oncology

## 2019-08-29 ENCOUNTER — Encounter: Payer: Self-pay | Admitting: Gynecologic Oncology

## 2019-08-29 VITALS — BP 136/65 | HR 66 | Temp 97.5°F | Resp 18 | Ht 62.5 in | Wt 210.3 lb

## 2019-08-29 DIAGNOSIS — Z90722 Acquired absence of ovaries, bilateral: Secondary | ICD-10-CM | POA: Diagnosis not present

## 2019-08-29 DIAGNOSIS — E039 Hypothyroidism, unspecified: Secondary | ICD-10-CM | POA: Diagnosis not present

## 2019-08-29 DIAGNOSIS — E7800 Pure hypercholesterolemia, unspecified: Secondary | ICD-10-CM | POA: Diagnosis not present

## 2019-08-29 DIAGNOSIS — C541 Malignant neoplasm of endometrium: Secondary | ICD-10-CM | POA: Diagnosis not present

## 2019-08-29 DIAGNOSIS — Z90710 Acquired absence of both cervix and uterus: Secondary | ICD-10-CM | POA: Diagnosis not present

## 2019-08-29 DIAGNOSIS — I10 Essential (primary) hypertension: Secondary | ICD-10-CM | POA: Diagnosis not present

## 2019-08-29 NOTE — Progress Notes (Signed)
Follow-up Note: Gyn-Onc  Consult was initially  requested by Dr. Ihor Dow for the evaluation of Elizabeth Maldonado 74 y.o. female  CC:  Chief Complaint  Patient presents with   Endometrial cancer [facility])    Follow Up    Assessment/Plan:  Elizabeth Maldonado  is a 74 y.o.  year old with a history of stage IA grade 1 endometrioid endometrial cancer s/p robotic staging procedure on 07/16/18.  Pathology revealed low risk factors for recurrence, therefore no adjuvant therapy  recommended according to NCCN guidelines.  I discussed risk for recurrence and typical symptoms encouraged her to notify us of these should they develop between visits.  She was recommended to follow-up every 6 months for 5 years in accordance with NCCN guidelines, however, she declined visits at this frequency and enquired regarding spacing them to annual. I expressed that I felt that was reasonable so long as she was aware to contact my office should symptoms arise before then.  She continue to will follow-up for me for 6 monthly evaluations.   HPI: [name] is a 74 year old P1 who is seen in consultation at the request of Dr Ihor Dow for a grade 1 endometrial adenocarcinoma.   Patient reports postmenopausal bleeding on Mother's Day of 2020.  This prompted her to discuss it with her primary care physician, Dr. Tamala Julian, followed by a consultation with Dr. Ihor Dow.  Dr. Ihor Dow performed a transvaginal ultrasound scan on June 05, 2018.  This revealed a uterus measuring 6.5 x 4 x 4.6 cm.  The endometrium measured 24 mm and contained a markedly heterogeneous endometrial complex with multiple cystic areas.  The left and right ovaries were not visualized.  An office sampling was attempted but was unsuccessful due to discomfort.  The patient was taken to the operating room on June 11, 2018 where a D&C was performed.  This was accompanied by a hysteroscopy and MyoSure polypectomy.  Intraoperative findings were  significant for a large endometrial polyp.  Final pathology from this procedure revealed extensive complex atypical hyperplasia with foci suspicious for a well differentiated endometrioid adenocarcinoma.  The patient is obese with a BMI of 37.4 kg/m.  She is carries a diagnosis of hypertension, hypercholesterolemia, and hypothyroidism.  She has had 1 prior pregnancy with a cesarean delivery.  She has had a laparoscopic cholecystectomy and as a child an open appendectomy.  Menopause transitioned at age 26.  She reports that she was on hormone replacement therapy for about 5 years, she is unclear if this was unopposed estrogen hormone replacement therapy.  The patient's family history significant for brother with prostate cancer, a brother with lymphoma, and a father who had lung cancer but was a smoker.  The patient is a retired Associate Professor for high school.  She lives with her husband who is not of good underlying health.  On 07/16/18 she underwent robotic assisted total hysterectomy, BSO, SLN biopsy and left pelvic lymphadenectomy. Final pathology showed no residual carcinoma in the hysterectomy specimen.   She was assigned stage IA grade 1 endometrial cancer with low risk features. No adjuvant therapy was recommended based on final pathology and NCCN guidelines.   Interval Hx:  She has no symptoms concerning for recurrence and no complaints or toxicities from therapy  Current Meds:  Outpatient Encounter Medications as of 08/29/2019  Medication Sig   b complex vitamins tablet Take 1 tablet by mouth daily.   Calcium Citrate-Vitamin D (CALCIUM + D PO) Take 1 tablet by mouth daily.  Cholecalciferol (VITAMIN D) 125 MCG (5000 UT) CAPS Take 5,000 Units by mouth daily.   levothyroxine (SYNTHROID, LEVOTHROID) 75 MCG tablet Take 75 mcg by mouth daily.   Multiple Vitamin (MULTIVITAMIN WITH MINERALS) TABS tablet Take 1 tablet by mouth daily.   REPATHA SURECLICK 671 MG/ML SOAJ     Tetrahydrozoline HCl (VISINE OP) Place 1 drop into both eyes daily as needed (irritation).   vitamin C (ASCORBIC ACID) 500 MG tablet Take 500 mg by mouth daily.   No facility-administered encounter medications on file as of 08/29/2019.    Allergy:  Allergies  Allergen Reactions   Crestor [Rosuvastatin] Other (See Comments)    myalgia   Lipitor [Atorvastatin] Other (See Comments)    myalgia   Acetaminophen Itching and Other (See Comments)    "hyperactivity"   Vytorin [Ezetimibe-Simvastatin] Other (See Comments)    myalgia    Social Hx:   Social History   Socioeconomic History   Marital status: Married    Spouse name: Not on file   Number of children: Not on file   Years of education: Not on file   Highest education level: Not on file  Occupational History   Not on file  Tobacco Use   Smoking status: Never Smoker   Smokeless tobacco: Never Used  Vaping Use   Vaping Use: Never used  Substance and Sexual Activity   Alcohol use: Yes    Comment: one to two beers a year   Drug use: No   Sexual activity: Not on file  Other Topics Concern   Not on file  Social History Narrative   Not on file   Social Determinants of Health   Financial Resource Strain:    Difficulty of Paying Living Expenses: Not on file  Food Insecurity:    Worried About [place] in the Last Year: Not on file   Ran Out of Food in the Last Year: Not on file  Transportation Needs:    Lack of Transportation (Medical): Not on file   Lack of Transportation (Non-Medical): Not on file  Physical Activity:    Days of Exercise per Week: Not on file   Minutes of Exercise per Session: Not on file  Stress:    Feeling of Stress : Not on file  Social Connections:    Frequency of Communication with Friends and Family: Not on file   Frequency of Social Gatherings with Friends and Family: Not on file   Attends Religious Services: Not on file   Active Member of Clubs or  Organizations: Not on file   Attends Archivist Meetings: Not on file   Marital Status: Not on file  Intimate Partner Violence:    Fear of Current or Ex-Partner: Not on file   Emotionally Abused: Not on file   Physically Abused: Not on file   Sexually Abused: Not on file    Past Surgical Hx:  Past Surgical History:  Procedure Laterality Date   APPENDECTOMY     BUNIONECTOMY     CATARACT EXTRACTION     CESAREAN SECTION     CHOLECYSTECTOMY     FOOT SURGERY Right 2011   bone graft    HYSTEROSCOPY WITH D & C N/A 06/11/2018   Procedure: DILATATION AND CURETTAGE /HYSTEROSCOPY WITH MYOSURE POLYPECTOMY;  Surgeon: Lavonia Drafts, MD;  Location: [facility];  Service: Gynecology;  Laterality: N/A;   NASAL SEPTUM SURGERY     at age 66   ROBOTIC [place]  SALPINGO OOPHERECTOMY Bilateral 07/17/2018   Procedure: XI ROBOTIC ASSISTED TOTAL HYSTERECTOMY WITH BILATERAL SALPINGO OOPHORECTOMY AND LEFT PELVIC LYMPADECTOMY;  Surgeon: Everitt Amber, MD;  Location: [facility];  Service: Gynecology;  Laterality: Bilateral;   SENTINEL NODE BIOPSY N/A 07/17/2018   Procedure: SENTINEL NODE BIOPSY;  Surgeon: Everitt Amber, MD;  Location: [facility];  Service: Gynecology;  Laterality: N/A;   TONSILLECTOMY      Past Medical Hx:  Past Medical History:  Diagnosis Date   Diverticulosis    Elevated blood pressure reading    165/71 a pre-op appt, reports she has seen her primary care in the last few months and intheir office it was in the 258N systolic, was not started on meds at that time, says her bp has "been creeping up since all this stuff started" , denies acute cardiac symptoms    Hearing loss    wears hearing aids   History of anemia as a child    Hypercholesteremia    diet controlled   Hyperlipidemia 02/16/2016   Hypothyroid    Hypothyroidism 02/16/2016    Past Gynecological History:  C/s x 1, menopause age 51. No LMP recorded (lmp unknown).  Patient is postmenopausal.  Family Hx:  Family History  Problem Relation Age of Onset   Hypertension Mother    Heart attack Mother    Heart attack Father    Lung cancer Father    Diabetes Sister    Cervical cancer Sister    Skin cancer Sister    Diabetes Brother    CAD Brother    Lymphoma Brother    Skin cancer Brother    Diabetes Brother    Skin cancer Brother    Skin cancer Brother    Prostate cancer Brother    Diverticulitis Brother    Skin cancer Brother    Skin cancer Sister     Review of Systems:  Constitutional  Feels well,    ENT Normal appearing ears and nares bilaterally Skin/Breast  No rash, sores, jaundice, itching, dryness Cardiovascular  No chest pain, shortness of breath, or edema  Pulmonary  No cough or wheeze.  Gastro Intestinal  No nausea, vomitting, or diarrhoea. No bright red blood per rectum, no abdominal pain, change in bowel movement, or constipation.  Genito Urinary  No frequency, urgency, dysuria, no bleeding Musculo Skeletal  No myalgia, arthralgia, joint swelling or pain  Neurologic  No weakness, numbness, change in gait,  Psychology  No depression, anxiety, insomnia.   Vitals:  Blood pressure 136/65, pulse 66, temperature (!) 97.5 F (36.4 C), temperature source Tympanic, resp. rate 18, height 5' 2.5" (1.588 m), weight 210 lb 4.8 oz (95.4 kg), SpO2 98 %.  Physical Exam: WD in NAD Neck  Supple NROM, without any enlargements.  Lymph Node Survey No cervical supraclavicular or inguinal adenopathy Cardiovascular  Pulse normal rate, regularity and rhythm. S1 and S2 normal.  Lungs  Clear to auscultation bilateraly, without wheezes/crackles/rhonchi. Good air movement.  Skin  No rash/lesions/breakdown  Psychiatry  Alert and oriented to person, place, and time  Abdomen  Normoactive bowel sounds, abdomen soft, non-tender and obese without evidence of hernia. Soft  abdominal incisions.  Back No CVA  tenderness Genito Urinary  Vaginal cuff smooth. No palpable masses. No visible lesions. Tenderness on deep palpation at the vaginal cuff.  Rectal  deferred Extremities  No bilateral cyanosis, clubbing or edema.  Thereasa Solo, MD  08/29/2019, 4:15 PM

## 2019-08-29 NOTE — Patient Instructions (Signed)
Please notify Dr Denman George at phone number [phone] if you notice vaginal bleeding, new pelvic or abdominal pains, bloating, feeling full easy, or a change in bladder or bowel function.   Please contact Dr [name] office (at [phone]) in April, 2022 to request an appointment with her for August, 2022.

## 2019-10-18 DIAGNOSIS — Z20822 Contact with and (suspected) exposure to covid-19: Secondary | ICD-10-CM | POA: Diagnosis not present

## 2019-10-24 ENCOUNTER — Other Ambulatory Visit: Payer: Self-pay | Admitting: Unknown Physician Specialty

## 2019-10-24 ENCOUNTER — Emergency Department (HOSPITAL_BASED_OUTPATIENT_CLINIC_OR_DEPARTMENT_OTHER): Payer: Medicare PPO

## 2019-10-24 ENCOUNTER — Emergency Department (HOSPITAL_BASED_OUTPATIENT_CLINIC_OR_DEPARTMENT_OTHER)
Admission: EM | Admit: 2019-10-24 | Discharge: 2019-10-24 | Disposition: A | Discharge status: Home / Self Care | Payer: Medicare PPO | Attending: Emergency Medicine | Admitting: Emergency Medicine

## 2019-10-24 ENCOUNTER — Encounter (HOSPITAL_BASED_OUTPATIENT_CLINIC_OR_DEPARTMENT_OTHER): Payer: Self-pay | Admitting: Emergency Medicine

## 2019-10-24 ENCOUNTER — Ambulatory Visit (HOSPITAL_COMMUNITY)
Admission: RE | Admit: 2019-10-24 | Discharge: 2019-10-24 | Disposition: A | Discharge status: Home / Self Care | Payer: Medicare Other | Source: Ambulatory Visit | Attending: Pulmonary Disease | Admitting: Pulmonary Disease

## 2019-10-24 ENCOUNTER — Telehealth: Payer: Self-pay | Admitting: Unknown Physician Specialty

## 2019-10-24 ENCOUNTER — Other Ambulatory Visit: Payer: Self-pay

## 2019-10-24 DIAGNOSIS — E669 Obesity, unspecified: Secondary | ICD-10-CM | POA: Diagnosis present

## 2019-10-24 DIAGNOSIS — C541 Malignant neoplasm of endometrium: Secondary | ICD-10-CM | POA: Insufficient documentation

## 2019-10-24 DIAGNOSIS — E039 Hypothyroidism, unspecified: Secondary | ICD-10-CM | POA: Insufficient documentation

## 2019-10-24 DIAGNOSIS — R0602 Shortness of breath: Secondary | ICD-10-CM | POA: Diagnosis not present

## 2019-10-24 DIAGNOSIS — J9811 Atelectasis: Secondary | ICD-10-CM | POA: Diagnosis not present

## 2019-10-24 DIAGNOSIS — Z79899 Other long term (current) drug therapy: Secondary | ICD-10-CM | POA: Diagnosis not present

## 2019-10-24 DIAGNOSIS — U071 COVID-19: Secondary | ICD-10-CM | POA: Diagnosis not present

## 2019-10-24 DIAGNOSIS — Z23 Encounter for immunization: Secondary | ICD-10-CM | POA: Diagnosis not present

## 2019-10-24 LAB — COMPREHENSIVE METABOLIC PANEL
ALT: 17 U/L (ref 0–44)
AST: 21 U/L (ref 15–41)
Albumin: 4 g/dL (ref 3.5–5.0)
Alkaline Phosphatase: 69 U/L (ref 38–126)
Anion gap: 10 (ref 5–15)
BUN: 15 mg/dL (ref 8–23)
CO2: 27 mmol/L (ref 22–32)
Calcium: 8.6 mg/dL — ABNORMAL LOW (ref 8.9–10.3)
Chloride: 100 mmol/L (ref 98–111)
Creatinine, Ser: 1 mg/dL (ref 0.44–1.00)
GFR, Estimated: 60 mL/min — ABNORMAL LOW (ref >60)
Glucose, Bld: 114 mg/dL — ABNORMAL HIGH (ref 70–99)
Potassium: 3.8 mmol/L (ref 3.5–5.1)
Sodium: 137 mmol/L (ref 135–145)
Total Bilirubin: 1.1 mg/dL (ref 0.3–1.2)
Total Protein: 7.6 g/dL (ref 6.5–8.1)

## 2019-10-24 LAB — CBC WITH DIFFERENTIAL/PLATELET
Abs Immature Granulocytes: 0.02 10*3/uL (ref 0.00–0.07)
Basophils Absolute: 0 10*3/uL (ref 0.0–0.1)
Basophils Relative: 0 %
Eosinophils Absolute: 0.1 10*3/uL (ref 0.0–0.5)
Eosinophils Relative: 2 %
HCT: 40.7 % (ref 36.0–46.0)
Hemoglobin: 13.4 g/dL (ref 12.0–15.0)
Immature Granulocytes: 0 %
Lymphocytes Relative: 14 %
Lymphs Abs: 0.6 10*3/uL — ABNORMAL LOW (ref 0.7–4.0)
MCH: 31 pg (ref 26.0–34.0)
MCHC: 32.9 g/dL (ref 30.0–36.0)
MCV: 94.2 fL (ref 80.0–100.0)
Monocytes Absolute: 0.3 10*3/uL (ref 0.1–1.0)
Monocytes Relative: 8 %
Neutro Abs: 3.4 10*3/uL (ref 1.7–7.7)
Neutrophils Relative %: 76 %
Platelets: 200 10*3/uL (ref 150–400)
RBC: 4.32 MIL/uL (ref 3.87–5.11)
RDW: 13.2 % (ref 11.5–15.5)
WBC: 4.5 10*3/uL (ref 4.0–10.5)
nRBC: 0 % (ref 0.0–0.2)

## 2019-10-24 LAB — D-DIMER, QUANTITATIVE: D-Dimer, Quant: 1.22 ug/mL-FEU — ABNORMAL HIGH (ref 0.00–0.50)

## 2019-10-24 MED ORDER — ALBUTEROL SULFATE HFA 108 (90 BASE) MCG/ACT IN AERS: 2.0000 | INHALATION_SPRAY | Freq: Once | RESPIRATORY_TRACT | Status: DC | PRN

## 2019-10-24 MED ORDER — FAMOTIDINE IN NACL 20-0.9 MG/50ML-% IV SOLN: 20.0000 mg | Freq: Once | INTRAVENOUS | Status: DC | PRN

## 2019-10-24 MED ORDER — BENZONATATE 100 MG PO CAPS: 100.0000 mg | ORAL_CAPSULE | Freq: Three times a day (TID) | ORAL | 0 refills | Status: DC

## 2019-10-24 MED ORDER — SODIUM CHLORIDE 0.9 % IV SOLN: INTRAVENOUS | Status: DC | PRN

## 2019-10-24 MED ORDER — SODIUM CHLORIDE 0.9 % IV SOLN: Freq: Once | INTRAVENOUS | Status: AC

## 2019-10-24 MED ORDER — BENZONATATE 100 MG PO CAPS
100.0000 mg | ORAL_CAPSULE | Freq: Once | ORAL | Status: AC
  Administered 2019-10-24: 100 mg via ORAL
  Filled 2019-10-24: qty 1

## 2019-10-24 MED ORDER — DIPHENHYDRAMINE HCL 50 MG/ML IJ SOLN: 50.0000 mg | Freq: Once | INTRAMUSCULAR | Status: DC | PRN

## 2019-10-24 MED ORDER — LIDOCAINE VISCOUS HCL 2 % MT SOLN
15.0000 mL | Freq: Once | OROMUCOSAL | Status: AC
  Administered 2019-10-24: 15 mL via OROMUCOSAL
  Filled 2019-10-24: qty 15

## 2019-10-24 MED ORDER — IOHEXOL 350 MG/ML SOLN
100.0000 mL | Freq: Once | INTRAVENOUS | Status: AC | PRN
  Administered 2019-10-24: 100 mL via INTRAVENOUS

## 2019-10-24 MED ORDER — METHYLPREDNISOLONE SODIUM SUCC 125 MG IJ SOLR: 125.0000 mg | Freq: Once | INTRAMUSCULAR | Status: DC | PRN

## 2019-10-24 MED ORDER — EPINEPHRINE 0.3 MG/0.3ML IJ SOAJ: 0.3000 mg | Freq: Once | INTRAMUSCULAR | Status: DC | PRN

## 2019-10-24 NOTE — Progress Notes (Signed)
  Diagnosis: COVID-19  Physician: Dr Joya Gaskins  Procedure: [facility] Infusion [facility] Med: bamlanivimab\etesevimab infusion - Provided patient with bamlanimivab\etesevimab fact sheet for patients, parents and caregivers prior to infusion.  Complications: No immediate complications noted.  Discharge: Discharged home   Heide Scales 10/24/2019

## 2019-10-24 NOTE — Discharge Instructions (Signed)

## 2019-10-24 NOTE — ED Notes (Signed)
Assisted to [facility], SOB with exertion, dry cough

## 2019-10-24 NOTE — ED Triage Notes (Signed)
Started with sore throat a week ago and went to [facility] and had a covid test and it was positive sent here for further  Tests, sob with exertion and talking

## 2019-10-24 NOTE — ED Provider Notes (Signed)
[facility] EMERGENCY DEPARTMENT Provider Note   CSN: [id number] Arrival date & time: 10/24/19  1751     History Chief Complaint  Patient presents with  . Shortness of Breath  . Covid Positive    Elizabeth Maldonado is a 74 y.o. female.  The history is provided by the patient and medical records.  Shortness of Breath  Elizabeth Maldonado is a 74 y.o. female who presents to the Emergency Department complaining of shortness of breath. She presents to the emergency department complaining of sore throat, cough and shortness of breath that started on Saturday, October 16. Symptoms started after her husband had been sick for about one week. She has associated nasal congestion, headaches, body aches. No reports of fevers. She feels short of breath due to the coughing. She had a COVID test on Saturday, October 16 which was negative. [facility] and the test was positive. She has shortness of breath with activities. No abdominal pain, nausea. She has mild diarrhea. She has a history of hypothyroidism, no additional significant medical problems. She has been vaccinated for COVID-19 earlier this year.    Past Medical History:  Diagnosis Date  . Diverticulosis   . Elevated blood pressure reading    165/71 a pre-op appt, reports she has seen her primary care in the last few months and intheir office it was in the 025E systolic, was not started on meds at that time, says her bp has "been creeping up since all this stuff started" , denies acute cardiac symptoms   . Hearing loss    wears hearing aids  . History of anemia as a child   . Hypercholesteremia    diet controlled  . Hyperlipidemia 02/16/2016  . Hypothyroid   . Hypothyroidism 02/16/2016    Patient Active Problem List   Diagnosis Date Noted  . Family history of coronary artery disease 03/06/2019  . Endometrial cancer ([place]) 07/11/2018  . Obesity (BMI 35.0-39.9 without comorbidity) 07/11/2018  .  Endometrial polyp 06/11/2018  . Post-menopausal bleeding 06/06/2018  . Hyperlipidemia 02/16/2016  . Hypothyroidism 02/16/2016  . Plantar fasciitis of right foot 12/16/2015  . Metatarsal deformity, right 12/16/2015  . Pronation deformity of ankle, acquired 12/16/2015    Past Surgical History:  Procedure Laterality Date  . APPENDECTOMY    . BUNIONECTOMY    . CATARACT EXTRACTION    . CESAREAN SECTION    . CHOLECYSTECTOMY    . FOOT SURGERY Right 2011   bone graft   . HYSTEROSCOPY WITH D & C N/A 06/11/2018   Procedure: DILATATION AND CURETTAGE /HYSTEROSCOPY WITH MYOSURE POLYPECTOMY;  Surgeon: Lavonia Drafts, MD;  Location: [facility];  Service: Gynecology;  Laterality: N/A;  . NASAL SEPTUM SURGERY     at age 40  . ROBOTIC ASSISTED TOTAL HYSTERECTOMY WITH BILATERAL SALPINGO OOPHERECTOMY Bilateral 07/17/2018   Procedure: XI ROBOTIC ASSISTED TOTAL HYSTERECTOMY WITH BILATERAL SALPINGO OOPHORECTOMY AND LEFT PELVIC LYMPADECTOMY;  Surgeon: Everitt Amber, MD;  [facility];  Service: Gynecology;  Laterality: Bilateral;  . SENTINEL NODE BIOPSY N/A 07/17/2018   Procedure: SENTINEL NODE BIOPSY;  Surgeon: Everitt Amber, MD;  [facility];  Service: Gynecology;  Laterality: N/A;  . TONSILLECTOMY       OB History    Gravida  1   Para  1   Term  1   Preterm      AB      Living  1     SAB  TAB      Ectopic      Multiple      Live Births  1           Family History  Problem Relation Age of Onset  . Hypertension Mother   . Heart attack Mother   . Heart attack Father   . Lung cancer Father   . Diabetes Sister   . Cervical cancer Sister   . Skin cancer Sister   . Diabetes Brother   . CAD Brother   . Lymphoma Brother   . Skin cancer Brother   . Diabetes Brother   . Skin cancer Brother   . Skin cancer Brother   . Prostate cancer Brother   . Diverticulitis Brother   . Skin cancer Brother   . Skin cancer Sister     Social History   Tobacco Use  . Smoking  status: Never Smoker  . Smokeless tobacco: Never Used  Vaping Use  . Vaping Use: Never used  Substance Use Topics  . Alcohol use: Yes    Comment: one to two beers a year  . Drug use: No    Home Medications Prior to Admission medications   Medication Sig Start Date End Date Taking? Authorizing Provider  b complex vitamins tablet Take 1 tablet by mouth daily.    [provider]  benzonatate (TESSALON) 100 MG capsule Take 1 capsule (100 mg total) by mouth every 8 (eight) hours. 10/24/19   Quintella Reichert, MD  Calcium Citrate-Vitamin D (CALCIUM + D PO) Take 1 tablet by mouth daily.    [provider]  Cholecalciferol (VITAMIN D) 125 MCG (5000 UT) CAPS Take 5,000 Units by mouth daily.    [provider]  levothyroxine (SYNTHROID, LEVOTHROID) 75 MCG tablet Take 75 mcg by mouth daily.    [provider]  Multiple Vitamin (MULTIVITAMIN WITH MINERALS) TABS tablet Take 1 tablet by mouth daily.    [provider]  REPATHA SURECLICK 283 MG/ML SOAJ  02/14/19   [provider]  Tetrahydrozoline HCl (VISINE OP) Place 1 drop into both eyes daily as needed (irritation).    [provider]  vitamin C (ASCORBIC ACID) 500 MG tablet Take 500 mg by mouth daily.    [provider]    Allergies    Crestor [rosuvastatin], Lipitor [atorvastatin], Acetaminophen, and Vytorin [ezetimibe-simvastatin]  Review of Systems   Review of Systems  Respiratory: Positive for shortness of breath.   All other systems reviewed and are negative.   Physical Exam Updated Vital Signs BP 115/82   Pulse 83   Temp 98.9 F (37.2 C) (Oral)   Resp 17   Ht 5\' 2"  (1.575 m)   Wt 90.7 kg   LMP  (LMP Unknown)   SpO2 96%   BMI 36.58 kg/m   Physical Exam Vitals and nursing note reviewed.  Constitutional:      Appearance: She is well-developed.  HENT:     Head: Normocephalic and atraumatic.     Mouth/Throat:     Mouth: Mucous membranes are moist.      Pharynx: No oropharyngeal exudate or posterior oropharyngeal erythema.  Cardiovascular:     Rate and Rhythm: Normal rate and regular rhythm.     Heart sounds: No murmur heard.   Pulmonary:     Effort: Pulmonary effort is normal. No respiratory distress.     Breath sounds: Normal breath sounds.     Comments: Transmitted upper airway noises. Abdominal:  Palpations: Abdomen is soft.     Tenderness: There is no abdominal tenderness. There is no guarding or rebound.  Musculoskeletal:        General: No swelling or tenderness.  Skin:    General: Skin is warm and dry.  Neurological:     Mental Status: She is alert and oriented to person, place, and time.  Psychiatric:        Behavior: Behavior normal.     ED Results / Procedures / Treatments   Labs (all labs ordered are listed, but only abnormal results are displayed) Labs Reviewed  COMPREHENSIVE METABOLIC PANEL - Abnormal; Notable for the following components:      Result Value   Glucose, Bld 114 (*)    Calcium 8.6 (*)    GFR, Estimated 60 (*)    All other components within normal limits  CBC WITH DIFFERENTIAL/PLATELET - Abnormal; Notable for the following components:   Lymphs Abs 0.6 (*)    All other components within normal limits  D-DIMER, QUANTITATIVE (NOT AT [facility]) - Abnormal; Notable for the following components:   D-Dimer, Quant 1.22 (*)    All other components within normal limits    EKG EKG Interpretation  Date/Time:  Friday October 24 2019 11:00:34 EDT Ventricular Rate:  84 PR Interval:    QRS Duration: 99 QT Interval:  341 QTC Calculation: 403 R Axis:   59 Text Interpretation: Sinus rhythm Low voltage, precordial leads Confirmed by Quintella Reichert [phone]) on 10/24/2019 12:03:30 PM   Radiology CT Angio Chest PE W/Cm &/Or Wo Cm  Result Date: 10/24/2019 CLINICAL DATA:  Shortness of breath, elevated D-dimer, COVID-19 positive diagnosed today EXAM: CT ANGIOGRAPHY CHEST WITH CONTRAST TECHNIQUE: Multidetector  CT imaging of the chest was performed using the standard protocol during bolus administration of intravenous contrast. Multiplanar CT image reconstructions and MIPs were obtained to evaluate the vascular anatomy. CONTRAST:  128mL OMNIPAQUE IOHEXOL 350 MG/ML SOLN IV COMPARISON:  CT chest 12/11/2011 FINDINGS: Cardiovascular: Minimal atherosclerotic calcification aorta proximal great vessels. Aorta normal caliber. Heart unremarkable. No pericardial effusion. Pulmonary arteries adequately opacified and patent. No evidence of pulmonary embolism. Mediastinum/Nodes: Esophagus unremarkable. Base of cervical region normal appearance. No thoracic adenopathy. Lungs/Pleura: Azygos fissure noted. Minimal bibasilar atelectasis. No pulmonary infiltrate, pleural effusion or pneumothorax. Upper Abdomen: Gallbladder surgically absent. Visualized upper abdomen otherwise unremarkable. Musculoskeletal: Small bone island at distal sternum. No acute osseous findings. Review of the MIP images confirms the above findings. IMPRESSION: No evidence of pulmonary embolism. Minimal bibasilar atelectasis. No acute intrathoracic abnormalities otherwise seen. Aortic Atherosclerosis (ICD10-I70.0). Electronically Signed   By: Lavonia Dana M.D.   On: 10/24/2019 12:54   DG Chest Port 1 View  Result Date: 10/24/2019 CLINICAL DATA:  COVID positive with shortness of breath. EXAM: PORTABLE CHEST 1 VIEW COMPARISON:  01/31/2017 FINDINGS: The heart size and mediastinal contours are within normal limits. Both lungs are clear. No visible pleural effusions or pneumothorax. The visualized skeletal structures are unremarkable. IMPRESSION: No acute cardiopulmonary disease. Electronically Signed   By: Margaretha Sheffield MD   On: 10/24/2019 10:41    Procedures Procedures (including critical care time)  Medications Ordered in ED Medications  benzonatate (TESSALON) capsule 100 mg (100 mg Oral Given 10/24/19 1043)  lidocaine (XYLOCAINE) 2 % viscous mouth  solution 15 mL (15 mLs Mouth/Throat Given 10/24/19 1044)  iohexol (OMNIPAQUE) 350 MG/ML injection 100 mL (100 mLs Intravenous Contrast Given 10/24/19 1233)    ED Course  I have reviewed the triage vital signs and  the nursing notes.  Pertinent labs & imaging results that were available during my care of the patient were reviewed by me and considered in my medical decision making (see chart for details).    MDM Rules/Calculators/A&P                         Patient referred to the emergency department for COVID-19 infection that was diagnosed on outpatient testing earlier today. She reports shortness of breath, sore throat and frequent coughing. She is non-toxic on evaluation but does have frequent coughing. No respiratory distress.  She was treated with viscous lidocaine, test line with improvement in her symptoms. Chest x-ray without any significant infiltrate. D dimer was obtained, which was elevated and a CTA was obtained, which is negative for PE or pneumonia. Discussed with patient home care for COVID-19 infection. Discussed outpatient follow-up and return precautions.  Elizabeth Maldonado was evaluated in Emergency Department on 10/24/2019 for the symptoms described in the history of present illness. She was evaluated in the context of the global COVID-19 pandemic, which necessitated consideration that the patient might be at risk for infection with the SARS-CoV-2 virus that causes COVID-19. Institutional protocols and algorithms that pertain to the evaluation of patients at risk for COVID-19 are in a state of rapid change based on information released by regulatory bodies including the CDC and federal and state organizations. These policies and algorithms were followed during the patient's care in the ED.   Final Clinical Impression(s) / ED Diagnoses Final diagnoses:  COVID-19 virus infection    Rx / DC Orders ED Discharge Orders         Ordered    benzonatate (TESSALON) 100 MG capsule  Every 8  hours        10/24/19 1316           Quintella Reichert, MD 10/24/19 1320

## 2019-10-24 NOTE — Telephone Encounter (Signed)
I connected by phone with Elizabeth Maldonado on 10/24/2019 at 1:46 PM to discuss the potential use of a new treatment for mild to moderate COVID-19 viral infection in non-hospitalized patients.  This patient is a 74 y.o. female that meets the FDA criteria for Emergency Use Authorization of COVID monoclonal antibody casirivimab/imdevimab or bamlanivimab/eteseviamb.  Has a (+) direct SARS-CoV-2 viral test result  Has mild or moderate COVID-19   Is NOT hospitalized due to COVID-19  Is within 10 days of symptom onset  Has at least one of the high risk factor(s) for progression to severe COVID-19 and/or hospitalization as defined in EUA.  Specific high risk criteria : Older age (>/= 72 yo)   I have spoken and communicated the following to the patient or parent/caregiver regarding COVID monoclonal antibody treatment:  1. FDA has authorized the emergency use for the treatment of mild to moderate COVID-19 in adults and pediatric patients with positive results of direct SARS-CoV-2 viral testing who are 12 years of age and older weighing at least 40 kg, and who are at high risk for progressing to severe COVID-19 and/or hospitalization.  2. The significant known and potential risks and benefits of COVID monoclonal antibody, and the extent to which such potential risks and benefits are unknown.  3. Information on available alternative treatments and the risks and benefits of those alternatives, including clinical trials.  4. Patients treated with COVID monoclonal antibody should continue to self-isolate and use infection control measures (e.g., wear mask, isolate, social distance, avoid sharing personal items, clean and disinfect "high touch" surfaces, and frequent handwashing) according to CDC guidelines.   5. The patient or parent/caregiver has the option to accept or refuse COVID monoclonal antibody treatment.  After reviewing this information with the patient, the patient has agreed to receive one of  the available covid 19 monoclonal antibodies and will be provided an appropriate fact sheet prior to infusion. Kathrine Haddock, NP 10/24/2019 1:46 PM  Sx onset 10/18

## 2019-10-24 NOTE — ED Notes (Signed)
SL wrapped with coban, d/c'ed to infusion center for appointment at 1530, per ED MD

## 2019-10-24 NOTE — ED Notes (Signed)
Husband contacted for discharge

## 2019-12-04 DIAGNOSIS — E785 Hyperlipidemia, unspecified: Secondary | ICD-10-CM | POA: Diagnosis not present

## 2019-12-04 DIAGNOSIS — E039 Hypothyroidism, unspecified: Secondary | ICD-10-CM | POA: Diagnosis not present

## 2019-12-04 DIAGNOSIS — R7309 Other abnormal glucose: Secondary | ICD-10-CM | POA: Diagnosis not present

## 2019-12-04 DIAGNOSIS — L603 Nail dystrophy: Secondary | ICD-10-CM | POA: Diagnosis not present

## 2019-12-04 DIAGNOSIS — R413 Other amnesia: Secondary | ICD-10-CM | POA: Diagnosis not present

## 2019-12-04 DIAGNOSIS — R251 Tremor, unspecified: Secondary | ICD-10-CM | POA: Diagnosis not present

## 2019-12-04 DIAGNOSIS — R198 Other specified symptoms and signs involving the digestive system and abdomen: Secondary | ICD-10-CM | POA: Diagnosis not present

## 2020-01-06 DIAGNOSIS — Z1231 Encounter for screening mammogram for malignant neoplasm of breast: Secondary | ICD-10-CM | POA: Diagnosis not present

## 2020-01-13 DIAGNOSIS — N6342 Unspecified lump in left breast, subareolar: Secondary | ICD-10-CM | POA: Diagnosis not present

## 2020-01-13 DIAGNOSIS — R922 Inconclusive mammogram: Secondary | ICD-10-CM | POA: Diagnosis not present

## 2020-01-13 DIAGNOSIS — R928 Other abnormal and inconclusive findings on diagnostic imaging of breast: Secondary | ICD-10-CM | POA: Diagnosis not present

## 2020-01-29 DIAGNOSIS — Z1389 Encounter for screening for other disorder: Secondary | ICD-10-CM | POA: Diagnosis not present

## 2020-01-29 DIAGNOSIS — E785 Hyperlipidemia, unspecified: Secondary | ICD-10-CM | POA: Diagnosis not present

## 2020-01-29 DIAGNOSIS — R7309 Other abnormal glucose: Secondary | ICD-10-CM | POA: Diagnosis not present

## 2020-01-29 DIAGNOSIS — E039 Hypothyroidism, unspecified: Secondary | ICD-10-CM | POA: Diagnosis not present

## 2020-01-29 DIAGNOSIS — I700 Atherosclerosis of aorta: Secondary | ICD-10-CM | POA: Diagnosis not present

## 2020-01-29 DIAGNOSIS — Z1159 Encounter for screening for other viral diseases: Secondary | ICD-10-CM | POA: Diagnosis not present

## 2020-01-29 DIAGNOSIS — F418 Other specified anxiety disorders: Secondary | ICD-10-CM | POA: Diagnosis not present

## 2020-01-29 DIAGNOSIS — Z0000 Encounter for general adult medical examination without abnormal findings: Secondary | ICD-10-CM | POA: Diagnosis not present

## 2020-02-12 DIAGNOSIS — C50919 Malignant neoplasm of unspecified site of unspecified female breast: Secondary | ICD-10-CM

## 2020-02-12 DIAGNOSIS — D0512 Intraductal carcinoma in situ of left breast: Secondary | ICD-10-CM | POA: Diagnosis not present

## 2020-02-12 DIAGNOSIS — Z170 Estrogen receptor positive status [ER+]: Secondary | ICD-10-CM | POA: Diagnosis not present

## 2020-02-12 DIAGNOSIS — N6325 Unspecified lump in the left breast, overlapping quadrants: Secondary | ICD-10-CM | POA: Diagnosis not present

## 2020-02-12 DIAGNOSIS — D242 Benign neoplasm of left breast: Secondary | ICD-10-CM | POA: Diagnosis not present

## 2020-02-12 DIAGNOSIS — R921 Mammographic calcification found on diagnostic imaging of breast: Secondary | ICD-10-CM | POA: Diagnosis not present

## 2020-02-12 DIAGNOSIS — C50412 Malignant neoplasm of upper-outer quadrant of left female breast: Secondary | ICD-10-CM | POA: Diagnosis not present

## 2020-02-12 DIAGNOSIS — C50812 Malignant neoplasm of overlapping sites of left female breast: Secondary | ICD-10-CM | POA: Diagnosis not present

## 2020-02-12 DIAGNOSIS — C50912 Malignant neoplasm of unspecified site of left female breast: Secondary | ICD-10-CM | POA: Diagnosis not present

## 2020-02-12 HISTORY — DX: Malignant neoplasm of unspecified site of unspecified female breast: C50.919

## 2020-02-18 DIAGNOSIS — H43813 Vitreous degeneration, bilateral: Secondary | ICD-10-CM | POA: Diagnosis not present

## 2020-02-18 DIAGNOSIS — H0100A Unspecified blepharitis right eye, upper and lower eyelids: Secondary | ICD-10-CM | POA: Diagnosis not present

## 2020-02-18 DIAGNOSIS — H0100B Unspecified blepharitis left eye, upper and lower eyelids: Secondary | ICD-10-CM | POA: Diagnosis not present

## 2020-02-18 DIAGNOSIS — H26492 Other secondary cataract, left eye: Secondary | ICD-10-CM | POA: Diagnosis not present

## 2020-03-01 ENCOUNTER — Other Ambulatory Visit: Payer: Self-pay | Admitting: Surgery

## 2020-03-01 DIAGNOSIS — Z853 Personal history of malignant neoplasm of breast: Secondary | ICD-10-CM

## 2020-03-01 DIAGNOSIS — D242 Benign neoplasm of left breast: Secondary | ICD-10-CM | POA: Diagnosis not present

## 2020-03-01 DIAGNOSIS — C50912 Malignant neoplasm of unspecified site of left female breast: Secondary | ICD-10-CM | POA: Diagnosis not present

## 2020-03-08 ENCOUNTER — Other Ambulatory Visit: Payer: Self-pay | Admitting: *Deleted

## 2020-03-08 NOTE — Progress Notes (Signed)
New Breast Cancer Diagnosis: Left Breast   Did patient present with symptoms (if so, please note symptoms) or screening mammography?:Screening Mass    Location and Extent of disease : She was found to have 2 areas within the breast measuring up to 1.7 cm in greatest dimension.  An additional papilloma was also noted in the 3 o'clock position.  Histology per Pathology Report: histological grade 2, intermediate grade, Invasive Ductal Carcinoma Biopsy done at [facility] 02/12/2020  Receptor Status: ER(positive), PR (positive), Her2-neu (negative), Ki-(5%)  Surgeon and surgical plan, if any: Dr. Blackman -Left Breast Lumpectomy x2 with radioactive seed localization 03/25/2020  Medical oncologist, treatment if any:     Family History of Breast/Ovarian/Prostate Cancer:  Personal history of Stage Ia endometrial adenocarcinoma.  Maternal Aunt with Breast cancer.  Brother had Prostate cancer.  Lymphedema issues, if any:  No  Pain issues, if any:  None at this time.  SAFETY ISSUES: Prior radiation? No Pacemaker/ICD? No Possible current pregnancy? Hysterectomy 07/2018 Is the patient on methotrexate? No  Current Complaints / other details:    

## 2020-03-09 ENCOUNTER — Ambulatory Visit
Admission: RE | Admit: 2020-03-09 | Discharge: 2020-03-09 | Disposition: A | Discharge status: Home / Self Care | Payer: Medicare PPO | Source: Ambulatory Visit | Attending: Radiation Oncology | Admitting: Radiation Oncology

## 2020-03-09 ENCOUNTER — Encounter: Payer: Self-pay | Admitting: Radiation Oncology

## 2020-03-09 ENCOUNTER — Other Ambulatory Visit: Payer: Self-pay

## 2020-03-09 DIAGNOSIS — Z808 Family history of malignant neoplasm of other organs or systems: Secondary | ICD-10-CM | POA: Diagnosis not present

## 2020-03-09 DIAGNOSIS — E039 Hypothyroidism, unspecified: Secondary | ICD-10-CM | POA: Insufficient documentation

## 2020-03-09 DIAGNOSIS — Z170 Estrogen receptor positive status [ER+]: Secondary | ICD-10-CM

## 2020-03-09 DIAGNOSIS — E785 Hyperlipidemia, unspecified: Secondary | ICD-10-CM | POA: Insufficient documentation

## 2020-03-09 DIAGNOSIS — Z801 Family history of malignant neoplasm of trachea, bronchus and lung: Secondary | ICD-10-CM | POA: Diagnosis not present

## 2020-03-09 DIAGNOSIS — C50412 Malignant neoplasm of upper-outer quadrant of left female breast: Secondary | ICD-10-CM | POA: Insufficient documentation

## 2020-03-09 DIAGNOSIS — C50812 Malignant neoplasm of overlapping sites of left female breast: Secondary | ICD-10-CM | POA: Insufficient documentation

## 2020-03-09 DIAGNOSIS — E7800 Pure hypercholesterolemia, unspecified: Secondary | ICD-10-CM | POA: Diagnosis not present

## 2020-03-09 DIAGNOSIS — Z8542 Personal history of malignant neoplasm of other parts of uterus: Secondary | ICD-10-CM | POA: Diagnosis not present

## 2020-03-09 DIAGNOSIS — C50912 Malignant neoplasm of unspecified site of left female breast: Secondary | ICD-10-CM | POA: Insufficient documentation

## 2020-03-09 DIAGNOSIS — Z90710 Acquired absence of both cervix and uterus: Secondary | ICD-10-CM | POA: Insufficient documentation

## 2020-03-09 NOTE — Progress Notes (Signed)
Radiation Oncology         [phone] ________________________________  Name: Elizabeth Maldonado        MRN: [id number]  Date of Service: 03/09/2020 DOB: 1945/11/11  LP:[id number], Hal Hope, MD  Coralie Keens, MD     REFERRING PHYSICIAN: Coralie Keens, MD   DIAGNOSIS: There were no encounter diagnoses.   HISTORY OF PRESENT ILLNESS: Elizabeth Maldonado is a 75 y.o. female seen at the request of Dr. Ninfa Linden for a new diagnosis of left breast cancer.  The patient has a history of Stage IA endometrial adenocarcinoma for which she underwent robotic hysterectomy and staging in July 2020.  She has been followed without recurrence.  She recently was found to have 2 areas within the breast measuring up to 1.7 cm.  An additional papilloma was also noted in the 3 o'clock position was biopsied and was consistent with an invasive ductal carcinoma, grade 2.  It appears from Dr. Trevor Mace note that her cancer was ER/PR positive HER-2 negative with a Ki-67 of 5%. She's seen today to discuss adjuvant treatment.   PREVIOUS RADIATION THERAPY: No   PAST MEDICAL HISTORY:  Past Medical History:  Diagnosis Date  . Diverticulosis   . Elevated blood pressure reading    165/71 a pre-op appt, reports she has seen her primary care in the last few months and intheir office it was in the 110Y systolic, was not started on meds at that time, says her bp has "been creeping up since all this stuff started" , denies acute cardiac symptoms   . Hearing loss    wears hearing aids  . History of anemia as a child   . Hypercholesteremia    diet controlled  . Hyperlipidemia 02/16/2016  . Hypothyroid   . Hypothyroidism 02/16/2016       PAST SURGICAL HISTORY: Past Surgical History:  Procedure Laterality Date  . APPENDECTOMY    . BUNIONECTOMY    . CATARACT EXTRACTION    . CESAREAN SECTION    . CHOLECYSTECTOMY    . FOOT SURGERY Right 2011   bone graft   . HYSTEROSCOPY WITH D & C N/A 06/11/2018   Procedure: DILATATION AND  CURETTAGE /HYSTEROSCOPY WITH MYOSURE POLYPECTOMY;  Surgeon: Lavonia Drafts, MD;  Location: [facility];  Service: Gynecology;  Laterality: N/A;  . NASAL SEPTUM SURGERY     at age 54  . ROBOTIC ASSISTED TOTAL HYSTERECTOMY WITH BILATERAL SALPINGO OOPHERECTOMY Bilateral 07/17/2018   Procedure: XI ROBOTIC ASSISTED TOTAL HYSTERECTOMY WITH BILATERAL SALPINGO OOPHORECTOMY AND LEFT PELVIC LYMPADECTOMY;  Surgeon: Everitt Amber, MD;  Location: [facility];  Service: Gynecology;  Laterality: Bilateral;  . SENTINEL NODE BIOPSY N/A 07/17/2018   Procedure: SENTINEL NODE BIOPSY;  Surgeon: Everitt Amber, MD;  Location: [facility];  Service: Gynecology;  Laterality: N/A;  . TONSILLECTOMY       FAMILY HISTORY:  Family History  Problem Relation Age of Onset  . Hypertension Mother   . Heart attack Mother   . Heart attack Father   . Lung cancer Father   . Diabetes Sister   . Cervical cancer Sister   . Skin cancer Sister   . Diabetes Brother   . CAD Brother   . Lymphoma Brother   . Skin cancer Brother   . Diabetes Brother   . Skin cancer Brother   . Skin cancer Brother   . Prostate cancer Brother   . Diverticulitis Brother   . Skin cancer Brother   . Skin cancer Sister  SOCIAL HISTORY:  reports that she has never smoked. She has never used smokeless tobacco. She reports current alcohol use. She reports that she does not use drugs. The patient is married and lives in [place]. She is active in attending her church, [employer] in [place]. She and her husband live on a farm and are very active in maintaining their property.   ALLERGIES: Crestor [rosuvastatin], Lipitor [atorvastatin], Acetaminophen, and Vytorin [ezetimibe-simvastatin]   MEDICATIONS:  Current Outpatient Medications  Medication Sig Dispense Refill  . b complex vitamins tablet Take 1 tablet by mouth daily.    . benzonatate (TESSALON) 100 MG capsule Take 1 capsule (100 mg total) by mouth every 8 (eight) hours. 21 capsule 0   . Calcium Citrate-Vitamin D (CALCIUM + D PO) Take 1 tablet by mouth daily.    . Cholecalciferol (VITAMIN D) 125 MCG (5000 UT) CAPS Take 5,000 Units by mouth daily.    Marland Kitchen levothyroxine (SYNTHROID, LEVOTHROID) 75 MCG tablet Take 75 mcg by mouth daily.    . Multiple Vitamin (MULTIVITAMIN WITH MINERALS) TABS tablet Take 1 tablet by mouth daily.    Marland Kitchen REPATHA SURECLICK 916 MG/ML SOAJ     . Tetrahydrozoline HCl (VISINE OP) Place 1 drop into both eyes daily as needed (irritation).    . vitamin C (ASCORBIC ACID) 500 MG tablet Take 500 mg by mouth daily.     No current facility-administered medications for this encounter.     REVIEW OF SYSTEMS: On review of systems, the patient reports that she is doing well overall. She is doing well overall since biopsy. She is not having any pain in the breast since biopsy.    PHYSICAL EXAM:  Wt Readings from Last 3 Encounters:  10/24/19 200 lb (90.7 kg)  08/29/19 210 lb 4.8 oz (95.4 kg)  02/27/19 207 lb 8 oz (94.1 kg)   Temp Readings from Last 3 Encounters:  10/24/19 98.6 F (37 C) (Oral)  10/24/19 98.9 F (37.2 C) (Oral)  08/29/19 (!) 97.5 F (36.4 C) (Tympanic)   BP Readings from Last 3 Encounters:  10/24/19 139/63  10/24/19 115/82  08/29/19 136/65   Pulse Readings from Last 3 Encounters:  10/24/19 84  10/24/19 83  08/29/19 66    In general this is a well appearing caucasian female in no acute distress. She's alert and oriented x4 and appropriate throughout the examination. Cardiopulmonary assessment is negative for acute distress and she exhibits normal effort. Bilateral breast exam is deferred.    ECOG = 0  0 - Asymptomatic (Fully active, able to carry on all predisease activities without restriction)  1 - Symptomatic but completely ambulatory (Restricted in physically strenuous activity but ambulatory and able to carry out work of a light or sedentary nature. For example, light housework, office work)  2 - Symptomatic, <50% in bed  during the day (Ambulatory and capable of all self care but unable to carry out any work activities. Up and about more than 50% of waking hours)  3 - Symptomatic, >50% in bed, but not bedbound (Capable of only limited self-care, confined to bed or chair 50% or more of waking hours)  4 - Bedbound (Completely disabled. Cannot carry on any self-care. Totally confined to bed or chair)  5 - Death   Eustace Pen MM, Creech RH, Tormey DC, et al. [phone]). "Toxicity and response criteria of the [facility]". [place] Oncol. 5 (6): 649-55    LABORATORY DATA:  Lab Results  Component Value Date   WBC 4.5 10/24/2019   HGB 13.4 10/24/2019   HCT 40.7 10/24/2019   MCV 94.2 10/24/2019   PLT 200 10/24/2019   Lab Results  Component Value Date   NA 137 10/24/2019   K 3.8 10/24/2019   CL 100 10/24/2019   CO2 27 10/24/2019   Lab Results  Component Value Date   ALT 17 10/24/2019   AST 21 10/24/2019   ALKPHOS 69 10/24/2019   BILITOT 1.1 10/24/2019      RADIOGRAPHY: No results found.     IMPRESSION/PLAN: 1. Stage IA, [id number] grade 2 ER/PR positive invasive ductal carcinoma of the left breast. Dr. Lisbeth Renshaw discusses the pathology findings and reviews the nature of left breast disease. We reviewed the recommendations to proceed with breast conservation with lumpectomy. Depending on the size of the final tumor measurements rendered by pathology, the tumor may be tested for Oncotype Dx score to determine a role for systemic therapy. Provided that chemotherapy is not indicated, the patient's course would then be followed by external radiotherapy to the breast  to reduce risks of local recurrence followed by antiestrogen therapy. We discussed the risks, benefits, short, and long term effects of radiotherapy, as well as the curative intent, and the patient is interested in proceeding. Dr. Lisbeth Renshaw discusses the delivery and logistics of radiotherapy and anticipates a course of 4 weeks of  radiotherapy. We will see her back a few weeks after surgery to discuss the simulation process and anticipate we starting radiotherapy about 4-6 weeks after surgery. We will ask navigation to reach out since she does not have an appointment with medical oncology.    In a visit lasting 60 minutes, greater than 50% of the time was spent face to face reviewing her case, as well as in preparation of, discussing, and coordinating the patient's care.  The above documentation reflects my direct findings during this shared patient visit. Please see the separate note by Dr. Lisbeth Renshaw on this date for the remainder of the patient's plan of care.    Carola Rhine, [facility]    **Disclaimer: This note was dictated with voice recognition software. Similar sounding words can inadvertently be transcribed and this note may contain transcription errors which may not have been corrected upon publication of note.**

## 2020-03-09 NOTE — Addendum Note (Signed)
Encounter addended by: Cori Razor, RN on: 03/09/2020 3:45 PM  Actions taken: Flowsheet accepted, Actions taken from a BestPractice Advisory, Order list changed, Diagnosis association updated

## 2020-03-09 NOTE — Addendum Note (Signed)
Encounter addended by: Cori Razor, RN on: 03/09/2020 3:34 PM  Actions taken: Charge Capture section accepted

## 2020-03-10 ENCOUNTER — Encounter: Payer: Self-pay | Admitting: Licensed Clinical Social Worker

## 2020-03-10 NOTE — Progress Notes (Signed)
Received VM from patient returning call at 12:14pm on 03/10/20.  CSW attempted to call patient back. Left VM.   Christeen Douglas, LCSW

## 2020-03-10 NOTE — Progress Notes (Signed)
[facility] Psychosocial Distress Screening Clinical Social Work  Clinical Social Work was referred by distress screening protocol.  The patient scored a 5 on the Psychosocial Distress Thermometer which indicates moderate distress. Clinical Social Worker attempted to contact patient by phone to assess for distress and other psychosocial needs.  No answer. Left VM with direct contact information.  ONCBCN DISTRESS SCREENING 03/09/2020  Screening Type Initial Screening  Distress experienced in past week (1-10) 5  Emotional problem type Nervousness/Anxiety  Information Concerns Type Lack of info about treatment;Lack of info about complementary therapy choices  Other [email or web address]      Caidynce Muzyka E Kristia Jupiter, LCSW

## 2020-03-15 ENCOUNTER — Telehealth: Payer: Self-pay | Admitting: Oncology

## 2020-03-15 ENCOUNTER — Other Ambulatory Visit: Payer: Self-pay | Admitting: Surgery

## 2020-03-15 DIAGNOSIS — D242 Benign neoplasm of left breast: Secondary | ICD-10-CM

## 2020-03-15 DIAGNOSIS — Z853 Personal history of malignant neoplasm of breast: Secondary | ICD-10-CM

## 2020-03-15 NOTE — Telephone Encounter (Signed)
Received new patient referrals from Dr. Ninfa Linden for genetics and med onc. Ms. Pecha has been scheduled to see Dr. Jana Hakim on 3/16 at 4pm an dKaren for genetics at 2pm.

## 2020-03-16 NOTE — Progress Notes (Signed)
[place]  [phone] [phone]     ID: Etha Stambaugh DOB: 18-Jun-1945  MR#: [id number]  PPI#:[id number]  Patient Care Team: Carol Ada, MD as PCP - General (Family Medicine) Skeet Latch, MD as PCP - Cardiology (Cardiology) Magrinat, Virgie Dad, MD as Consulting Physician (Oncology) Coralie Keens, MD as Consulting Physician (General Surgery) Kyung Rudd, MD as Consulting Physician (Radiation Oncology) Wilford Corner, MD as Consulting Physician (Gastroenterology) Chauncey Cruel, MD OTHER MD:  CHIEF COMPLAINT: Estrogen receptor positive breast cancer  CURRENT TREATMENT: Definitive surgery pending   HISTORY OF CURRENT ILLNESS: Chrishonda Hesch had routine screening mammography on 01/06/2020 showing a possible abnormality in the left breast. She underwent left diagnostic mammography with tomography and left breast ultrasonography at [facility] on 01/13/2020 showing: breast density category B; two adjacent masses in left breast at 3 o'clock-- one measuring 1 cm and the other 0.8 cm; small asymmetry 3 cm posterior and medial to mass on mammography, no sonographic correlate; 1.6 cm group of indeterminate calcifications in retroareolar medial left breast; normal-appearing lymph nodes.  Accordingly on 02/12/2020 she proceeded to biopsy of the left breast area in question. The pathology from this procedure ([id number]) showed:  1. Left Breast, Retroareolar  - intraductal papilloma with atypia and associated dystrophic calcifications 2. Left Breast, 3 o'clock  - invasive ductal carcinoma, grade 2  - ductal carcinoma in situ, intermediate grade   - Prognostic indicators significant for: estrogen receptor, >95% positive and progesterone receptor, >95% positive, both with strong staining intensity. Proliferation marker Ki67 at 5%. HER2 negative by immunohistochemistry (0).  Cancer Staging Malignant neoplasm of upper-outer quadrant of left breast in female,  estrogen receptor positive ([place]) Staging form: Breast, AJCC 8th Edition - Clinical stage from 03/09/2020: Stage IA (cT1c, cN0, cM0, G2, ER+, PR+, HER2-) - Signed by Chauncey Cruel, MD on 03/17/2020 Stage prefix: Initial diagnosis Method of lymph node assessment: Clinical Histologic grading system: 3 grade system  The patient's subsequent history is as detailed below.   INTERVAL HISTORY: Shavonta was evaluated in the breast cancer clinic on 03/17/2020. Her case was not yet been presented at the multidisciplinary breast cancer conference.  She met with Dr. Lisbeth Renshaw on 03/09/2020 to discuss radiation therapy.  She is scheduled for left lumpectomy on 03/25/2020 under Dr. Ninfa Linden.   REVIEW OF SYSTEMS: There were no specific symptoms leading to the original mammogram, which was routinely scheduled. The patient denies unusual headaches, visual changes, nausea, vomiting, stiff neck, dizziness, or gait imbalance. There has been no cough, phlegm production, or pleurisy, no chest pain or pressure, and no change in bowel or bladder habits. The patient denies fever, rash, bleeding, unexplained fatigue or unexplained weight loss.  She exercises chiefly by walking her dog and working on their [place].  A detailed review of systems was otherwise entirely negative.   COVID 19 VACCINATION STATUS: [place] x2; infection 10/24/2019, no booster as of March 2022   PAST MEDICAL HISTORY: Past Medical History:  Diagnosis Date  . Breast cancer ([place]) 02/12/2020  . Diverticulosis   . Elevated blood pressure reading    165/71 a pre-op appt, reports she has seen her primary care in the last few months and intheir office it was in the 323F systolic, was not started on meds at that time, says her bp has "been creeping up since all this stuff started" , denies acute cardiac symptoms   . Hearing loss    wears hearing aids  . History of anemia as  a child   . Hypercholesteremia    diet controlled  . Hyperlipidemia  02/16/2016  . Hypothyroid   . Hypothyroidism 02/16/2016    PAST SURGICAL HISTORY: Past Surgical History:  Procedure Laterality Date  . APPENDECTOMY    . BUNIONECTOMY    . CATARACT EXTRACTION    . CESAREAN SECTION    . CHOLECYSTECTOMY    . FOOT SURGERY Right 2011   bone graft   . HYSTEROSCOPY WITH D & C N/A 06/11/2018   Procedure: DILATATION AND CURETTAGE /HYSTEROSCOPY WITH MYOSURE POLYPECTOMY;  Surgeon: Lavonia Drafts, MD;  Location: [place];  Service: Gynecology;  Laterality: N/A;  . NASAL SEPTUM SURGERY     at age 69  . ROBOTIC ASSISTED TOTAL HYSTERECTOMY WITH BILATERAL SALPINGO OOPHERECTOMY Bilateral 07/17/2018   Procedure: XI ROBOTIC ASSISTED TOTAL HYSTERECTOMY WITH BILATERAL SALPINGO OOPHORECTOMY AND LEFT PELVIC LYMPADECTOMY;  Surgeon: Everitt Amber, MD;  Location: [facility];  Service: Gynecology;  Laterality: Bilateral;  . SENTINEL NODE BIOPSY N/A 07/17/2018   Procedure: SENTINEL NODE BIOPSY;  Surgeon: Everitt Amber, MD;  Location: [facility];  Service: Gynecology;  Laterality: N/A;  . TONSILLECTOMY      FAMILY HISTORY: Family History  Problem Relation Age of Onset  . Hypertension Mother   . Heart attack Mother   . Heart attack Father   . Lung cancer Father   . Diabetes Sister   . Cervical cancer Sister   . Skin cancer Sister   . Diabetes Brother   . CAD Brother   . Lymphoma Brother   . Skin cancer Brother   . Diabetes Brother   . Skin cancer Brother   . Skin cancer Brother   . Prostate cancer Brother   . Diverticulitis Brother   . Skin cancer Brother   . Skin cancer Sister   The patient's father died at the age of 63 from lung cancer in the setting of tobacco abuse.  His mother had colon cancer and his father may have had some unknown type of cancer.  The patient's father had 1 brother and 1 sister with no cancer and also no children.  The patient's mother died at the age of 23 from renal failure.  Her parents did not have cancer.  She had 1 sister with breast cancer at  the age of 42, 1 sister with lung cancer and a second sister with         .  Also on the maternal side there were 2 cousins with lung cancer and one with leukemia.   GYNECOLOGIC HISTORY:  No LMP recorded (lmp unknown). Patient is postmenopausal. Menarche: 75 years old Age at first live birth: 75 years old [place] P 1 LMP age 33 Contraceptive HRT at least 5 years  Hysterectomy? Yes, 07/2018, for grade 1 endometrial cancer, final pathology showed no residual carcinoma BSO? 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  She lives with her husband Mortimer Fries, currently 93, who is a retired Financial risk analyst working for [employer].  Their daughter Berline Chough, 23, lives in [place] and is a clinical psychiatrist working at the state mental hospital.  The patient has no grandchildren.  She attends [employer] and also a [employer] which is her husband's denomination.  They both go to both churches every Sunday and participated in both communities.  ADVANCED DIRECTIVES: In the absence of any documentation to the contrary, the patient's spouse is their HCPOA.  HEALTH MAINTENANCE: Social History   Tobacco Use  . Smoking status: Never Smoker  . Smokeless tobacco: Never Used  Vaping Use  . Vaping Use: Never used  Substance Use Topics  . Alcohol use: Not Currently    Comment: one to two beers a year  . Drug use: No     Colonoscopy: April 2014/ [facility]  PAP: 01/2016, negative  Bone density:    Allergies  Allergen Reactions  . Crestor [Rosuvastatin] Other (See Comments)    myalgia  . Lipitor [Atorvastatin] Other (See Comments)    myalgia  . Acetaminophen Itching and Other (See Comments)    "hyperactivity"  . Vytorin [Ezetimibe-Simvastatin] Other (See Comments)    myalgia    Current Outpatient Medications  Medication Sig Dispense Refill  . b complex vitamins tablet Take 1 tablet by mouth daily.    .  Calcium Citrate-Vitamin D (CALCIUM + D PO) Take 1 tablet by mouth daily.    . Cholecalciferol (VITAMIN D) 125 MCG (5000 UT) CAPS Take 5,000 Units by mouth daily.    Marland Kitchen levothyroxine (SYNTHROID, LEVOTHROID) 75 MCG tablet Take 75 mcg by mouth daily.    . Multiple Vitamin (MULTIVITAMIN WITH MINERALS) TABS tablet Take 1 tablet by mouth daily.    Marland Kitchen REPATHA SURECLICK 161 MG/ML SOAJ     . Tetrahydrozoline HCl (VISINE OP) Place 1 drop into both eyes daily as needed (irritation).    . vitamin C (ASCORBIC ACID) 500 MG tablet Take 500 mg by mouth daily.    . benzonatate (TESSALON) 100 MG capsule Take 1 capsule (100 mg total) by mouth every 8 (eight) hours. 21 capsule 0   No current facility-administered medications for this visit.    OBJECTIVE: White woman in no acute distress  Vitals:   03/17/20 1535  BP: (!) 142/47  Pulse: 72  Resp: 18  Temp: (!) 97.3 F (36.3 C)  SpO2: 100%     Body mass index is 36.54 kg/m.   Wt Readings from Last 3 Encounters:  03/17/20 203 lb (92.1 kg)  03/09/20 201 lb 2 oz (91.2 kg)  10/24/19 200 lb (90.7 kg)      ECOG FS:1 - Symptomatic but completely ambulatory  Ocular: Sclerae unicteric, pupils round and equal Ear-nose-throat: Wearing a mask Lymphatic: No cervical or supraclavicular adenopathy Lungs no rales or rhonchi Heart regular rate and rhythm Abd soft, nontender, positive bowel sounds MSK no focal spinal tenderness, no joint edema Neuro: non-focal, well-oriented, appropriate affect Breasts: The right breast is unremarkable.  The left breast is status post recent biopsy.  I do not palpate a mass.  There are no skin or nipple changes of concern both axillae are benign.   LAB RESULTS:  CMP     Component Value Date/Time   NA 137 10/24/2019 1052   K 3.8 10/24/2019 1052   CL 100 10/24/2019 1052   CO2 27 10/24/2019 1052   GLUCOSE 114 (H) 10/24/2019 1052   BUN 15 10/24/2019 1052   CREATININE 1.00 10/24/2019 1052   CREATININE 0.99 08/30/2018 1214    CALCIUM 8.6 (L) 10/24/2019 1052   PROT 7.6 10/24/2019 1052   ALBUMIN 4.0 10/24/2019 1052   AST 21 10/24/2019 1052   ALT 17 10/24/2019 1052   ALKPHOS 69 10/24/2019 1052   BILITOT 1.1 10/24/2019 1052   GFRNONAA 60 (L) 10/24/2019 1052   GFRNONAA 57 (L) 08/30/2018 1214   GFRAA >60 08/30/2018 1214    No results found for: TOTALPROTELP, ALBUMINELP, A1GS, A2GS, BETS, BETA2SER, GAMS,  MSPIKE, SPEI  Lab Results  Component Value Date   WBC 4.5 10/24/2019   NEUTROABS 3.4 10/24/2019   HGB 13.4 10/24/2019   HCT 40.7 10/24/2019   MCV 94.2 10/24/2019   PLT 200 10/24/2019    No results found for: LABCA2  No components found for: QMGNOI370  No results for input(s): INR in the last 168 hours.  No results found for: LABCA2  No results found for: WUG891  No results found for: QXI503  No results found for: UUE280  No results found for: CA2729  No components found for: HGQUANT  No results found for: CEA1 / No results found for: CEA1   No results found for: AFPTUMOR  No results found for: CHROMOGRNA  No results found for: KPAFRELGTCHN, LAMBDASER, KAPLAMBRATIO (kappa/lambda light chains)  No results found for: HGBA, HGBA2QUANT, HGBFQUANT, HGBSQUAN (Hemoglobinopathy evaluation)   No results found for: LDH  No results found for: IRON, TIBC, IRONPCTSAT (Iron and TIBC)  No results found for: FERRITIN  Urinalysis    Component Value Date/Time   COLORURINE STRAW (A) 08/29/2018 1148   APPEARANCEUR CLEAR 08/29/2018 1148   LABSPEC 1.005 08/29/2018 1148   PHURINE 6.0 08/29/2018 [place] 08/29/2018 [place] 08/29/2018 [place] 08/29/2018 [place] 08/29/2018 [place] 08/29/2018 1148   UROBILINOGEN 0.2 11/19/2011 1851   NITRITE NEGATIVE 08/29/2018 1148   LEUKOCYTESUR NEGATIVE 08/29/2018 1148     STUDIES: No results found. Final Pathologic Diagnosis     A.  LEFT BREAST RETROAREOLAR,  BIOPSY:               INTRADUCTAL PAPILLOMA WITH ATYPIA AND ASSOCIATED DYSTROPHIC CALCIFICATIONS.  B.  LEFT BREAST 3:00, 8 CM FROM NIPPLE, BIOPSY:               INVASIVE DUCTAL CARCINOMA, NOTTINGHAM HISTOLOGICAL GRADE 2, INTERMEDIATE GRADE              (3/3 TUBULE FORMATION; 2/3 NUCLEAR GRADE; 1/3 MITOTIC ACTIVITY).               DUCTAL CARCINOMA IN SITU, DCIS, SOLID AND CRIBRIFORM, INTERMEDIATE NUCLEAR GRADE.   Electronically signed by Audie Box, MD on 02/16/2020 at 5:02 PM  Comment    In biopsy B there are tumor cells infiltrating the stroma as outlined by negative SMM immunohistochemical stain.  Tumor cells focally infiltrate in an [place] file pattern, however E-cadherin immunohistochemical stain is positive, consistent with ductal origin.  Breast prognostic studies will be performed and reported separately.  This case has been reviewed by Dr Joya Salm who is in essential agreement with the above diagnosis.  Positive and negative controls were reviewed as stained appropriately.  The performance characteristics of the immunohistochemical stains listed above have been determined by [facility], Department of Pathology or by the institution from which the stains were obtained. These tests have not been cleared nor approved by the [place] Food and Drug Administration. The FDA has determined that such clearance or approval is not necessary. These tests are used for clinical purposes and should not be regarded as investigational or for research. The laboratory is certified under the Clinical Laboratory Improvement Amendments of 1988 ( CLIA) as qualified to perform high complexity clinical laboratory testing.    Amended to correct date of service (collection date) from 02/17/2020 to 02/12/2020; NO CHANGE IN FINAL DIAGNOSIS   Final Pathologic Diagnosis    Prognostic Markers  in Cancer  Block Number:  [id number] Diagnosis:  Invasive ductal  carcinoma   Results:  Estrogen Receptor:  Positive (>95%, strong intensity)  Progesterone Receptor:  Positive (>95%, strong intensity)   Her2:  Negative (score=0)    Ki-67:  Percentage of tumor cells with nuclear positivity: 5 %                         ELIGIBLE FOR AVAILABLE RESEARCH PROTOCOL: no  ASSESSMENT: 75 y.o. [place] woman status post left breast biopsy 02/12/2020 for a clinical T1b N0, stage IA invasive ductal carcinoma, grade 2, estrogen and progesterone receptor positive, HER-2 nonamplified, with an MIB-1 of 5%.  (1) history of stage Ia grade 1 endometrioid endometrial cancer, status post robotic assisted total hysterectomy with bilateral salpingo-oophorectomy, sentinel lymph node biopsy and left pelvic lymphadenectomy 07/17/2018  (a) given good prognosis no adjuvant therapy required for the endometrial cancer  (2) genetics testing 03/17/2020  (3) definitive breast surgery pending  (4) adjuvant radiation  (5) antiestrogens   PLAN: I met today with Devonne to review her new diagnosis. Specifically we discussed the biology of her breast cancer, its diagnosis, staging, treatment  options and prognosis. We first reviewed the fact that cancer is not one disease but more than 100 different diseases and that it is important to keep them separate-- otherwise when friends and relatives discuss their own cancer experiences with Ronnetta confusion can result. Similarly we explained that if breast cancer spreads to the bone or liver, the patient would not have bone cancer or liver cancer, but breast cancer in the bone and breast cancer in the liver: one cancer in three places-- not 3 different cancers which otherwise would have to be treated in 3 different ways.  We discussed the difference between local and systemic therapy. In terms of loco-regional treatment, lumpectomy plus radiation is equivalent to mastectomy as far as survival is concerned. For this reason, and because  the cosmetic results are generally superior, we recommend breast conserving surgery.   We then discussed the rationale for systemic therapy. There is some risk that this cancer may have already spread to other parts of her body. Patients frequently ask at this point about bone scans, CAT scans and PET scans to find out if they have occult breast cancer somewhere else. The problem is that in early stage disease we are much more likely to find false positives then true cancers and this would expose the patient to unnecessary procedures as well as unnecessary radiation. Scans cannot answer the question the patient really would like to know, which is whether she has microscopic disease elsewhere in her body. For those reasons we do not recommend them.  Of course we would proceed to aggressive evaluation of any symptoms that might suggest metastatic disease, but that is not the case here.  Next we went over the options for systemic therapy which are anti-estrogens, anti-HER-2 immunotherapy, and chemotherapy. Raynetta does not meet criteria for anti-HER-2 immunotherapy. She is a good candidate for anti-estrogens.  The question of chemotherapy is more complicated. Chemotherapy is most effective in rapidly growing, aggressive tumors. It is much less effective in slow growing cancers, like Azalyn 's. For that reason and taking into consideration the patient's age we will not request an Oncotype and do not plan on adjuvant chemotherapy.  [place] does qualify for genetics testing. In patients who carry a deleterious mutation [for example in a  BRCA gene], the risk of a  new breast cancer developing in the future may be sufficiently great that the patient may choose bilateral mastectomies. However if she wishes to keep her breasts in that situation it is safe to do so. That would require intensified screening, which generally means not only yearly mammography but a yearly breast MRI as well.  Kaizley is clear that if she did carry  a deleterious mutation she would opt for intensified screening so there is no need to postpone her surgery until we have a definitive genetic result  I am actually more concerned with the possibility of her carrying Lynch syndrome genes.  We will have those results of course within 2 weeks.  Avriel has a good understanding of the overall plan. She agrees with it. She knows the goal of treatment in her case is cure. She will call with any problems that may develop before her next visit here.  Total encounter time 65 minutes.   Virgie Dad. Magrinat, MD 03/17/2020 4:57 PM Medical Oncology and Hematology [facility] [place], [place] [address] Tel. [phone]    Fax. [phone]   This document serves as a record of services personally performed by Lurline Del, MD. It was created on his behalf by Wilburn Mylar, a trained medical scribe. The creation of this record is based on the scribe's personal observations and the provider's statements to them.   I, Lurline Del MD, have reviewed the above documentation for accuracy and completeness, and I agree with the above.    *Total Encounter Time as defined by the Centers for Medicare and Medicaid Services includes, in addition to the face-to-face time of a patient visit (documented in the note above) non-face-to-face time: obtaining and reviewing outside history, ordering and reviewing medications, tests or procedures, care coordination (communications with other health care professionals or caregivers) and documentation in the medical record.

## 2020-03-17 ENCOUNTER — Inpatient Hospital Stay: Payer: Medicare PPO

## 2020-03-17 ENCOUNTER — Inpatient Hospital Stay: Payer: Medicare PPO | Attending: Oncology | Admitting: Oncology

## 2020-03-17 ENCOUNTER — Inpatient Hospital Stay (HOSPITAL_BASED_OUTPATIENT_CLINIC_OR_DEPARTMENT_OTHER): Payer: Medicare PPO | Admitting: Genetic Counselor

## 2020-03-17 ENCOUNTER — Other Ambulatory Visit: Payer: Self-pay

## 2020-03-17 VITALS — BP 142/47 | HR 72 | Temp 97.3°F | Resp 18 | Ht 62.5 in | Wt 203.0 lb

## 2020-03-17 DIAGNOSIS — Z90722 Acquired absence of ovaries, bilateral: Secondary | ICD-10-CM | POA: Diagnosis not present

## 2020-03-17 DIAGNOSIS — Z807 Family history of other malignant neoplasms of lymphoid, hematopoietic and related tissues: Secondary | ICD-10-CM | POA: Diagnosis not present

## 2020-03-17 DIAGNOSIS — Z803 Family history of malignant neoplasm of breast: Secondary | ICD-10-CM

## 2020-03-17 DIAGNOSIS — Z8042 Family history of malignant neoplasm of prostate: Secondary | ICD-10-CM | POA: Insufficient documentation

## 2020-03-17 DIAGNOSIS — Z801 Family history of malignant neoplasm of trachea, bronchus and lung: Secondary | ICD-10-CM | POA: Insufficient documentation

## 2020-03-17 DIAGNOSIS — Z833 Family history of diabetes mellitus: Secondary | ICD-10-CM | POA: Insufficient documentation

## 2020-03-17 DIAGNOSIS — E039 Hypothyroidism, unspecified: Secondary | ICD-10-CM | POA: Diagnosis not present

## 2020-03-17 DIAGNOSIS — Z79899 Other long term (current) drug therapy: Secondary | ICD-10-CM | POA: Diagnosis not present

## 2020-03-17 DIAGNOSIS — Z9079 Acquired absence of other genital organ(s): Secondary | ICD-10-CM | POA: Diagnosis not present

## 2020-03-17 DIAGNOSIS — C50412 Malignant neoplasm of upper-outer quadrant of left female breast: Secondary | ICD-10-CM | POA: Insufficient documentation

## 2020-03-17 DIAGNOSIS — Z170 Estrogen receptor positive status [ER+]: Secondary | ICD-10-CM | POA: Diagnosis not present

## 2020-03-17 DIAGNOSIS — Z800 Family history of malignant neoplasm of digestive organs: Secondary | ICD-10-CM

## 2020-03-17 DIAGNOSIS — Z8249 Family history of ischemic heart disease and other diseases of the circulatory system: Secondary | ICD-10-CM | POA: Diagnosis not present

## 2020-03-17 DIAGNOSIS — Z8049 Family history of malignant neoplasm of other genital organs: Secondary | ICD-10-CM | POA: Insufficient documentation

## 2020-03-17 DIAGNOSIS — Z90710 Acquired absence of both cervix and uterus: Secondary | ICD-10-CM | POA: Diagnosis not present

## 2020-03-17 DIAGNOSIS — C50912 Malignant neoplasm of unspecified site of left female breast: Secondary | ICD-10-CM | POA: Diagnosis not present

## 2020-03-17 DIAGNOSIS — C541 Malignant neoplasm of endometrium: Secondary | ICD-10-CM | POA: Diagnosis not present

## 2020-03-17 DIAGNOSIS — E7800 Pure hypercholesterolemia, unspecified: Secondary | ICD-10-CM | POA: Diagnosis not present

## 2020-03-17 DIAGNOSIS — E785 Hyperlipidemia, unspecified: Secondary | ICD-10-CM | POA: Insufficient documentation

## 2020-03-17 DIAGNOSIS — Z808 Family history of malignant neoplasm of other organs or systems: Secondary | ICD-10-CM

## 2020-03-18 ENCOUNTER — Encounter: Payer: Self-pay | Admitting: *Deleted

## 2020-03-18 ENCOUNTER — Telehealth: Payer: Self-pay | Admitting: Oncology

## 2020-03-18 ENCOUNTER — Encounter: Payer: Self-pay | Admitting: Genetic Counselor

## 2020-03-18 DIAGNOSIS — Z803 Family history of malignant neoplasm of breast: Secondary | ICD-10-CM | POA: Insufficient documentation

## 2020-03-18 DIAGNOSIS — Z800 Family history of malignant neoplasm of digestive organs: Secondary | ICD-10-CM | POA: Insufficient documentation

## 2020-03-18 DIAGNOSIS — Z808 Family history of malignant neoplasm of other organs or systems: Secondary | ICD-10-CM | POA: Insufficient documentation

## 2020-03-18 NOTE — Telephone Encounter (Signed)
Scheduled appt per 3/16 los. Left vm with appt date and time.

## 2020-03-18 NOTE — Progress Notes (Signed)
REFERRING PROVIDER: Chauncey Cruel, MD [address] [place],  [place] [address]  PRIMARY PROVIDER:  Carol Ada, MD  PRIMARY REASON FOR VISIT:  1. Endometrial cancer ([place])   2. Family history of breast cancer   3. Family history of melanoma   4. Family history of colon cancer   5. Malignant neoplasm of upper-outer quadrant of left breast in female, estrogen receptor positive ([place])      HISTORY OF PRESENT ILLNESS:   Elizabeth Maldonado, a 75 y.o. female, was seen for a [facility] cancer genetics consultation at the request of Dr. Jana Hakim due to a personal and family history of cancer.  Ms. Kirn presents to clinic today to discuss the possibility of a hereditary predisposition to cancer, genetic testing, and to further clarify her future cancer risks, as well as potential cancer risks for family members.   In 2020, at the age of 39, Ms. Apgar was diagnosed with uterine cancer.  MSI/IHC was normal. This was treated by surgery.  In 2022, at the age of 44, Ms. Todt was diagnosed with breast cancer.   The treatment plan includes a lumpectomy on March 25, 2020.Marland Kitchen     CANCER HISTORY:  Oncology History  Malignant neoplasm of upper-outer quadrant of left breast in female, estrogen receptor positive ([place])  03/09/2020 Initial Diagnosis   Malignant neoplasm of upper-outer quadrant of left breast in female, estrogen receptor positive ([place])   03/09/2020 Cancer Staging   Staging form: Breast, AJCC 8th Edition - Clinical stage from 03/09/2020: Stage IA (cT1c, cN0, cM0, G2, ER+, PR+, HER2-) - Signed by Chauncey Cruel, MD on 03/17/2020 Stage prefix: Initial diagnosis Method of lymph node assessment: Clinical Histologic grading system: 3 grade system      RISK FACTORS:  Menarche was at age 36.  First live birth at age 86.    Ovaries intact: no.  Hysterectomy: yes.  Menopausal status: postmenopausal.  HRT use: 5 years. Colonoscopy: yes; normal. Mammogram within the last year:  yes. Number of breast biopsies: 2. Up to date with pelvic exams: yes. Any excessive radiation exposure in the past: no  Past Medical History:  Diagnosis Date  . Breast cancer ([place]) 02/12/2020  . Diverticulosis   . Elevated blood pressure reading    165/71 a pre-op appt, reports she has seen her primary care in the last few months and intheir office it was in the 831D systolic, was not started on meds at that time, says her bp has "been creeping up since all this stuff started" , denies acute cardiac symptoms   . Family history of breast cancer   . Family history of colon cancer   . Family history of melanoma   . Hearing loss    wears hearing aids  . History of anemia as a child   . Hypercholesteremia    diet controlled  . Hyperlipidemia 02/16/2016  . Hypothyroid   . Hypothyroidism 02/16/2016    Past Surgical History:  Procedure Laterality Date  . APPENDECTOMY    . BUNIONECTOMY    . CATARACT EXTRACTION    . CESAREAN SECTION    . CHOLECYSTECTOMY    . FOOT SURGERY Right 2011   bone graft   . HYSTEROSCOPY WITH D & C N/A 06/11/2018   Procedure: DILATATION AND CURETTAGE /HYSTEROSCOPY WITH MYOSURE POLYPECTOMY;  Surgeon: Lavonia Drafts, MD;  Location: [facility];  Service: Gynecology;  Laterality: N/A;  . NASAL SEPTUM SURGERY     at age 42  . ROBOTIC  ASSISTED TOTAL HYSTERECTOMY WITH BILATERAL SALPINGO OOPHERECTOMY Bilateral 07/17/2018   Procedure: XI ROBOTIC ASSISTED TOTAL HYSTERECTOMY WITH BILATERAL SALPINGO OOPHORECTOMY AND LEFT PELVIC LYMPADECTOMY;  Surgeon: Everitt Amber, MD;  Location: [facility];  Service: Gynecology;  Laterality: Bilateral;  . SENTINEL NODE BIOPSY N/A 07/17/2018   Procedure: SENTINEL NODE BIOPSY;  Surgeon: Everitt Amber, MD;  Location: [facility];  Service: Gynecology;  Laterality: N/A;  . TONSILLECTOMY      Social History   Socioeconomic History  . Marital status: Married    Spouse name: Not on file  . Number of children: Not on file  . Years of education: Not on  file  . Highest education level: Not on file  Occupational History  . Not on file  Tobacco Use  . Smoking status: Never Smoker  . Smokeless tobacco: Never Used  Vaping Use  . Vaping Use: Never used  Substance and Sexual Activity  . Alcohol use: Not Currently    Comment: one to two beers a year  . Drug use: No  . Sexual activity: Not on file  Other Topics Concern  . Not on file  Social History Narrative  . Not on file   Social Determinants of Health   Financial Resource Strain: Not on file  Food Insecurity: Not on file  Transportation Needs: Not on file  Physical Activity: Not on file  Stress: Not on file  Social Connections: Not on file     FAMILY HISTORY:  We obtained a detailed, 4-generation family history.  Significant diagnoses are listed below: Family History  Problem Relation Age of Onset  . Hypertension Mother   . Heart attack Mother   . Heart attack Father   . Lung cancer Father   . Skin cancer Father   . Diabetes Sister   . Cervical cancer Sister   . Skin cancer Sister   . Diabetes Brother   . CAD Brother   . Lymphoma Brother   . Skin cancer Brother   . Diabetes Brother   . Melanoma Brother   . Melanoma Brother   . Prostate cancer Brother   . Diverticulitis Brother   . Skin cancer Brother   . Skin cancer Sister   . Breast cancer Maternal Aunt 35  . Colon cancer Paternal Grandmother   . Lung cancer Maternal Aunt   . Leukemia Cousin   . Lung cancer Cousin     The patient has one daughter who is cancer free.  She has five brothers and two sisters, and one maternal half brother.  Two brothers had melanoma, one sister had cervical cancer, one brother had prostate cancer and another with lymphoma.  Both parents are deceased.  The patient's father had lung and skin cancer.  He died at 18.  He had a brother and sister who are cancer free.  His parents are deceased.  The patient's mother had five sisters, one who had breast cancer at 47.  Her parents are  deceased from non-cancer related issues.  Ms. Leiterman is unaware of previous family history of genetic testing for hereditary cancer risks. Patient's maternal ancestors are of [place] descent, and paternal ancestors are of [place] descent. There is no reported Ashkenazi Jewish ancestry. There is no known consanguinity.  GENETIC COUNSELING ASSESSMENT: Ms. Fessenden is a 75 y.o. female with a personal and family history of cancer which is somewhat suggestive of a hereditary cancer syndrome and predisposition to cancer given the combination of cancer and young ages of onset. We, therefore,  discussed and recommended the following at today's visit.   DISCUSSION: We discussed that 5 - 10% of breast cancer is hereditary, with most cases associated with BRCA mutations.  There are other genes that can be associated with hereditary breast cancer syndromes.  These include ATM, CHEK2 and PALB2.  There is also slight concern for Lynch syndrome based on the family history of colon and prostate cancer.  We discussed that testing is beneficial for several reasons including knowing how to follow individuals and understand if other family members could be at risk for cancer and allow them to undergo genetic testing.   We reviewed the characteristics, features and inheritance patterns of hereditary cancer syndromes. We also discussed genetic testing, including the appropriate family members to test, the process of testing, insurance coverage and turn-around-time for results. We discussed the implications of a negative, positive, carrier and/or variant of uncertain significant result. We recommended Ms. Daniel pursue genetic testing for the CancerNext-Expanded+RNAinsight gene panel. The CancerNext-Expanded gene panel offered by [facility] and includes sequencing and rearrangement analysis for the following 77 genes: AIP, ALK, APC*, ATM*, AXIN2, BAP1, BARD1, BLM, BMPR1A, BRCA1*, BRCA2*, BRIP1*, CDC73, CDH1*, CDK4, CDKN1B,  CDKN2A, CHEK2*, CTNNA1, DICER1, FANCC, FH, FLCN, GALNT12, KIF1B, LZTR1, MAX, MEN1, MET, MLH1*, MSH2*, MSH3, MSH6*, MUTYH*, NBN, NF1*, NF2, NTHL1, PALB2*, PHOX2B, PMS2*, POT1, PRKAR1A, PTCH1, PTEN*, RAD51C*, RAD51D*, RB1, RECQL, RET, SDHA, SDHAF2, SDHB, SDHC, SDHD, SMAD4, SMARCA4, SMARCB1, SMARCE1, STK11, SUFU, TMEM127, TP53*, TSC1, TSC2, VHL and XRCC2 (sequencing and deletion/duplication); EGFR, EGLN1, HOXB13, KIT, MITF, PDGFRA, POLD1, and POLE (sequencing only); EPCAM and GREM1 (deletion/duplication only). DNA and RNA analyses performed for * genes.   Based on Ms. Spare's personal and family history of cancer, she meets medical criteria for genetic testing. Despite that she meets criteria, she may still have an out of pocket cost. We discussed that if her out of pocket cost for testing is over $100, the laboratory will call and confirm whether she wants to proceed with testing.  If the out of pocket cost of testing is less than $100 she will be billed by the genetic testing laboratory.   PLAN: After considering the risks, benefits, and limitations, Ms. Texidor provided informed consent to pursue genetic testing and the blood sample was sent to [facility] for analysis of the CancerNext-Expanded+RNAinsight. Results should be available within approximately 2-3 weeks' time, at which point they will be disclosed by telephone to Ms. Mcallister, as will any additional recommendations warranted by these results. Ms. Fredman will receive a summary of her genetic counseling visit and a copy of her results once available. This information will also be available in Epic.   Lastly, we encouraged Ms. Borras to remain in contact with cancer genetics annually so that we can continuously update the family history and inform her of any changes in cancer genetics and testing that may be of benefit for this family.   Ms. Bert questions were answered to her satisfaction today. Our contact information was provided should  additional questions or concerns arise. Thank you for the referral and allowing [place] to share in the care of your patient.   Hillel Card P. Florene Glen, [place], [facility] Licensed, Insurance risk surveyor [email or web address] phone: [phone]  The patient was seen for a total of 45 minutes in face-to-face genetic counseling.  This patient was discussed with Drs. Magrinat, Lindi Adie and/or Burr Medico who agrees with the above.    _______________________________________________________________________ For Office Staff:  Number of people involved in session: 1 Was an Psychologist, prison and probation services  involved with case: yes

## 2020-03-19 ENCOUNTER — Other Ambulatory Visit: Payer: Self-pay

## 2020-03-19 ENCOUNTER — Encounter (HOSPITAL_BASED_OUTPATIENT_CLINIC_OR_DEPARTMENT_OTHER): Payer: Self-pay | Admitting: Surgery

## 2020-03-22 ENCOUNTER — Other Ambulatory Visit (HOSPITAL_COMMUNITY)
Admission: RE | Admit: 2020-03-22 | Discharge: 2020-03-22 | Disposition: A | Discharge status: Home / Self Care | Payer: Medicare PPO | Source: Ambulatory Visit | Attending: Surgery | Admitting: Surgery

## 2020-03-22 DIAGNOSIS — Z01812 Encounter for preprocedural laboratory examination: Secondary | ICD-10-CM | POA: Diagnosis not present

## 2020-03-22 DIAGNOSIS — Z20822 Contact with and (suspected) exposure to covid-19: Secondary | ICD-10-CM | POA: Diagnosis not present

## 2020-03-22 LAB — SARS CORONAVIRUS 2 (TAT 6-24 HRS): SARS Coronavirus 2: NEGATIVE

## 2020-03-23 ENCOUNTER — Telehealth: Payer: Self-pay | Admitting: Genetic Counselor

## 2020-03-23 NOTE — Progress Notes (Signed)
      Enhanced Recovery after Surgery for Orthopedics Enhanced Recovery after Surgery is a protocol used to improve the stress on your body and your recovery after surgery.  Patient Instructions  . The night before surgery:  o No food after midnight. ONLY clear liquids after midnight  . The day of surgery (if you do NOT have diabetes):  o Drink ONE (1) Pre-Surgery Clear Ensure as directed.   o This drink was given to you during your hospital  pre-op appointment visit. o The pre-op nurse will instruct you on the time to drink the  Pre-Surgery Ensure depending on your surgery time. o Finish the drink at the designated time by the pre-op nurse.  o Nothing else to drink after completing the  Pre-Surgery Clear Ensure.  . The day of surgery (if you have diabetes): o Drink ONE (1) Gatorade 2 (G2) as directed. o This drink was given to you during your hospital  pre-op appointment visit.  o The pre-op nurse will instruct you on the time to drink the   Gatorade 2 (G2) depending on your surgery time. o Color of the Gatorade may vary. Red is not allowed. o Nothing else to drink after completing the  Gatorade 2 (G2).         If you have questions, please contact your surgeon's office.  Surgical soap given with instructions. Pt verbalizes understanding. 

## 2020-03-23 NOTE — Telephone Encounter (Signed)
I called to let her know that, per the lab, we most likely will NOT have her STAT results back in time for her surgery.  She was okay with this information and wants to keep things as they are.  [name] that we will let her know as soon as the results get back.

## 2020-03-24 ENCOUNTER — Ambulatory Visit
Admission: RE | Admit: 2020-03-24 | Discharge: 2020-03-24 | Disposition: A | Discharge status: Home / Self Care | Payer: Medicare PPO | Source: Ambulatory Visit | Attending: Surgery | Admitting: Surgery

## 2020-03-24 ENCOUNTER — Other Ambulatory Visit: Payer: Self-pay

## 2020-03-24 ENCOUNTER — Other Ambulatory Visit: Payer: Self-pay | Admitting: Surgery

## 2020-03-24 DIAGNOSIS — Z853 Personal history of malignant neoplasm of breast: Secondary | ICD-10-CM

## 2020-03-24 DIAGNOSIS — D242 Benign neoplasm of left breast: Secondary | ICD-10-CM

## 2020-03-24 DIAGNOSIS — D0512 Intraductal carcinoma in situ of left breast: Secondary | ICD-10-CM | POA: Diagnosis not present

## 2020-03-24 DIAGNOSIS — C50512 Malignant neoplasm of lower-outer quadrant of left female breast: Secondary | ICD-10-CM | POA: Diagnosis not present

## 2020-03-25 ENCOUNTER — Encounter: Payer: Self-pay | Admitting: *Deleted

## 2020-03-25 ENCOUNTER — Ambulatory Visit (HOSPITAL_BASED_OUTPATIENT_CLINIC_OR_DEPARTMENT_OTHER): Admission: RE | Admit: 2020-03-25 | Payer: Medicare PPO | Source: Home / Self Care | Admitting: Surgery

## 2020-03-25 SURGERY — BREAST LUMPECTOMY WITH RADIOACTIVE SEED LOCALIZATION
Anesthesia: General | Site: Breast | Laterality: Left

## 2020-03-26 ENCOUNTER — Other Ambulatory Visit (HOSPITAL_COMMUNITY): Payer: Self-pay | Admitting: Surgery

## 2020-03-26 ENCOUNTER — Other Ambulatory Visit: Payer: Self-pay | Admitting: Surgery

## 2020-03-26 DIAGNOSIS — Z171 Estrogen receptor negative status [ER-]: Secondary | ICD-10-CM

## 2020-03-29 ENCOUNTER — Encounter: Payer: Self-pay | Admitting: Genetic Counselor

## 2020-03-29 ENCOUNTER — Telehealth: Payer: Self-pay | Admitting: Genetic Counselor

## 2020-03-29 DIAGNOSIS — Z1379 Encounter for other screening for genetic and chromosomal anomalies: Secondary | ICD-10-CM | POA: Insufficient documentation

## 2020-03-29 NOTE — Telephone Encounter (Signed)
Negative genetic testing on the BRCAPlus panel test.  This looks at 8 genes that may play a role in surgical management.  We will call when the remainder of genes are back.

## 2020-03-30 ENCOUNTER — Other Ambulatory Visit: Payer: Self-pay

## 2020-03-30 ENCOUNTER — Ambulatory Visit (HOSPITAL_COMMUNITY)
Admission: RE | Admit: 2020-03-30 | Discharge: 2020-03-30 | Disposition: A | Discharge status: Home / Self Care | Payer: Medicare PPO | Source: Ambulatory Visit | Attending: Surgery | Admitting: Surgery

## 2020-03-30 DIAGNOSIS — Z171 Estrogen receptor negative status [ER-]: Secondary | ICD-10-CM | POA: Diagnosis not present

## 2020-03-30 DIAGNOSIS — C50012 Malignant neoplasm of nipple and areola, left female breast: Secondary | ICD-10-CM | POA: Insufficient documentation

## 2020-03-30 DIAGNOSIS — N6489 Other specified disorders of breast: Secondary | ICD-10-CM | POA: Diagnosis not present

## 2020-03-30 MED ORDER — GADOBUTROL 1 MMOL/ML IV SOLN
10.0000 mL | Freq: Once | INTRAVENOUS | Status: AC | PRN
  Administered 2020-03-30: 10 mL via INTRAVENOUS

## 2020-04-01 ENCOUNTER — Telehealth: Payer: Self-pay | Admitting: Genetic Counselor

## 2020-04-01 ENCOUNTER — Ambulatory Visit: Payer: Self-pay | Admitting: Genetic Counselor

## 2020-04-01 ENCOUNTER — Encounter: Payer: Self-pay | Admitting: *Deleted

## 2020-04-01 DIAGNOSIS — Z1379 Encounter for other screening for genetic and chromosomal anomalies: Secondary | ICD-10-CM

## 2020-04-01 NOTE — Telephone Encounter (Signed)
Revealed negative genetic testing.  Discussed that we do not know why she has breast cancer cancer or why there is cancer in the family. It could be due to a different gene that we are not testing, or maybe our current technology may not be able to pick something up.  It will be important for her to keep in contact with genetics to keep up with whether additional testing may be needed.   One VUS that will not change medical management.

## 2020-04-01 NOTE — Progress Notes (Signed)
HPI:  Ms. Bruster was previously seen in the [facility] clinic due to a personal and family history of cancer and concerns regarding a hereditary predisposition to cancer. Please refer to our prior cancer genetics clinic note for more information regarding our discussion, assessment and recommendations, at the time. Ms. Bocek recent genetic test results were disclosed to her, as were recommendations warranted by these results. These results and recommendations are discussed in more detail below.  CANCER HISTORY:  Oncology History  Malignant neoplasm of upper-outer quadrant of left breast in female, estrogen receptor positive ([place])  03/09/2020 Initial Diagnosis   Malignant neoplasm of upper-outer quadrant of left breast in female, estrogen receptor positive ([place])   03/09/2020 Cancer Staging   Staging form: Breast, AJCC 8th Edition - Clinical stage from 03/09/2020: Stage IA (cT1c, cN0, cM0, G2, ER+, PR+, HER2-) - Signed by Chauncey Cruel, MD on 03/17/2020 Stage prefix: Initial diagnosis Method of lymph node assessment: Clinical Histologic grading system: 3 grade system   04/01/2020 Genetic Testing   Negative genetic testing on the CancerNext-Expanded+RNAinsight.  PMS2 VUS identified.  The CancerNext-Expanded gene panel offered by [facility] and includes sequencing and rearrangement analysis for the following 77 genes: AIP, ALK, APC*, ATM*, AXIN2, BAP1, BARD1, BLM, BMPR1A, BRCA1*, BRCA2*, BRIP1*, CDC73, CDH1*, CDK4, CDKN1B, CDKN2A, CHEK2*, CTNNA1, DICER1, FANCC, FH, FLCN, GALNT12, KIF1B, LZTR1, MAX, MEN1, MET, MLH1*, MSH2*, MSH3, MSH6*, MUTYH*, NBN, NF1*, NF2, NTHL1, PALB2*, PHOX2B, PMS2*, POT1, PRKAR1A, PTCH1, PTEN*, RAD51C*, RAD51D*, RB1, RECQL, RET, SDHA, SDHAF2, SDHB, SDHC, SDHD, SMAD4, SMARCA4, SMARCB1, SMARCE1, STK11, SUFU, TMEM127, TP53*, TSC1, TSC2, VHL and XRCC2 (sequencing and deletion/duplication); EGFR, EGLN1, HOXB13, KIT, MITF, PDGFRA, POLD1, and POLE (sequencing only);  EPCAM and GREM1 (deletion/duplication only). DNA and RNA analyses performed for * genes. The report date is April 01, 2020. Negative genetic testing on the BRCAplus panel test.  The BRCAPlus gene panel offered by [facility] and includes sequencing and rearrangement analysis for the following 6 genes: BRCA1, BRCA2, CDH1, PALB2, PTEN, and TP53.   The report date is March 26, 2020.     FAMILY HISTORY:  We obtained a detailed, 4-generation family history.  Significant diagnoses are listed below: Family History  Problem Relation Age of Onset  . Hypertension Mother   . Heart attack Mother   . Heart attack Father   . Lung cancer Father   . Skin cancer Father   . Diabetes Sister   . Cervical cancer Sister   . Skin cancer Sister   . Diabetes Brother   . CAD Brother   . Lymphoma Brother   . Skin cancer Brother   . Diabetes Brother   . Melanoma Brother   . Melanoma Brother   . Prostate cancer Brother   . Diverticulitis Brother   . Skin cancer Brother   . Skin cancer Sister   . Breast cancer Maternal Aunt 35  . Colon cancer Paternal Grandmother   . Lung cancer Maternal Aunt   . Leukemia Cousin   . Lung cancer Cousin     The patient has one daughter who is cancer free.  She has five brothers and two sisters, and one maternal half brother.  Two brothers had melanoma, one sister had cervical cancer, one brother had prostate cancer and another with lymphoma.  Both parents are deceased.  The patient's father had lung and skin cancer.  He died at 92.  He had a brother and sister who are cancer free.  His parents are deceased.  The patient's mother had five sisters, one who had breast cancer at 67.  Her parents are deceased from non-cancer related issues.  Ms. Holley Bouche is unaware of previous family history of genetic testing for hereditary cancer risks. Patient's maternal ancestors are of [place] descent, and paternal ancestors are of [place] descent. There is no reported Ashkenazi Jewish  ancestry. There is no known consanguinity.  GENETIC TEST RESULTS: Genetic testing reported out on April 01, 2020 through the CancerNext-Expanded+RNAinsight cancer panel found no pathogenic mutations. The CancerNext-Expanded gene panel offered by [facility] and includes sequencing and rearrangement analysis for the following 77 genes: AIP, ALK, APC*, ATM*, AXIN2, BAP1, BARD1, BLM, BMPR1A, BRCA1*, BRCA2*, BRIP1*, CDC73, CDH1*, CDK4, CDKN1B, CDKN2A, CHEK2*, CTNNA1, DICER1, FANCC, FH, FLCN, GALNT12, KIF1B, LZTR1, MAX, MEN1, MET, MLH1*, MSH2*, MSH3, MSH6*, MUTYH*, NBN, NF1*, NF2, NTHL1, PALB2*, PHOX2B, PMS2*, POT1, PRKAR1A, PTCH1, PTEN*, RAD51C*, RAD51D*, RB1, RECQL, RET, SDHA, SDHAF2, SDHB, SDHC, SDHD, SMAD4, SMARCA4, SMARCB1, SMARCE1, STK11, SUFU, TMEM127, TP53*, TSC1, TSC2, VHL and XRCC2 (sequencing and deletion/duplication); EGFR, EGLN1, HOXB13, KIT, MITF, PDGFRA, POLD1, and POLE (sequencing only); EPCAM and GREM1 (deletion/duplication only). DNA and RNA analyses performed for * genes. The test report has been scanned into EPIC and is located under the Molecular Pathology section of the Results Review tab.  A portion of the result report is included below for reference.     We discussed with Ms. Sookdeo that because current genetic testing is not perfect, it is possible there may be a gene mutation in one of these genes that current testing cannot detect, but that chance is small.  We also discussed, that there could be another gene that has not yet been discovered, or that we have not yet tested, that is responsible for the cancer diagnoses in the family. It is also possible there is a hereditary cause for the cancer in the family that Ms. Bidinger did not inherit and therefore was not identified in her testing.  Therefore, it is important to remain in touch with cancer genetics in the future so that we can continue to offer Ms. Flud the most up to date genetic testing.   Genetic testing did identify a  variant of uncertain significance (VUS) was identified in the PMS2 gene called [id number].  At this time, it is unknown if this variant is associated with increased cancer risk or if this is a normal finding, but most variants such as this get reclassified to being inconsequential. It should not be used to make medical management decisions. With time, we suspect the lab will determine the significance of this variant, if any. If we do learn more about it, we will try to contact Ms. Daigle to discuss it further. However, it is important to stay in touch with [place] periodically and keep the address and phone number up to date.  ADDITIONAL GENETIC TESTING: We discussed with Ms. Montini that her genetic testing was fairly extensive.  If there are genes identified to increase cancer risk that can be analyzed in the future, we would be happy to discuss and coordinate this testing at that time.    CANCER SCREENING RECOMMENDATIONS: Ms. Cuyler test result is considered negative (normal).  This means that we have not identified a hereditary cause for her personal and family history of cancer at this time. Most cancers happen by chance and this negative test suggests that her cancer may fall into this category.    While reassuring, this does not definitively rule out a hereditary  predisposition to cancer. It is still possible that there could be genetic mutations that are undetectable by current technology. There could be genetic mutations in genes that have not been tested or identified to increase cancer risk.  Therefore, it is recommended she continue to follow the cancer management and screening guidelines provided by her oncology and primary healthcare provider.   An individual's cancer risk and medical management are not determined by genetic test results alone. Overall cancer risk assessment incorporates additional factors, including personal medical history, family history, and any available genetic information that  may result in a personalized plan for cancer prevention and surveillance  RECOMMENDATIONS FOR FAMILY MEMBERS:  Individuals in this family might be at some increased risk of developing cancer, over the general population risk, simply due to the family history of cancer.  We recommended women in this family have a yearly mammogram beginning at age 101, or 35 years younger than the earliest onset of cancer, an annual clinical breast exam, and perform monthly breast self-exams. Women in this family should also have a gynecological exam as recommended by their primary provider. All family members should be referred for colonoscopy starting at age 79.  FOLLOW-UP: Lastly, we discussed with Ms. Appling that cancer genetics is a rapidly advancing field and it is possible that new genetic tests will be appropriate for her and/or her family members in the future. We encouraged her to remain in contact with cancer genetics on an annual basis so we can update her personal and family histories and let her know of advances in cancer genetics that may benefit this family.   Our contact number was provided. Ms. Lesesne questions were answered to her satisfaction, and she knows she is welcome to call us at anytime with additional questions or concerns.   Roma Kayser, [place], [facility] Licensed, Certified Genetic Counselor [email or web address]

## 2020-04-02 ENCOUNTER — Other Ambulatory Visit: Payer: Self-pay | Admitting: *Deleted

## 2020-04-02 ENCOUNTER — Other Ambulatory Visit: Payer: Self-pay | Admitting: Surgery

## 2020-04-02 DIAGNOSIS — Z853 Personal history of malignant neoplasm of breast: Secondary | ICD-10-CM

## 2020-04-02 DIAGNOSIS — C50412 Malignant neoplasm of upper-outer quadrant of left female breast: Secondary | ICD-10-CM

## 2020-04-02 DIAGNOSIS — Z170 Estrogen receptor positive status [ER+]: Secondary | ICD-10-CM

## 2020-04-05 ENCOUNTER — Encounter: Payer: Self-pay | Admitting: *Deleted

## 2020-04-12 ENCOUNTER — Encounter: Payer: Self-pay | Admitting: Licensed Clinical Social Worker

## 2020-04-12 NOTE — Progress Notes (Signed)
[facility] Clinical Social Work  Pt called to inquire about Knitted Knockers. CSW explained what they are and mailed two to patient.  CSW also discussed services available through support services, including resources, support groups, and other programs. Patient expressed no financial needs and that she is not ready to take part in any programs at this time, but will keep them in mind.   Christeen Douglas, LCSW

## 2020-04-13 ENCOUNTER — Ambulatory Visit: Payer: Medicare PPO | Attending: Surgery | Admitting: Physical Therapy

## 2020-04-13 ENCOUNTER — Other Ambulatory Visit: Payer: Self-pay

## 2020-04-13 ENCOUNTER — Encounter: Payer: Self-pay | Admitting: Physical Therapy

## 2020-04-13 DIAGNOSIS — Z170 Estrogen receptor positive status [ER+]: Secondary | ICD-10-CM | POA: Insufficient documentation

## 2020-04-13 DIAGNOSIS — R293 Abnormal posture: Secondary | ICD-10-CM | POA: Diagnosis not present

## 2020-04-13 DIAGNOSIS — C50412 Malignant neoplasm of upper-outer quadrant of left female breast: Secondary | ICD-10-CM | POA: Insufficient documentation

## 2020-04-13 NOTE — Therapy (Signed)
[place], [place], [address] Phone: [phone]   Fax:  [phone]  Physical Therapy Evaluation  Patient Details  Name: Elizabeth Maldonado MRN: [id number] Date of Birth: 04/29/1945 Referring Provider (PT): Ninfa Linden   Encounter Date: 04/13/2020   PT End of Session - 04/13/20 1537    Visit Number 1    Number of Visits 2    Date for PT Re-Evaluation 05/11/20    PT Start Time 1503    PT Stop Time 1530    PT Time Calculation (min) 27 min    Activity Tolerance Patient tolerated treatment well    Behavior During Therapy [facility] for tasks assessed/performed           Past Medical History:  Diagnosis Date  . Breast cancer ([place]) 02/12/2020  . Diverticulosis   . Elevated blood pressure reading    165/71 a pre-op appt, reports she has seen her primary care in the last few months and intheir office it was in the 616W systolic, was not started on meds at that time, says her bp has "been creeping up since all this stuff started" , denies acute cardiac symptoms   . Family history of breast cancer   . Family history of colon cancer   . Family history of melanoma   . Hearing loss    wears hearing aids  . History of anemia as a child   . Hypercholesteremia    diet controlled  . Hyperlipidemia 02/16/2016  . Hypothyroid   . Hypothyroidism 02/16/2016    Past Surgical History:  Procedure Laterality Date  . APPENDECTOMY    . BUNIONECTOMY    . CATARACT EXTRACTION    . CESAREAN SECTION    . CHOLECYSTECTOMY    . FOOT SURGERY Right 2011   bone graft   . HYSTEROSCOPY WITH D & C N/A 06/11/2018   Procedure: DILATATION AND CURETTAGE /HYSTEROSCOPY WITH MYOSURE POLYPECTOMY;  Surgeon: Lavonia Drafts, MD;  Location: [facility];  Service: Gynecology;  Laterality: N/A;  . NASAL SEPTUM SURGERY     at age 70  . ROBOTIC ASSISTED TOTAL HYSTERECTOMY WITH BILATERAL SALPINGO OOPHERECTOMY Bilateral 07/17/2018   Procedure: XI ROBOTIC  ASSISTED TOTAL HYSTERECTOMY WITH BILATERAL SALPINGO OOPHORECTOMY AND LEFT PELVIC LYMPADECTOMY;  Surgeon: Everitt Amber, MD;  Location: [facility];  Service: Gynecology;  Laterality: Bilateral;  . SENTINEL NODE BIOPSY N/A 07/17/2018   Procedure: SENTINEL NODE BIOPSY;  Surgeon: Everitt Amber, MD;  Location: [facility];  Service: Gynecology;  Laterality: N/A;  . TONSILLECTOMY      There were no vitals filed for this visit.    Subjective Assessment - 04/13/20 1503    Subjective I don't have any issues with my shoulders. My surgery is on Monday.    Pertinent History L breast cancer, ER+, plan is to undergo a L mastectomy and SLNB    Patient Stated Goals to gain info from provider    Currently in Pain? No/denies    Multiple Pain Sites No              [facility] PT Assessment - 04/13/20 0001      Assessment   Medical Diagnosis L breast cancer    Referring Provider (PT) Ninfa Linden    Onset Date/Surgical Date 04/19/20    Hand Dominance Left    Prior Therapy 2015 PT for back      Precautions   Precautions Other (comment)    Precaution Comments active cancer  Balance Screen   Has the patient fallen in the past 6 months No    Has the patient had a decrease in activity level because of a fear of falling?  Yes   does not climb ladders anymore   Is the patient reluctant to leave their home because of a fear of falling?  No      Home Ecologist residence    Living Arrangements Spouse/significant other    Available Help at Discharge Family    Type of [place]      Prior Function   Level of [place] Retired    Leisure pt does not exercise - does take dog out daily for 30 min      Cognition   Overall Cognitive Status Within Functional Limits for tasks assessed      Posture/Postural Control   Posture/Postural Control Postural limitations    Postural Limitations Rounded Shoulders;Forward head      AROM   Right Shoulder Extension 72  Degrees    Right Shoulder Flexion 152 Degrees    Right Shoulder ABduction 158 Degrees    Right Shoulder Internal Rotation 72 Degrees    Right Shoulder External Rotation 75 Degrees    Left Shoulder Extension 67 Degrees    Left Shoulder Flexion 155 Degrees    Left Shoulder ABduction 168 Degrees    Left Shoulder Internal Rotation 70 Degrees    Left Shoulder External Rotation 66 Degrees              L-DEX FLOWSHEETS - 04/13/20 1500      L-DEX LYMPHEDEMA SCREENING   Measurement Type Unilateral    L-DEX MEASUREMENT EXTREMITY Upper Extremity    POSITION  Standing    DOMINANT SIDE Left    At Risk Side Left    BASELINE SCORE (UNILATERAL) -3.9          The patient was assessed using the L-Dex machine today to produce a lymphedema index baseline score. The patient will be reassessed on a regular basis (typically every 3 months) to obtain new L-Dex scores. If the score is > 6.5 points away from his/her baseline score indicating onset of subclinical lymphedema, it will be recommended to wear a compression garment for 4 weeks, 12 hours per day and then be reassessed. If the score continues to be > 6.5 points from baseline at reassessment, we will initiate lymphedema treatment. Assessing in this manner has a 95% rate of preventing clinically significant lymphedema.       Katina Dung - 04/13/20 0001    Open a tight or new jar Mild difficulty    Do heavy household chores (wash walls, wash floors) No difficulty    Carry a shopping bag or briefcase No difficulty    Wash your back No difficulty    Use a knife to cut food No difficulty    Recreational activities in which you take some force or impact through your arm, shoulder, or hand (golf, hammering, tennis) No difficulty    During the past week, to what extent has your arm, shoulder or hand problem interfered with your normal social activities with family, friends, neighbors, or groups? Not at all    During the past week, to what extent has  your arm, shoulder or hand problem limited your work or other regular daily activities Not at all    Arm, shoulder, or hand pain. None    Tingling (pins and needles) in  your arm, shoulder, or hand None    Difficulty Sleeping No difficulty    DASH Score 2.27 %            Objective measurements completed on examination: See above findings.                    PT Long Term Goals - 04/13/20 1541      PT LONG TERM GOAL #1   Title Pt will return to baseline shoulder ROM and not demonstrate any signs or symptoms of lymphedema.    Time 4    Period Weeks    Status New    Target Date 05/11/20           Breast Clinic Goals - 04/13/20 1541      Patient will be able to verbalize understanding of pertinent lymphedema risk reduction practices relevant to her diagnosis specifically related to skin care.   Time 1    Period Days    Status Achieved      Patient will be able to return demonstrate and/or verbalize understanding of the post-op home exercise program related to regaining shoulder range of motion.   Time 1    Period Days    Status Achieved      Patient will be able to verbalize understanding of the importance of attending the postoperative After Breast Cancer Class for further lymphedema risk reduction education and therapeutic exercise.   Time 1    Period Days    Status Achieved                 Plan - 04/13/20 1538    Clinical Impression Statement Pt reports to PT with newly diagnosed L breast cancer, ER+. She will be undergoing a L mastectomy and SLNB on 04/19/20. Plan is that pt will not require radiation. Pt's shoulder ROM is WFL. She is not having any shoulder pain.. Pt was instructed in post op exercises and educated that if she has a mastectomy then not to begin exercises until a week after the last drain is removed. Baseline ROM and SOZO measurements taken today. She will benefit from a post op PT reassessment to determine needs and in every 3 months  for additional L dex screening to detect subclinical lymphedema.    Stability/Clinical Decision Making Stable/Uncomplicated    Clinical Decision Making Low    Rehab Potential Good    PT Frequency --   eval and 1 f/u visit   PT Duration 4 weeks    PT Treatment/Interventions ADLs/Self Care Home Management;Patient/family education;Therapeutic exercise    PT Next Visit Plan reassess, sign pt up for ABC class, take circumferential measurements    PT Home Exercise Plan post op breast exercises    Consulted and Agree with Plan of Care Patient           Patient will benefit from skilled therapeutic intervention in order to improve the following deficits and impairments:  Postural dysfunction,Decreased knowledge of precautions  Visit Diagnosis: Abnormal posture  Malignant neoplasm of upper-outer quadrant of left breast in female, estrogen receptor positive ([place])   Patient will follow up at outpatient cancer rehab 3-4 weeks following surgery.  If the patient requires physical therapy at that time, a specific plan will be dictated and sent to the referring physician for approval. The patient was educated today on appropriate basic range of motion exercises to begin post operatively and the importance of attending the After Breast Cancer class following surgery.  Patient  was educated today on lymphedema risk reduction practices as it pertains to recommendations that will benefit the patient immediately following surgery.  She verbalized good understanding.     Problem List Patient Active Problem List   Diagnosis Date Noted  . Genetic testing 03/29/2020  . Family history of breast cancer   . Family history of melanoma   . Family history of colon cancer   . Malignant neoplasm of upper-outer quadrant of left breast in female, estrogen receptor positive ([place]) 03/09/2020  . Family history of coronary artery disease 03/06/2019  . Endometrial cancer ([place]) 07/11/2018  . Obesity (BMI 35.0-39.9  without comorbidity) 07/11/2018  . Endometrial polyp 06/11/2018  . Post-menopausal bleeding 06/06/2018  . Hyperlipidemia 02/16/2016  . Hypothyroidism 02/16/2016  . Plantar fasciitis of right foot 12/16/2015  . Metatarsal deformity, right 12/16/2015  . Pronation deformity of ankle, acquired 12/16/2015    Allyson Sabal [facility] 04/13/2020, 3:41 PM  [place], [place], [address] Phone: [phone]   Fax:  [phone]  Name: TRICIA PLEDGER MRN: [id number] Date of Birth: Mar 10, 1945  Allyson Sabal [place], PT 04/13/20 3:42 PM

## 2020-04-13 NOTE — Progress Notes (Signed)
Surgical Instructions    Your procedure is scheduled on 04/19/2020  Report to [facility] Main Entrance "A" at 09:45 A.M., then check in with the Admitting office.  Call this number if you have problems the morning of surgery:  [phone]   If you have any questions prior to your surgery date call [phone]: Open Monday-Friday 8am-4pm    Remember:  Do not eat after midnight the night before your surgery  You may drink clear liquids until 8:45 am the morning of your surgery.   Clear liquids allowed are: Water, Non-Citrus Juices (without pulp), Carbonated Beverages, Clear Tea, Black Coffee Only, and Gatorade  Patient Instructions  . The night before surgery:  o No food after midnight. ONLY clear liquids after midnight  . The day of surgery (if you do NOT have diabetes):  o Drink ONE (1) Pre-Surgery Clear Ensure by 0845 am the morning of surgery. Drink in one sitting. Do not sip.  o This drink was given to you during your hospital  pre-op appointment visit.  Nothing else to drink after completing the  Pre-Surgery Clear Ensure.          If you have questions, please contact your surgeon's office.     Take these medicines the morning of surgery with A SIP OF WATER   levothyroxine (SYNTHROID, LEVOTHROID  As of today, STOP taking any Aspirin (unless otherwise instructed by your surgeon) Aleve, Naproxen, Ibuprofen, Motrin, Advil, Goody's, BC's, all herbal medications, fish oil, and all vitamins.                     Do not wear jewelry, make up, or nail polish            Do not wear lotions, powders, perfumes/colognes, or deodorant.            Do not shave 48 hours prior to surgery.  Men may shave face and neck.            Do not bring valuables to the hospital.            [facility] is not responsible for any belongings or valuables.  Do NOT Smoke (Tobacco/Vaping) or drink Alcohol 24 hours prior to your procedure If you use a CPAP at night, you may bring all equipment for  your overnight stay.   Contacts, glasses, dentures or bridgework may not be worn into surgery, please bring cases for these belongings   For patients admitted to the hospital, discharge time will be determined by your treatment team.   Patients discharged the day of surgery will not be allowed to drive home, and someone needs to stay with them for 24 hours.    Special instructions:   [place]- Preparing For Surgery  Before surgery, you can play an important role. Because skin is not sterile, your skin needs to be as free of germs as possible. You can reduce the number of germs on your skin by washing with CHG (chlorahexidine gluconate) Soap before surgery.  CHG is an antiseptic cleaner which kills germs and bonds with the skin to continue killing germs even after washing.    Oral Hygiene is also important to reduce your risk of infection.  Remember - BRUSH YOUR TEETH THE MORNING OF SURGERY WITH YOUR REGULAR TOOTHPASTE  Please do not use if you have an allergy to CHG or antibacterial soaps. If your skin becomes reddened/irritated stop using the CHG.  Do not shave (including legs and underarms)  for at least 48 hours prior to first CHG shower. It is OK to shave your face.  Please follow these instructions carefully.   1. Shower the NIGHT BEFORE SURGERY and the MORNING OF SURGERY  2. If you chose to wash your hair, wash your hair first as usual with your normal shampoo.  3. After you shampoo, rinse your hair and body thoroughly to remove the shampoo.  4. Wash Face and genitals (private parts) with your normal soap.   5.  Shower the NIGHT BEFORE SURGERY and the MORNING OF SURGERY with CHG Soap.   6. Use CHG Soap as you would any other liquid soap. You can apply CHG directly to the skin and wash gently with a scrungie or a clean washcloth.   7. Apply the CHG Soap to your body ONLY FROM THE NECK DOWN.  Do not use on open wounds or open sores. Avoid contact with your eyes, ears, mouth  and genitals (private parts). Wash Face and genitals (private parts)  with your normal soap.   8. Wash thoroughly, paying special attention to the area where your surgery will be performed.  9. Thoroughly rinse your body with warm water from the neck down.  10. DO NOT shower/wash with your normal soap after using and rinsing off the CHG Soap.  11. Pat yourself dry with a CLEAN TOWEL.  12. Wear CLEAN PAJAMAS to bed the night before surgery  13. Place CLEAN SHEETS on your bed the night before your surgery  14. DO NOT SLEEP WITH PETS.   Day of Surgery: Take a shower with CHG soap. Wear Clean/Comfortable clothing the morning of surgery Do not apply any deodorants/lotions.   Remember to brush your teeth WITH YOUR REGULAR TOOTHPASTE.   Please read over the following fact sheets that you were given.

## 2020-04-14 ENCOUNTER — Encounter (HOSPITAL_COMMUNITY)
Admission: RE | Admit: 2020-04-14 | Discharge: 2020-04-14 | Disposition: A | Discharge status: Home / Self Care | Payer: Medicare PPO | Source: Ambulatory Visit | Attending: Surgery | Admitting: Surgery

## 2020-04-14 ENCOUNTER — Encounter (HOSPITAL_COMMUNITY): Payer: Self-pay

## 2020-04-14 ENCOUNTER — Other Ambulatory Visit: Payer: Self-pay

## 2020-04-14 DIAGNOSIS — Z01812 Encounter for preprocedural laboratory examination: Secondary | ICD-10-CM | POA: Diagnosis not present

## 2020-04-14 HISTORY — DX: Personal history of other diseases of the digestive system: Z87.19

## 2020-04-14 LAB — CBC
HCT: 42.1 % (ref 36.0–46.0)
Hemoglobin: 13.6 g/dL (ref 12.0–15.0)
MCH: 31.1 pg (ref 26.0–34.0)
MCHC: 32.3 g/dL (ref 30.0–36.0)
MCV: 96.1 fL (ref 80.0–100.0)
Platelets: 203 10*3/uL (ref 150–400)
RBC: 4.38 MIL/uL (ref 3.87–5.11)
RDW: 13.3 % (ref 11.5–15.5)
WBC: 4.3 10*3/uL (ref 4.0–10.5)
nRBC: 0 % (ref 0.0–0.2)

## 2020-04-14 NOTE — Progress Notes (Signed)
PCP - Alben Spittle Cardiologist - denies  PPM/ICD - denies   Chest x-ray - n/a EKG - 10/24/19 Stress Test - over 10 years ago (normal per pt)- does not remember doctor or place it was done at) ECHO - denies Cardiac Cath - denies  Sleep Study - denies    Patient instructed to hold all Aspirin, NSAID's, herbal medications, fish oil and vitamins 7 days prior to surgery.   ERAS Protcol -yes PRE-SURGERY Ensure or G2- ensure given  COVID TEST- 04/15/20   Anesthesia review: no  Patient denies shortness of breath, fever, cough and chest pain at PAT appointment   All instructions explained to the patient, with a verbal understanding of the material. Patient agrees to go over the instructions while at home for a better understanding. Patient also instructed to self quarantine after being tested for COVID-19. The opportunity to ask questions was provided.

## 2020-04-15 ENCOUNTER — Other Ambulatory Visit (HOSPITAL_COMMUNITY)
Admission: RE | Admit: 2020-04-15 | Discharge: 2020-04-15 | Disposition: A | Discharge status: Home / Self Care | Payer: Medicare PPO | Source: Ambulatory Visit | Attending: Surgery | Admitting: Surgery

## 2020-04-15 DIAGNOSIS — Z01812 Encounter for preprocedural laboratory examination: Secondary | ICD-10-CM | POA: Diagnosis not present

## 2020-04-15 DIAGNOSIS — Z20822 Contact with and (suspected) exposure to covid-19: Secondary | ICD-10-CM | POA: Insufficient documentation

## 2020-04-15 LAB — SARS CORONAVIRUS 2 (TAT 6-24 HRS): SARS Coronavirus 2: NEGATIVE

## 2020-04-18 NOTE — H&P (Signed)
Elizabeth Maldonado  Location: [facility] Patient #: [id number] DOB: Apr 22, 1945 Married / Language: English / Race: White Female   History of Present Illness  The patient is a 75 year old female who presents with breast cancer.  Chief complaint: Left breast cancer and left breast papilloma  This is a 75 year old female who is status came back free to have several abnormal areas in the left breast. There were 2 areas at 3 o'clock position right adjacent to each other for biopsy showing invasive ductal carcinoma. There was one other area that was retroareolar and left breast with calcifications which was biopsied showing a papilloma with some atypia. The area of the 3 o'clock position measured 1.7 cm in total size. This showed an invasive ductal carcinoma which was 95% ER and PR positive, HER-2 negative, and a KI 67 of 5%. She has a previous history of uterine cancer is had a hysterectomy in the past. There is no family history of breast cancer but there are other multiple malignancies in the family including prostate cancer and lymphoma. She has no cardiopulmonary issues. She has had no predisposing anesthesia. She denies nipple discharge.   Past Surgical History  Appendectomy  Breast Biopsy  Left. Cataract Surgery  Bilateral. Cesarean Section - 1  Colon Polyp Removal - Colonoscopy  Foot Surgery  Bilateral. Gallbladder Surgery - Laparoscopic  Hysterectomy (due to cancer) - Complete  Oral Surgery  Tonsillectomy   Diagnostic Studies History Colonoscopy  1-5 years ago Mammogram  1-3 years ago  Allergies  Statins   Medication History  Levothyroxine Sodium (75MCG Tablet, Oral) Active. Repatha SureClick (213YQ/MV Soln Auto-inj, Subcutaneous) Active. Medications Reconciled  Social History  Caffeine use  Coffee. No alcohol use  No drug use  Tobacco use  Never smoker.  Family History Anesthetic complications  Sister. Arthritis  Mother,  Sister. Cancer  Brother. Cerebrovascular Accident  Sister. Cervical Cancer  Sister. Colon Cancer  Family Members In General. Diabetes Mellitus  Brother, Mother, Sister. Heart Disease  Brother, Father, Mother. Melanoma  Brother, Father. Prostate Cancer  Brother. Respiratory Condition  Father, Sister. Thyroid problems  Mother.  Pregnancy / Birth History  Age at menarche  53 years. Age of menopause  <47 Gravida  1 Maternal age  14-40 Para  1 Regular periods   Other Problems Anxiety Disorder  Back Pain  Breast Cancer  Cancer  Cholelithiasis  Gastroesophageal Reflux Disease  Hemorrhoids  Hypercholesterolemia  Oophorectomy  Bilateral. Thyroid Disease     Review of Systems  General Not Present- Appetite Loss, Chills, Fatigue, Fever, Night Sweats, Weight Gain and Weight Loss. Skin Not Present- Change in Wart/Mole, Dryness, Hives, Jaundice, New Lesions, Non-Healing Wounds, Rash and Ulcer. HEENT Present- Seasonal Allergies and Wears glasses/contact lenses. Not Present- Earache, Hearing Loss, Hoarseness, Nose Bleed, Oral Ulcers, Ringing in the Ears, Sinus Pain, Sore Throat, Visual Disturbances and Yellow Eyes. Respiratory Not Present- Bloody sputum, Chronic Cough, Difficulty Breathing, Snoring and Wheezing. Breast Present- Breast Pain. Not Present- Breast Mass, Nipple Discharge and Skin Changes. Cardiovascular Not Present- Chest Pain, Difficulty Breathing Lying Down, Leg Cramps, Palpitations, Rapid Heart Rate, Shortness of Breath and Swelling of Extremities. Gastrointestinal Present- Hemorrhoids and Indigestion. Not Present- Abdominal Pain, Bloating, Bloody Stool, Change in Bowel Habits, Chronic diarrhea, Constipation, Difficulty Swallowing, Excessive gas, Gets full quickly at meals, Nausea, Rectal Pain and Vomiting. Female Genitourinary Not Present- Frequency, Nocturia, Painful Urination, Pelvic Pain and Urgency. Musculoskeletal Present- Back Pain and  Swelling of Extremities. Not Present- Joint Pain,  Joint Stiffness, Muscle Pain and Muscle Weakness. Neurological Not Present- Decreased Memory, Fainting, Headaches, Numbness, Seizures, Tingling, Tremor, Trouble walking and Weakness. Psychiatric Present- Anxiety. Not Present- Bipolar, Change in Sleep Pattern, Depression, Fearful and Frequent crying. Endocrine Not Present- Cold Intolerance, Excessive Hunger, Hair Changes, Heat Intolerance, Hot flashes and New Diabetes. Hematology Not Present- Blood Thinners, Easy Bruising, Excessive bleeding, Gland problems, HIV and Persistent Infections.  Vitals   Weight: 202.6 lb Height: 62in Body Surface Area: 1.92 m Body Mass Index: 37.06 kg/m  BP: 142/86(Sitting, Left Arm, Standard)    Physical Exam  The physical exam findings are as follows: Note: She appears well in exam.  She has large breasts bilaterally.  There are no palpable breast masses and no axillary adenopathy. There is some ecchymosis and tenderness from the left breast biopsy.    Assessment & Plan   BREAST CANCER, LEFT (C50.912) PAPILLOMA OF LEFT BREAST (D24.2)  Impression: I have reviewed her notes and electronic medical records. I have reviewed her mammograms and ultrasound. I reviewed her pathology results. Again she has an invasive ductal carcinoma with 2 areas that are adjacent to each other measuring 1.7 cm in the left breast. She also has a retroareolar papilloma. I discussed breast cancer with her husband in detail. I gave her a copy of the pathology results. We then discussed breast conservation versus mastectomy. She is considering breast conservation. We next discussed proceeding with a radioactive seed guided left breast lumpectomy 2 to remove the area of cancer as well as the papilloma. I discussed the surgical procedure in detail. We discussed the risk of surgery which includes but is not limited to bleeding, infection, the need for further surgery if margins are  positive, cardiopulmonary issues, postoperative recovery, etc. She will be also referred to medical and radiation oncology as well as genetics. Surgery will be scheduled as soon as possible.  Addendum: The patient had a third biopsy of the left breast showing but invasive and in situ ductal carcinoma.  She has since had an MRI showing the area of non mass enhancement between 2 biopsy sites is at least 4.7 cm.  There is further clumped non mass enhancement throughout the left breast.  Radiology recommended 3 more biopsies on the left if breast conservation is still desired.  After a discussion with the patient, she wishes to proceed with a left mastectomy and sentinel node biopsy without reconstruction.  We discussed that reconstruction could still be a possibility in the future. We discussed the procedure and the risks which includes, but is not limited to bleeding, infection, injury to surrounding structures, the need for drains, the need for further procedures, DVT, cardiopulmonary issues, etc.

## 2020-04-19 ENCOUNTER — Ambulatory Visit (HOSPITAL_COMMUNITY): Payer: Medicare PPO

## 2020-04-19 ENCOUNTER — Other Ambulatory Visit: Payer: Self-pay

## 2020-04-19 ENCOUNTER — Encounter (HOSPITAL_COMMUNITY): Payer: Medicare PPO

## 2020-04-19 ENCOUNTER — Encounter (HOSPITAL_COMMUNITY)
Admission: RE | Disposition: A | Discharge status: Home / Self Care | Payer: Self-pay | Source: Home / Self Care | Attending: Surgery

## 2020-04-19 ENCOUNTER — Observation Stay (HOSPITAL_COMMUNITY)
Admission: RE | Admit: 2020-04-19 | Discharge: 2020-04-20 | Disposition: A | Discharge status: Home / Self Care | Payer: Medicare PPO | Attending: Surgery | Admitting: Surgery

## 2020-04-19 ENCOUNTER — Ambulatory Visit (HOSPITAL_COMMUNITY)
Admission: RE | Admit: 2020-04-19 | Discharge: 2020-04-19 | Disposition: A | Discharge status: Home / Self Care | Payer: Medicare PPO | Source: Ambulatory Visit | Attending: Surgery | Admitting: Surgery

## 2020-04-19 ENCOUNTER — Encounter (HOSPITAL_COMMUNITY): Payer: Self-pay | Admitting: Surgery

## 2020-04-19 DIAGNOSIS — C50412 Malignant neoplasm of upper-outer quadrant of left female breast: Secondary | ICD-10-CM | POA: Diagnosis not present

## 2020-04-19 DIAGNOSIS — Z170 Estrogen receptor positive status [ER+]: Secondary | ICD-10-CM | POA: Insufficient documentation

## 2020-04-19 DIAGNOSIS — Z79899 Other long term (current) drug therapy: Secondary | ICD-10-CM | POA: Diagnosis not present

## 2020-04-19 DIAGNOSIS — G8918 Other acute postprocedural pain: Secondary | ICD-10-CM | POA: Diagnosis not present

## 2020-04-19 DIAGNOSIS — F419 Anxiety disorder, unspecified: Secondary | ICD-10-CM | POA: Insufficient documentation

## 2020-04-19 DIAGNOSIS — D0512 Intraductal carcinoma in situ of left breast: Principal | ICD-10-CM | POA: Insufficient documentation

## 2020-04-19 DIAGNOSIS — Z853 Personal history of malignant neoplasm of breast: Secondary | ICD-10-CM

## 2020-04-19 DIAGNOSIS — N840 Polyp of corpus uteri: Secondary | ICD-10-CM | POA: Diagnosis not present

## 2020-04-19 DIAGNOSIS — E7800 Pure hypercholesterolemia, unspecified: Secondary | ICD-10-CM | POA: Diagnosis not present

## 2020-04-19 DIAGNOSIS — C50912 Malignant neoplasm of unspecified site of left female breast: Secondary | ICD-10-CM | POA: Diagnosis not present

## 2020-04-19 DIAGNOSIS — Z8541 Personal history of malignant neoplasm of cervix uteri: Secondary | ICD-10-CM | POA: Diagnosis not present

## 2020-04-19 DIAGNOSIS — D242 Benign neoplasm of left breast: Secondary | ICD-10-CM | POA: Insufficient documentation

## 2020-04-19 HISTORY — PX: MASTECTOMY W/ SENTINEL NODE BIOPSY: SHX2001

## 2020-04-19 SURGERY — MASTECTOMY WITH SENTINEL LYMPH NODE BIOPSY
Anesthesia: General | Site: Breast | Laterality: Left

## 2020-04-19 MED ORDER — DIPHENHYDRAMINE HCL 12.5 MG/5ML PO ELIX: 12.5000 mg | ORAL_SOLUTION | Freq: Four times a day (QID) | ORAL | Status: DC | PRN

## 2020-04-19 MED ORDER — DIPHENHYDRAMINE HCL 50 MG/ML IJ SOLN: 12.5000 mg | Freq: Four times a day (QID) | INTRAMUSCULAR | Status: DC | PRN

## 2020-04-19 MED ORDER — FENTANYL CITRATE (PF) 100 MCG/2ML IJ SOLN
INTRAMUSCULAR | Status: AC
  Filled 2020-04-19: qty 2

## 2020-04-19 MED ORDER — MIDAZOLAM HCL 2 MG/2ML IJ SOLN
INTRAMUSCULAR | Status: AC
  Administered 2020-04-19: 1 mg via INTRAVENOUS
  Filled 2020-04-19: qty 2

## 2020-04-19 MED ORDER — SODIUM CHLORIDE 0.9 % IV SOLN: INTRAVENOUS | Status: DC

## 2020-04-19 MED ORDER — TRAMADOL HCL 50 MG PO TABS
50.0000 mg | ORAL_TABLET | Freq: Four times a day (QID) | ORAL | Status: DC | PRN
Start: 2020-04-19 — End: 2020-04-20

## 2020-04-19 MED ORDER — CHLORHEXIDINE GLUCONATE CLOTH 2 % EX PADS: 6.0000 | MEDICATED_PAD | Freq: Once | CUTANEOUS | Status: DC

## 2020-04-19 MED ORDER — LACTATED RINGERS IV SOLN: INTRAVENOUS | Status: DC

## 2020-04-19 MED ORDER — FENTANYL CITRATE (PF) 250 MCG/5ML IJ SOLN
INTRAMUSCULAR | Status: AC
  Filled 2020-04-19: qty 5

## 2020-04-19 MED ORDER — ROCURONIUM BROMIDE 10 MG/ML (PF) SYRINGE
PREFILLED_SYRINGE | INTRAVENOUS | Status: AC
  Filled 2020-04-19: qty 10

## 2020-04-19 MED ORDER — KETOROLAC TROMETHAMINE 30 MG/ML IJ SOLN
INTRAMUSCULAR | Status: AC
  Filled 2020-04-19: qty 1

## 2020-04-19 MED ORDER — PROPOFOL 10 MG/ML IV BOLUS
INTRAVENOUS | Status: DC | PRN
  Administered 2020-04-19: 200 mg via INTRAVENOUS

## 2020-04-19 MED ORDER — CLONIDINE HCL (ANALGESIA) 100 MCG/ML EP SOLN
EPIDURAL | Status: DC | PRN
  Administered 2020-04-19: 50 ug

## 2020-04-19 MED ORDER — OXYCODONE HCL 5 MG PO TABS
5.0000 mg | ORAL_TABLET | ORAL | Status: DC | PRN
  Administered 2020-04-19: 5 mg via ORAL
  Administered 2020-04-19 (×2): 10 mg via ORAL
  Administered 2020-04-20: 5 mg via ORAL
  Filled 2020-04-19: qty 1
  Filled 2020-04-19: qty 2
  Filled 2020-04-19: qty 1

## 2020-04-19 MED ORDER — LEVOTHYROXINE SODIUM 75 MCG PO TABS
75.0000 ug | ORAL_TABLET | Freq: Every day | ORAL | Status: DC
  Administered 2020-04-20: 75 ug via ORAL
  Filled 2020-04-19: qty 1

## 2020-04-19 MED ORDER — OXYCODONE HCL 5 MG PO TABS
ORAL_TABLET | ORAL | Status: AC
  Filled 2020-04-19: qty 2

## 2020-04-19 MED ORDER — FENTANYL CITRATE (PF) 100 MCG/2ML IJ SOLN
50.0000 ug | Freq: Once | INTRAMUSCULAR | Status: AC
Start: 2020-04-19 — End: 2020-04-19

## 2020-04-19 MED ORDER — AMISULPRIDE (ANTIEMETIC) 5 MG/2ML IV SOLN: 10.0000 mg | Freq: Once | INTRAVENOUS | Status: DC | PRN

## 2020-04-19 MED ORDER — SODIUM CHLORIDE (PF) 0.9 % IJ SOLN
INTRAMUSCULAR | Status: AC
  Filled 2020-04-19: qty 10

## 2020-04-19 MED ORDER — CEFAZOLIN SODIUM-DEXTROSE 2-4 GM/100ML-% IV SOLN
INTRAVENOUS | Status: AC
  Filled 2020-04-19: qty 100

## 2020-04-19 MED ORDER — DEXAMETHASONE SODIUM PHOSPHATE 10 MG/ML IJ SOLN
INTRAMUSCULAR | Status: AC
  Filled 2020-04-19: qty 1

## 2020-04-19 MED ORDER — HYDROMORPHONE HCL 1 MG/ML IJ SOLN
1.0000 mg | INTRAMUSCULAR | Status: DC | PRN
Start: 2020-04-19 — End: 2020-04-20

## 2020-04-19 MED ORDER — MIDAZOLAM HCL 2 MG/2ML IJ SOLN: 1.0000 mg | Freq: Once | INTRAMUSCULAR | Status: AC

## 2020-04-19 MED ORDER — CHLORHEXIDINE GLUCONATE 0.12 % MT SOLN
15.0000 mL | Freq: Once | OROMUCOSAL | Status: DC
  Filled 2020-04-19: qty 15

## 2020-04-19 MED ORDER — CHLORHEXIDINE GLUCONATE 0.12 % MT SOLN
15.0000 mL | Freq: Once | OROMUCOSAL | Status: AC
  Administered 2020-04-19: 15 mL via OROMUCOSAL

## 2020-04-19 MED ORDER — LIDOCAINE 2% (20 MG/ML) 5 ML SYRINGE
INTRAMUSCULAR | Status: AC
  Filled 2020-04-19: qty 5

## 2020-04-19 MED ORDER — ORAL CARE MOUTH RINSE: 15.0000 mL | Freq: Once | OROMUCOSAL | Status: AC

## 2020-04-19 MED ORDER — ENOXAPARIN SODIUM 40 MG/0.4ML ~~LOC~~ SOLN
40.0000 mg | SUBCUTANEOUS | Status: DC
  Administered 2020-04-20: 40 mg via SUBCUTANEOUS
  Filled 2020-04-19: qty 0.4

## 2020-04-19 MED ORDER — FENTANYL CITRATE (PF) 100 MCG/2ML IJ SOLN
25.0000 ug | INTRAMUSCULAR | Status: DC | PRN
  Administered 2020-04-19 (×3): 50 ug via INTRAVENOUS

## 2020-04-19 MED ORDER — COQ10 200 MG PO CAPS: 200.0000 mg | ORAL_CAPSULE | Freq: Every day | ORAL | Status: DC

## 2020-04-19 MED ORDER — FENTANYL CITRATE (PF) 100 MCG/2ML IJ SOLN
INTRAMUSCULAR | Status: AC
  Administered 2020-04-19: 50 ug via INTRAVENOUS
  Filled 2020-04-19: qty 2

## 2020-04-19 MED ORDER — METHYLENE BLUE 0.5 % INJ SOLN
INTRAVENOUS | Status: AC
  Filled 2020-04-19: qty 10

## 2020-04-19 MED ORDER — LIDOCAINE 2% (20 MG/ML) 5 ML SYRINGE
INTRAMUSCULAR | Status: DC | PRN
  Administered 2020-04-19: 20 mg via INTRAVENOUS

## 2020-04-19 MED ORDER — ONDANSETRON 4 MG PO TBDP: 4.0000 mg | ORAL_TABLET | Freq: Four times a day (QID) | ORAL | Status: DC | PRN

## 2020-04-19 MED ORDER — KETOROLAC TROMETHAMINE 30 MG/ML IJ SOLN
15.0000 mg | Freq: Once | INTRAMUSCULAR | Status: AC
  Administered 2020-04-19: 15 mg via INTRAVENOUS

## 2020-04-19 MED ORDER — ORAL CARE MOUTH RINSE: 15.0000 mL | Freq: Once | OROMUCOSAL | Status: DC

## 2020-04-19 MED ORDER — TECHNETIUM TC 99M TILMANOCEPT KIT
1.0000 | PACK | Freq: Once | INTRAVENOUS | Status: AC | PRN
  Administered 2020-04-19: 1 via INTRADERMAL

## 2020-04-19 MED ORDER — BUPIVACAINE-EPINEPHRINE (PF) 0.25% -1:200000 IJ SOLN
INTRAMUSCULAR | Status: DC | PRN
  Administered 2020-04-19: 30 mL

## 2020-04-19 MED ORDER — ONDANSETRON HCL 4 MG/2ML IJ SOLN
INTRAMUSCULAR | Status: AC
  Filled 2020-04-19: qty 2

## 2020-04-19 MED ORDER — DEXAMETHASONE SODIUM PHOSPHATE 10 MG/ML IJ SOLN
INTRAMUSCULAR | Status: DC | PRN
  Administered 2020-04-19: 10 mg via INTRAVENOUS

## 2020-04-19 MED ORDER — ENSURE PRE-SURGERY PO LIQD: 296.0000 mL | Freq: Once | ORAL | Status: DC

## 2020-04-19 MED ORDER — FENTANYL CITRATE (PF) 100 MCG/2ML IJ SOLN
INTRAMUSCULAR | Status: DC | PRN
  Administered 2020-04-19 (×2): 50 ug via INTRAVENOUS

## 2020-04-19 MED ORDER — PROPOFOL 10 MG/ML IV BOLUS
INTRAVENOUS | Status: AC
  Filled 2020-04-19: qty 20

## 2020-04-19 MED ORDER — ONDANSETRON HCL 4 MG/2ML IJ SOLN
INTRAMUSCULAR | Status: DC | PRN
  Administered 2020-04-19: 4 mg via INTRAVENOUS

## 2020-04-19 MED ORDER — ONDANSETRON HCL 4 MG/2ML IJ SOLN: 4.0000 mg | Freq: Four times a day (QID) | INTRAMUSCULAR | Status: DC | PRN

## 2020-04-19 MED ORDER — CEFAZOLIN SODIUM-DEXTROSE 2-4 GM/100ML-% IV SOLN
2.0000 g | INTRAVENOUS | Status: AC
  Administered 2020-04-19: 2 g via INTRAVENOUS

## 2020-04-19 MED ORDER — 0.9 % SODIUM CHLORIDE (POUR BTL) OPTIME
TOPICAL | Status: DC | PRN
  Administered 2020-04-19: 1000 mL

## 2020-04-19 SURGICAL SUPPLY — 47 items
APPLIER CLIP 9.375 MED OPEN (MISCELLANEOUS) ×2
BINDER BREAST XLRG (GAUZE/BANDAGES/DRESSINGS) ×2 IMPLANT
BIOPATCH RED 1 DISK 7.0 (GAUZE/BANDAGES/DRESSINGS) ×2 IMPLANT
CANISTER SUCT 3000ML PPV (MISCELLANEOUS) ×2 IMPLANT
CHLORAPREP W/TINT 26 (MISCELLANEOUS) ×2 IMPLANT
CLIP APPLIE 9.375 MED OPEN (MISCELLANEOUS) ×1 IMPLANT
CNTNR URN SCR LID CUP LEK RST (MISCELLANEOUS) ×1 IMPLANT
CONT SPEC 4OZ STRL OR WHT (MISCELLANEOUS) ×2
COVER PROBE W GEL 5X96 (DRAPES) ×2 IMPLANT
COVER SURGICAL LIGHT HANDLE (MISCELLANEOUS) ×2 IMPLANT
COVER WAND RF STERILE (DRAPES) ×2 IMPLANT
DERMABOND ADVANCED (GAUZE/BANDAGES/DRESSINGS) ×1
DERMABOND ADVANCED .7 DNX12 (GAUZE/BANDAGES/DRESSINGS) ×1 IMPLANT
DRAIN CHANNEL 19F RND (DRAIN) ×2 IMPLANT
DRAPE CHEST BREAST 15X10 FENES (DRAPES) ×2 IMPLANT
DRAPE ORTHO SPLIT 77X108 STRL (DRAPES) ×4
DRAPE SURG ORHT 6 SPLT 77X108 (DRAPES) ×2 IMPLANT
DRSG PAD ABDOMINAL 8X10 ST (GAUZE/BANDAGES/DRESSINGS) ×2 IMPLANT
DRSG TEGADERM 4X4.75 (GAUZE/BANDAGES/DRESSINGS) ×2 IMPLANT
ELECT REM PT RETURN 9FT ADLT (ELECTROSURGICAL) ×2
ELECTRODE REM PT RTRN 9FT ADLT (ELECTROSURGICAL) ×1 IMPLANT
EVACUATOR SILICONE 100CC (DRAIN) ×2 IMPLANT
GAUZE SPONGE 4X4 12PLY STRL (GAUZE/BANDAGES/DRESSINGS) ×2 IMPLANT
GLOVE SURG SIGNA 7.5 PF LTX (GLOVE) ×2 IMPLANT
GOWN STRL REUS W/ TWL LRG LVL3 (GOWN DISPOSABLE) ×1 IMPLANT
GOWN STRL REUS W/ TWL XL LVL3 (GOWN DISPOSABLE) ×1 IMPLANT
GOWN STRL REUS W/TWL LRG LVL3 (GOWN DISPOSABLE) ×2
GOWN STRL REUS W/TWL XL LVL3 (GOWN DISPOSABLE) ×2
KIT BASIN OR (CUSTOM PROCEDURE TRAY) ×2 IMPLANT
KIT TURNOVER KIT B (KITS) ×2 IMPLANT
NEEDLE 18GX1X1/2 (RX/OR ONLY) (NEEDLE) IMPLANT
NEEDLE FILTER BLUNT 18X 1/2SAF (NEEDLE)
NEEDLE FILTER BLUNT 18X1 1/2 (NEEDLE) IMPLANT
NEEDLE HYPO 25GX1X1/2 BEV (NEEDLE) IMPLANT
NS IRRIG 1000ML POUR BTL (IV SOLUTION) ×2 IMPLANT
PACK GENERAL/GYN (CUSTOM PROCEDURE TRAY) ×2 IMPLANT
PAD ARMBOARD 7.5X6 YLW CONV (MISCELLANEOUS) ×2 IMPLANT
PENCIL SMOKE EVACUATOR (MISCELLANEOUS) ×2 IMPLANT
SPECIMEN JAR X LARGE (MISCELLANEOUS) ×2 IMPLANT
SPONGE LAP 18X18 RF (DISPOSABLE) ×2 IMPLANT
SUT ETHILON 2 0 FS 18 (SUTURE) ×2 IMPLANT
SUT MON AB 4-0 PC3 18 (SUTURE) ×2 IMPLANT
SUT SILK 2 0 SH (SUTURE) ×2 IMPLANT
SUT VIC AB 3-0 SH 18 (SUTURE) ×4 IMPLANT
SYR CONTROL 10ML LL (SYRINGE) IMPLANT
TOWEL GREEN STERILE (TOWEL DISPOSABLE) ×2 IMPLANT
TOWEL GREEN STERILE FF (TOWEL DISPOSABLE) ×2 IMPLANT

## 2020-04-19 NOTE — Interval H&P Note (Signed)
History and Physical Interval Note:no change in H and P  04/19/2020 11:18 AM  Elizabeth Maldonado  has presented today for surgery, with the diagnosis of LEFT BREAST CANCER.  The various methods of treatment have been discussed with the patient and family. After consideration of risks, benefits and other options for treatment, the patient has consented to  Procedure(s): LEFT MASTECTOMY WITH SENTINEL LYMPH NODE BIOPSY (Left) as a surgical intervention.  The patient's history has been reviewed, patient examined, no change in status, stable for surgery.  I have reviewed the patient's chart and labs.  Questions were answered to the patient's satisfaction.     Coralie Keens

## 2020-04-19 NOTE — Plan of Care (Signed)

## 2020-04-19 NOTE — Anesthesia Procedure Notes (Signed)
Procedure Name: LMA Insertion Date/Time: 04/19/2020 11:56 AM Performed by: Trinna Post., CRNA Pre-anesthesia Checklist: Patient identified, Emergency Drugs available, Suction available, Patient being monitored and Timeout performed Patient Re-evaluated:Patient Re-evaluated prior to induction Oxygen Delivery Method: Circle system utilized Preoxygenation: Pre-oxygenation with 100% oxygen Induction Type: IV induction Ventilation: Mask ventilation without difficulty LMA: LMA inserted LMA Size: 4.0 Placement Confirmation: positive ETCO2 Dental Injury: Teeth and Oropharynx as per pre-operative assessment  Comments: Insertion by Jettie Pagan SRNA

## 2020-04-19 NOTE — Progress Notes (Signed)
PHARMACIST - PHYSICIAN ORDER COMMUNICATION  CONCERNING: P&T Medication Policy on Herbal Medications  DESCRIPTION:  This patient's order for:  Co Q 10  has been noted.  This product(s) is classified as an "herbal" or natural product. Due to a lack of definitive safety studies or FDA approval, nonstandard manufacturing practices, plus the potential risk of unknown drug-drug interactions while on inpatient medications, the Pharmacy and Therapeutics Committee does not permit the use of "herbal" or natural products of this type within [place].   ACTION TAKEN: The pharmacy department is unable to verify this order at this time and your patient has been informed of this safety policy. Please reevaluate patient's clinical condition at discharge and address if the herbal or natural product(s) should be resumed at that time.   Shai Rasmussen A. Lalla Laham, PharmD, BCPS, FNKF Clinical Pharmacist [place] Please utilize Amion for appropriate phone number to reach the unit pharmacist ([facility])  

## 2020-04-19 NOTE — Progress Notes (Signed)
Breast Cancer Bag given to pt and explained. Education will be reinforced during her stay.

## 2020-04-19 NOTE — Transfer of Care (Signed)
Immediate Anesthesia Transfer of Care Note  Patient: Elizabeth Maldonado  Procedure(s) Performed: LEFT MASTECTOMY WITH SENTINEL LYMPH NODE BIOPSY (Left Breast)  Patient Location: PACU  Anesthesia Type:GA combined with regional for post-op pain  Level of Consciousness: drowsy and patient cooperative  Airway & Oxygen Therapy: Patient Spontanous Breathing  Post-op Assessment: Report given to RN and Post -op Vital signs reviewed and stable  Post vital signs: Reviewed and stable  Last Vitals:  Vitals Value Taken Time  BP 100/54 04/19/20 1322  Temp    Pulse 64 04/19/20 1323  Resp 13 04/19/20 1323  SpO2 98 % 04/19/20 1323  Vitals shown include unvalidated device data.  Last Pain:  Vitals:   04/19/20 1014  TempSrc:   PainSc: 0-No pain         Complications: No complications documented.

## 2020-04-19 NOTE — Op Note (Signed)
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  After a long discussion with the patient she wishes to proceed with a left mastectomy and sentinel lymph node biopsy without immediate reconstruction  Procedure: The patient was brought to the operating room identified as the correct patient.  She was placed upon the operating table and general anesthesia was induced.  Her left breast and chest were prepped and draped in usual sterile fashion.  She had already been rejected in the preoperative holding area with radioactive isotope by the radiation technologist.  I made elliptical incision going medial to lateral on the left breast incorporating the nipple areolar complex.  I then took this down to the breast tissue with the electrocautery.  I first dissected the superior skin flap staying just underneath the dermis removing the breast tissue moving up toward the clavicle and the down toward the chest wall with cautery.  One bleeding vessel medially was controlled with a 3-0 Vicryl suture and surgical clips.  I then took the dissection toward the axilla with a superior skin flap.  I next dissected the inferior skin flap with the cautery staying underneath the dermis and going down to the inframammary ridge.  I did take this laterally toward the axilla as well.  I next dissected the breast tissue off of the pectoralis fascia moving mid to lateral with electrocautery until at the level of the  axilla.  I then completed the mastectomy removing the breast tissue with cautery.  I marked the lateral margin with a suture.  The breast was then sent to pathology for evaluation.  I then used the neoprobe to identify several sentinel lymph nodes in the deep axillary tissue.  I dissected into the deep axillary tissue and was able to identify the nodes further and grabbed them in a group with an Allis clamp and excised the lymph nodes in a group with the cautery and surgical clips.  The sentinel nodes were then sent to pathology for evaluation.  There were no other large palpable nodes and no other increased uptake of radioactive isotope in the axilla.  I then thoroughly irrigated the chest wall mastectomy flaps with saline.  Hemostasis appeared to be achieved.  I made a separate skin incision and placed a 19 [place] Blake drain into the axilla and along the chest wall.  This is sutured in place with a nylon suture.  I then closed the subtenons tissue with interrupted 3-0 Vicryl sutures and closed the skin with a running 4-0 Monocryl.  Dermabond was then applied.  Gauze and breast binder were then applied.  The bulb was placed to suction.  The patient tolerated procedure well.  All counts were correct at the end of the procedure.  The patient was then extubated in the operating room and taken in stable addition to the recovery room.          Coralie Keens   Date: 04/19/2020  Time: 1:12  PM

## 2020-04-19 NOTE — Anesthesia Procedure Notes (Signed)
Anesthesia Regional Block: Pectoralis block   Pre-Anesthetic Checklist: ,, timeout performed, Correct Patient, Correct Site, Correct Laterality, Correct Procedure, Correct Position, site marked, Risks and benefits discussed,  Surgical consent,  Pre-op evaluation,  At surgeon's request and post-op pain management  Laterality: Left  Prep: chloraprep       Needles:  Injection technique: Single-shot  Needle Type: Echogenic Needle     Needle Length: 9cm  Needle Gauge: 21     Additional Needles:   Procedures:,,,, ultrasound used (permanent image in chart),,,,  Narrative:  Start time: 04/19/2020 11:12 AM End time: 04/19/2020 11:18 AM Injection made incrementally with aspirations every 5 mL.  Performed by: Personally  Anesthesiologist: Suzette Battiest, MD

## 2020-04-19 NOTE — Anesthesia Preprocedure Evaluation (Signed)
Anesthesia Evaluation  Patient identified by MRN, date of birth, ID band Patient awake    Reviewed: Allergy & Precautions, NPO status , Patient's Chart, lab work & pertinent test results  Airway Mallampati: II  TM Distance: >3 FB Neck ROM: Full    Dental  (+) Dental Advisory Given   Pulmonary neg pulmonary ROS,    breath sounds clear to auscultation       Cardiovascular negative cardio ROS   Rhythm:Regular Rate:Normal     Neuro/Psych negative neurological ROS     GI/Hepatic Neg liver ROS, hiatal hernia,   Endo/Other  Hypothyroidism   Renal/GU negative Renal ROS     Musculoskeletal   Abdominal   Peds  Hematology negative hematology ROS (+)   Anesthesia Other Findings   Reproductive/Obstetrics                             Anesthesia Physical Anesthesia Plan  ASA: II  Anesthesia Plan: General   Post-op Pain Management:  Regional for Post-op pain   Induction: Intravenous  PONV Risk Score and Plan: 3 and Ondansetron, Dexamethasone, Midazolam and Treatment may vary due to age or medical condition  Airway Management Planned: LMA  Additional Equipment:   Intra-op Plan:   Post-operative Plan: Extubation in OR  Informed Consent: I have reviewed the patients History and Physical, chart, labs and discussed the procedure including the risks, benefits and alternatives for the proposed anesthesia with the patient or authorized representative who has indicated his/her understanding and acceptance.     Dental advisory given  Plan Discussed with: CRNA  Anesthesia Plan Comments:         Anesthesia Quick Evaluation

## 2020-04-20 ENCOUNTER — Encounter (HOSPITAL_COMMUNITY): Payer: Self-pay | Admitting: Surgery

## 2020-04-20 DIAGNOSIS — Z8541 Personal history of malignant neoplasm of cervix uteri: Secondary | ICD-10-CM | POA: Diagnosis not present

## 2020-04-20 DIAGNOSIS — Z170 Estrogen receptor positive status [ER+]: Secondary | ICD-10-CM | POA: Diagnosis not present

## 2020-04-20 DIAGNOSIS — E7800 Pure hypercholesterolemia, unspecified: Secondary | ICD-10-CM | POA: Diagnosis not present

## 2020-04-20 DIAGNOSIS — F419 Anxiety disorder, unspecified: Secondary | ICD-10-CM | POA: Diagnosis not present

## 2020-04-20 DIAGNOSIS — D0512 Intraductal carcinoma in situ of left breast: Secondary | ICD-10-CM | POA: Diagnosis not present

## 2020-04-20 DIAGNOSIS — Z79899 Other long term (current) drug therapy: Secondary | ICD-10-CM | POA: Diagnosis not present

## 2020-04-20 DIAGNOSIS — D242 Benign neoplasm of left breast: Secondary | ICD-10-CM | POA: Diagnosis not present

## 2020-04-20 LAB — BASIC METABOLIC PANEL
Anion gap: 6 (ref 5–15)
BUN: 14 mg/dL (ref 8–23)
CO2: 23 mmol/L (ref 22–32)
Calcium: 8.3 mg/dL — ABNORMAL LOW (ref 8.9–10.3)
Chloride: 108 mmol/L (ref 98–111)
Creatinine, Ser: 0.84 mg/dL (ref 0.44–1.00)
GFR, Estimated: >60 mL/min (ref >60)
Glucose, Bld: 128 mg/dL — ABNORMAL HIGH (ref 70–99)
Potassium: 4.3 mmol/L (ref 3.5–5.1)
Sodium: 137 mmol/L (ref 135–145)

## 2020-04-20 LAB — CBC
HCT: 36.3 % (ref 36.0–46.0)
Hemoglobin: 11.9 g/dL — ABNORMAL LOW (ref 12.0–15.0)
MCH: 30.7 pg (ref 26.0–34.0)
MCHC: 32.8 g/dL (ref 30.0–36.0)
MCV: 93.8 fL (ref 80.0–100.0)
Platelets: 184 10*3/uL (ref 150–400)
RBC: 3.87 MIL/uL (ref 3.87–5.11)
RDW: 13.4 % (ref 11.5–15.5)
WBC: 7.5 10*3/uL (ref 4.0–10.5)
nRBC: 0 % (ref 0.0–0.2)

## 2020-04-20 MED ORDER — OXYCODONE HCL 5 MG PO TABS: 5.0000 mg | ORAL_TABLET | Freq: Four times a day (QID) | ORAL | 0 refills | Status: DC | PRN

## 2020-04-20 NOTE — Progress Notes (Signed)
Patient ID: Elizabeth Maldonado, female   DOB: 08-07-1945, 75 y.o.   MRN: [id number]   Stable over night Some soreness this morning Flaps viable Drain serosang  Plan: Discharge home

## 2020-04-20 NOTE — Anesthesia Postprocedure Evaluation (Signed)
Anesthesia Post Note  Patient: Elizabeth Maldonado  Procedure(s) Performed: LEFT MASTECTOMY WITH SENTINEL LYMPH NODE BIOPSY (Left Breast)     Patient location during evaluation: PACU Anesthesia Type: General Level of consciousness: awake and alert Pain management: pain level controlled Vital Signs Assessment: post-procedure vital signs reviewed and stable Respiratory status: spontaneous breathing, nonlabored ventilation, respiratory function stable and patient connected to nasal cannula oxygen Cardiovascular status: blood pressure returned to baseline and stable Postop Assessment: no apparent nausea or vomiting Anesthetic complications: no   No complications documented.  Last Vitals:  Vitals:   04/19/20 1939 04/20/20 0323  BP: (!) 111/55 (!) 127/53  Pulse: 63 (!) 59  Resp: 16   Temp: 36.6 C (!) 36.4 C  SpO2: 97% 99%    Last Pain:  Vitals:   04/20/20 0740  TempSrc:   PainSc: 2                  Tiajuana Amass

## 2020-04-20 NOTE — Discharge Summary (Signed)
Physician Discharge Summary  Patient ID: PAYAL STANFORTH MRN: [id number] DOB/AGE: 73-Nov-1947 73 y.o.  Admit date: 04/19/2020 Discharge date: 04/20/2020  Admission Diagnoses:  Discharge Diagnoses:  Active Problems:   History of left breast cancer   Discharged Condition: good  Hospital Course: uneventful post op recovery, discharged home POD#1  Consults: None  Significant Diagnostic Studies:   Treatments: surgery: left mastectomy and sentinel node biopsy  Discharge Exam: Blood pressure (!) 127/53, pulse (!) 59, temperature (!) 97.5 F (36.4 C), temperature source Oral, resp. rate 16, height 5' 2.5" (1.588 m), weight 88.5 kg, SpO2 99 %. General appearance: alert, cooperative and no distress Resp: clear to auscultation bilaterally Cardio: regular rate and rhythm, S1, S2 normal, no murmur, click, rub or gallop Incision/Wound: left mastectomy flaps viable, no hematoma, drain serosang  Disposition: Discharge disposition: 01-Home or Self Care        Allergies as of 04/20/2020      Reactions   Crestor [rosuvastatin] Other (See Comments)   myalgia   Lipitor [atorvastatin] Other (See Comments)   myalgia   Colestipol Hcl Other (See Comments)   Acetaminophen Itching, Other (See Comments)   "hyperactivity"   Vytorin [ezetimibe-simvastatin] Other (See Comments)   myalgia      Medication List    TAKE these medications   b complex vitamins tablet Take 1 tablet by mouth daily with supper.   CALCIUM + D PO Take 1 tablet by mouth daily.   Calcium Carb-Cholecalciferol 500-400 MG-UNIT Tabs Take 1 tablet by mouth daily with supper.   CoQ10 200 MG Caps Take 200 mg by mouth daily with supper.   levothyroxine 75 MCG tablet Commonly known as: SYNTHROID Take 75 mcg by mouth daily before breakfast.   multivitamin with minerals Tabs tablet Take 1 tablet by mouth daily with supper.   oxyCODONE 5 MG immediate release tablet Commonly known as: Oxy IR/ROXICODONE Take 1 tablet (5  mg total) by mouth every 6 (six) hours as needed for moderate pain or severe pain.   Repatha SureClick 364 MG/ML Soaj Generic drug: Evolocumab Inject 140 mg into the skin every 14 (fourteen) days.   VISINE OP Place 1 drop into both eyes daily as needed (irritation/itchy eyes).   vitamin C 500 MG tablet Commonly known as: ASCORBIC ACID Take 500 mg by mouth daily with supper.   Vitamin D 125 MCG (5000 UT) Caps Take 5,000 Units by mouth daily with supper.       Follow-up Information    Coralie Keens, MD. Schedule an appointment as soon as possible for a visit on 04/30/2020.   Specialty: General Surgery Why: for check of drain Contact information: [address] [place] [address] [phone]               Signed: Coralie Keens 04/20/2020, 7:33 AM

## 2020-04-20 NOTE — Discharge Instructions (Signed)
[facility] [phone]  MASTECTOMY: POST OP INSTRUCTIONS  Always review your discharge instruction sheet given to you by the facility where your surgery was performed. IF YOU HAVE DISABILITY OR FAMILY LEAVE FORMS, YOU MUST BRING THEM TO THE OFFICE FOR PROCESSING.   DO NOT GIVE THEM TO YOUR DOCTOR. A prescription for pain medication may be given to you upon discharge.  Take your pain medication as prescribed, if needed.  If narcotic pain medicine is not needed, then you may take acetaminophen (Tylenol) or ibuprofen (Advil) as needed. 1. Take your usually prescribed medications unless otherwise directed. 2. If you need a refill on your pain medication, please contact your pharmacy.  They will contact our office to request authorization.  Prescriptions will not be filled after 5pm or on week-ends. 3. You should follow a light diet the first few days after arrival home, such as soup and crackers, etc.  Resume your normal diet the day after surgery. 4. Most patients will experience some swelling and bruising on the chest and underarm.  Ice packs will help.  Swelling and bruising can take several days to resolve.  5. It is common to experience some constipation if taking pain medication after surgery.  Increasing fluid intake and taking a stool softener (such as Colace) will usually help or prevent this problem from occurring.  A mild laxative (Milk of Magnesia or Miralax) should be taken according to package instructions if there are no bowel movements after 48 hours. 6. Unless discharge instructions indicate otherwise, leave your bandage dry and in place until your next appointment in 3-5 days.  You may take a limited sponge bath.  No tube baths or showers until the drains are removed.  You may have steri-strips (small skin tapes) in place directly over the incision.  These strips should be left on the skin for 7-10 days.  If your surgeon used skin glue on the incision, you may  shower in 24 hours.  The glue will flake off over the next 2-3 weeks.  Any sutures or staples will be removed at the office during your follow-up visit. 7. DRAINS:  If you have drains in place, it is important to keep a list of the amount of drainage produced each day in your drains.  Before leaving the hospital, you should be instructed on drain care.  Call our office if you have any questions about your drains. 8. ACTIVITIES:  You may resume regular (light) daily activities beginning the next day--such as daily self-care, walking, climbing stairs--gradually increasing activities as tolerated.  You may have sexual intercourse when it is comfortable.  Refrain from any heavy lifting or straining until approved by your doctor. a. You may drive when you are no longer taking prescription pain medication, you can comfortably wear a seatbelt, and you can safely maneuver your car and apply brakes. b. RETURN TO WORK:  __________________________________________________________ 9. You should see your doctor in the office for a follow-up appointment approximately 3-5 days after your surgery.  Your doctor's nurse will typically make your follow-up appointment when she calls you with your pathology report.  Expect your pathology report 2-3 business days after your surgery.  You may call to check if you do not hear from [place] after three days.   10. OTHER INSTRUCTIONS:  OK TO SHOWER STARTING TODAY 11. NO VIGOROUS ACTIVITY FOR 2 WEEK 12. ICE PACK, TYLENOL, AND IBUPROFEN ALSO FOR PAIN ______________________________________________________________________________________________ ____________________________________________________________________________________________ WHEN TO CALL YOUR DOCTOR: 1. Fever over 101.0 2.  Nausea and/or vomiting 3. Extreme swelling or bruising 4. Continued bleeding from incision. 5. Increased pain, redness, or drainage from the incision. The clinic staff is available to answer your questions  during regular business hours.  Please don't hesitate to call and ask to speak to one of the nurses for clinical concerns.  If you have a medical emergency, go to the nearest emergency room or call 911.  A surgeon from [facility] is always on call at the hospital. [address], [place], [place], [place]  [address] ? [address], [place], [place]   [address] [phone] ? [phone] ? FAX [phone] Web site: [email or web address]

## 2020-04-20 NOTE — Plan of Care (Signed)
  Problem: Education: Goal: Knowledge of General Education information will improve Description: Including pain rating scale, medication(s)/side effects and non-pharmacologic comfort measures 04/20/2020 0757 by Camillia Herter, RN Outcome: Adequate for Discharge 04/20/2020 0756 by Camillia Herter, RN Outcome: Progressing   Problem: Health Behavior/Discharge Planning: Goal: Ability to manage health-related needs will improve 04/20/2020 0757 by Camillia Herter, RN Outcome: Adequate for Discharge 04/20/2020 0756 by Camillia Herter, RN Outcome: Progressing   Problem: Clinical Measurements: Goal: Ability to maintain clinical measurements within normal limits will improve 04/20/2020 0757 by Camillia Herter, RN Outcome: Adequate for Discharge 04/20/2020 0756 by Camillia Herter, RN Outcome: Progressing Goal: Will remain free from infection 04/20/2020 0757 by Camillia Herter, RN Outcome: Adequate for Discharge 04/20/2020 0756 by Camillia Herter, RN Outcome: Progressing Goal: Diagnostic test results will improve 04/20/2020 0757 by Camillia Herter, RN Outcome: Adequate for Discharge 04/20/2020 0756 by Camillia Herter, RN Outcome: Progressing Goal: Respiratory complications will improve 04/20/2020 0757 by Camillia Herter, RN Outcome: Adequate for Discharge 04/20/2020 0756 by Camillia Herter, RN Outcome: Progressing Goal: Cardiovascular complication will be avoided 04/20/2020 0757 by Camillia Herter, RN Outcome: Adequate for Discharge 04/20/2020 0756 by Camillia Herter, RN Outcome: Progressing   Problem: Activity: Goal: Risk for activity intolerance will decrease 04/20/2020 0757 by Camillia Herter, RN Outcome: Adequate for Discharge 04/20/2020 0756 by Camillia Herter, RN Outcome: Progressing   Problem: Nutrition: Goal: Adequate nutrition will be maintained 04/20/2020 0757 by Camillia Herter, RN Outcome: Adequate for Discharge 04/20/2020 0756 by Camillia Herter, RN Outcome:  Progressing   Problem: Coping: Goal: Level of anxiety will decrease 04/20/2020 0757 by Camillia Herter, RN Outcome: Adequate for Discharge 04/20/2020 0756 by Camillia Herter, RN Outcome: Progressing   Problem: Elimination: Goal: Will not experience complications related to bowel motility 04/20/2020 0757 by Camillia Herter, RN Outcome: Adequate for Discharge 04/20/2020 0756 by Camillia Herter, RN Outcome: Progressing Goal: Will not experience complications related to urinary retention 04/20/2020 0757 by Camillia Herter, RN Outcome: Adequate for Discharge 04/20/2020 0756 by Camillia Herter, RN Outcome: Progressing   Problem: Pain Managment: Goal: General experience of comfort will improve 04/20/2020 0757 by Camillia Herter, RN Outcome: Adequate for Discharge 04/20/2020 0756 by Camillia Herter, RN Outcome: Progressing   Problem: Safety: Goal: Ability to remain free from injury will improve 04/20/2020 0757 by Camillia Herter, RN Outcome: Adequate for Discharge 04/20/2020 0756 by Camillia Herter, RN Outcome: Progressing   Problem: Skin Integrity: Goal: Risk for impaired skin integrity will decrease 04/20/2020 0757 by Camillia Herter, RN Outcome: Adequate for Discharge 04/20/2020 0756 by Camillia Herter, RN Outcome: Progressing

## 2020-04-22 LAB — SURGICAL PATHOLOGY

## 2020-04-26 ENCOUNTER — Encounter: Payer: Self-pay | Admitting: *Deleted

## 2020-04-26 DIAGNOSIS — Z170 Estrogen receptor positive status [ER+]: Secondary | ICD-10-CM

## 2020-04-28 ENCOUNTER — Telehealth: Payer: Self-pay | Admitting: Oncology

## 2020-04-28 ENCOUNTER — Encounter: Payer: Self-pay | Admitting: *Deleted

## 2020-04-28 ENCOUNTER — Telehealth: Payer: Self-pay | Admitting: Radiation Oncology

## 2020-04-28 NOTE — Telephone Encounter (Signed)
LVM for patient and LVM for daughter to try and schedule patient's consult with Dr. Kyung Rudd in radiation.

## 2020-04-28 NOTE — Telephone Encounter (Signed)
Scheduled appt per 4/27 sch msg. Pt aware.  

## 2020-05-10 ENCOUNTER — Encounter: Payer: Self-pay | Admitting: Physical Therapy

## 2020-05-18 ENCOUNTER — Ambulatory Visit: Payer: Medicare PPO | Attending: Surgery | Admitting: Physical Therapy

## 2020-05-18 ENCOUNTER — Other Ambulatory Visit: Payer: Self-pay

## 2020-05-18 DIAGNOSIS — C50412 Malignant neoplasm of upper-outer quadrant of left female breast: Secondary | ICD-10-CM | POA: Diagnosis not present

## 2020-05-18 DIAGNOSIS — I890 Lymphedema, not elsewhere classified: Secondary | ICD-10-CM | POA: Diagnosis not present

## 2020-05-18 DIAGNOSIS — R293 Abnormal posture: Secondary | ICD-10-CM | POA: Diagnosis not present

## 2020-05-18 DIAGNOSIS — Z170 Estrogen receptor positive status [ER+]: Secondary | ICD-10-CM | POA: Insufficient documentation

## 2020-05-18 DIAGNOSIS — M6281 Muscle weakness (generalized): Secondary | ICD-10-CM | POA: Diagnosis not present

## 2020-05-18 DIAGNOSIS — Z483 Aftercare following surgery for neoplasm: Secondary | ICD-10-CM | POA: Diagnosis not present

## 2020-05-18 DIAGNOSIS — M25612 Stiffness of left shoulder, not elsewhere classified: Secondary | ICD-10-CM | POA: Insufficient documentation

## 2020-05-18 NOTE — Therapy (Signed)
[place], [place], [address] Phone: [phone]   Fax:  [phone]  Physical Therapy Re-Evaluation  Patient Details  Name: Elizabeth Maldonado MRN: [id number] Date of Birth: [birth year] Referring Provider (PT): Ninfa Linden   Encounter Date: 05/18/2020   PT End of Session - 05/18/20 1713    Visit Number 2    Number of Visits 18    Date for PT Re-Evaluation 07/18/20    PT Start Time 1300    PT Stop Time 1350    PT Time Calculation (min) 50 min    Activity Tolerance Patient limited by pain    Behavior During Therapy [facility] for tasks assessed/performed           Past Medical History:  Diagnosis Date  . Breast cancer ([place]) 02/12/2020  . Diverticulosis   . Elevated blood pressure reading    165/71 a pre-op appt, reports she has seen her primary care in the last few months and intheir office it was in the 417E systolic, was not started on meds at that time, says her bp has "been creeping up since all this stuff started" , denies acute cardiac symptoms   . Family history of breast cancer   . Family history of colon cancer   . Family history of melanoma   . Hearing loss    wears hearing aids  . History of anemia as a child   . History of hiatal hernia   . Hypercholesteremia    diet controlled  . Hyperlipidemia 02/16/2016  . Hypothyroid   . Hypothyroidism 02/16/2016    Past Surgical History:  Procedure Laterality Date  . ABDOMINAL HYSTERECTOMY    . APPENDECTOMY    . BUNIONECTOMY    . CATARACT EXTRACTION    . CATARACT EXTRACTION, BILATERAL    . CESAREAN SECTION    . CHOLECYSTECTOMY    . DILATION AND CURETTAGE OF UTERUS    . EYE SURGERY    . FOOT SURGERY Right 2011   bone graft   . HYSTEROSCOPY WITH D & C N/A 06/11/2018   Procedure: DILATATION AND CURETTAGE /HYSTEROSCOPY WITH MYOSURE POLYPECTOMY;  Surgeon: Lavonia Drafts, MD;  Location: [place] [facility];  Service: Gynecology;  Laterality: N/A;  .  MASTECTOMY W/ SENTINEL NODE BIOPSY Left 04/19/2020   Procedure: LEFT MASTECTOMY WITH SENTINEL LYMPH NODE BIOPSY;  Surgeon: Coralie Keens, MD;  Location: [place] [facility];  Service: General;  Laterality: Left;  . NASAL SEPTUM SURGERY     at age 32  . ROBOTIC ASSISTED TOTAL HYSTERECTOMY WITH BILATERAL SALPINGO OOPHERECTOMY Bilateral 07/17/2018   Procedure: XI ROBOTIC ASSISTED TOTAL HYSTERECTOMY WITH BILATERAL SALPINGO OOPHORECTOMY AND LEFT PELVIC LYMPADECTOMY;  Surgeon: Everitt Amber, MD;  Location: [facility];  Service: Gynecology;  Laterality: Bilateral;  . SENTINEL NODE BIOPSY N/A 07/17/2018   Procedure: SENTINEL NODE BIOPSY;  Surgeon: Everitt Amber, MD;  Location: [facility];  Service: Gynecology;  Laterality: N/A;  . TONSILLECTOMY    . torn meniscus repair Right     There were no vitals filed for this visit.    Subjective Assessment - 05/18/20 1305    Subjective "I had my surgery 4 weeks ago today."  She has her drain out a week ago. She has a hematoma at the incision site and she saw dr. Trevor Mace PA today  today.  She is to go back in another 4 weeks. She has a little break in her incision and is bleeding a bit through it.  She has a  spongy "tennis" ball under her arm that has been uncomfortable.  she is having trouble readhing up and reaching down and she feels like she lost some strenght ( she was surprised to find that a book weighed much more than it used to )    Pertinent History L breast cancer, ER+, plan is to undergo a L mastectomy and SLNB.  04/20/2020" left mastectomy with deep axillay sentinel lymph node dissection 0/4 positive.    Patient Stated Goals to regain shoulder range of motion    Currently in Pain? Yes    Pain Score 4     Pain Location Axilla    Pain Orientation Left    Pain Descriptors / Indicators Sore;Tender    Pain Type Surgical pain    Pain Radiating Towards moves to the back    Pain Onset 1 to 4 weeks ago    Pain Frequency Constant    Aggravating Factors  leaning against a chair     Pain Relieving Factors nothing makes it better,              [facility] PT Assessment - 05/18/20 0001      Assessment   Medical Diagnosis L breast cancer    Referring Provider (PT) Ninfa Linden    Onset Date/Surgical Date 04/19/20      Prior Function   Level of [place] Needs assistance with ADLs   due to decreased left shoulder ROM   Leisure pt states she has been trying to walk, but it is slow      Cognition   Overall Cognitive Status Within Functional Limits for tasks assessed      Observation/Other Assessments   Observations pt with large hematoma on left chest that pt reports has had episodes of bleeding. The open area is covered with a telfa pad. She also has visible fullness under left arm at lateral chest that she reports is tender to the touch .    Skin Integrity incision appears fragile      AROM   Left Shoulder Flexion 85 Degrees    Left Shoulder ABduction 70 Degrees    Left Shoulder External Rotation 65 Degrees      Palpation   Palpation comment very tender trigger points in left posterior shoulder . Tender to touch at lateral and anterior chest             LYMPHEDEMA/ONCOLOGY QUESTIONNAIRE - 05/18/20 0001      Left Upper Extremity Lymphedema   10 cm Proximal to Olecranon Process 37 cm    Olecranon Process 27 cm    10 cm Proximal to Ulnar Styloid Process 25.3 cm    Just Proximal to Ulnar Styloid Process 15.5 cm    Across Hand at [employer] 18.5 cm    At [place] of 2nd Digit 6.1 cm                   Objective measurements completed on examination: See above findings.            [facility] Adult PT Treatment/Exercise - 05/18/20 0001      Exercises   Exercises Shoulder;Other Exercises    Other Exercises  neck and scapular ROM x 1 rep in sitting , 10 reps of right shoulder flexion in sitting encouraged deep breathing.      Shoulder Exercises: Supine   Protraction AAROM;Left;10 reps   manual assistance withing pain limit   Flexion 5  reps;Both;AAROM   with dowel   Flexion Limitations  also AAROM with manual assistance for shoulder flexion, diagonals, external rotation      Manual Therapy   Manual Therapy Manual Lymphatic Drainage (MLD)    Manual Lymphatic Drainage (MLD) gentle stationary circles to left back avoiding tender full areas to encourage lymphatic flow to decrease swelling.                    PT Short Term Goals - 05/18/20 1722      PT SHORT TERM GOAL #1   Title pt will be independent in a basic exericse program for shoulder ROM and strength    Time 4    Period Weeks    Status New      PT SHORT TERM GOAL #2   Title Pt will report the pain in her left upper quadrant is decreased to 2/10    Time 4    Period Weeks    Status New             PT Long Term Goals - 05/18/20 1723      PT LONG TERM GOAL #1   Title Pt will return to baseline shoulder ROM and not demonstrate any signs or symptoms of lymphedema.      PT LONG TERM GOAL #2   Title Pt will increase the number of sit to stand in 30 sec by 3 from initial measurment.    Time 8    Period Weeks    Status New      PT LONG TERM GOAL #3   Title Pt will decrease TUG score by 2 seconds from initial measurement    Time 8    Period Weeks    Status New                  Plan - 05/18/20 1714    Clinical Impression Statement Pt comes back to PT with hematoma and swelling on left anterior and lateral chest after mastectomy surgery. She also had decreased shoulder ROM and strength. She aslo reports decreased generalized strength and appears to have diffuse muscle atrophy. Anticipate she will progress slowly due to these complictions and she will benefit from PT . Recert sent for 8 weeks.    Personal Factors and Comorbidities Age;Comorbidity 2;Fitness    Comorbidities mastectomy, hematoma, diffuse muscle atrophy    Examination-Activity Limitations Reach Overhead;Dressing    Stability/Clinical Decision Making Evolving/Moderate  complexity    Clinical Decision Making Moderate    Rehab Potential Good    PT Frequency 2x / week    PT Duration 8 weeks    PT Treatment/Interventions ADLs/Self Care Home Management;Patient/family education;Therapeutic exercise;Therapeutic activities;Orthotic Fit/Training;Manual lymph drainage;Compression bandaging;Passive range of motion;Joint Manipulations;Neuromuscular re-education;Manual techniques    PT Next Visit Plan Do 30 seconds sit to stand test and TUG. A/AA/PROM to left shoulder and progress, gentle MLD montoring hematoma on chest. strengthening exercise    Consulted and Agree with Plan of Care Patient           Patient will benefit from skilled therapeutic intervention in order to improve the following deficits and impairments:  Postural dysfunction,Decreased knowledge of precautions,Impaired UE functional use,Pain,Increased edema,Decreased scar mobility,Decreased range of motion,Decreased activity tolerance,Decreased strength,Increased fascial restricitons,Decreased skin integrity,Decreased endurance,Increased muscle spasms  Visit Diagnosis: Abnormal posture - Plan: PT plan of care cert/re-cert  Malignant neoplasm of upper-outer quadrant of left breast in female, estrogen receptor positive ([place]) - Plan: PT plan of care cert/re-cert  Muscle weakness (generalized) - Plan: PT plan of care cert/re-cert  Stiffness of left shoulder, not elsewhere classified - Plan: PT plan of care cert/re-cert  Aftercare following surgery for neoplasm - Plan: PT plan of care cert/re-cert     Problem List Patient Active Problem List   Diagnosis Date Noted  . History of left breast cancer 04/19/2020  . Genetic testing 03/29/2020  . Family history of breast cancer   . Family history of melanoma   . Family history of colon cancer   . Malignant neoplasm of upper-outer quadrant of left breast in female, estrogen receptor positive ([place]) 03/09/2020  . Family history of coronary artery disease  03/06/2019  . Endometrial cancer ([place]) 07/11/2018  . Obesity (BMI 35.0-39.9 without comorbidity) 07/11/2018  . Endometrial polyp 06/11/2018  . Post-menopausal bleeding 06/06/2018  . Hyperlipidemia 02/16/2016  . Hypothyroidism 02/16/2016  . Plantar fasciitis of right foot 12/16/2015  . Metatarsal deformity, right 12/16/2015  . Pronation deformity of ankle, acquired 12/16/2015   Donato Heinz. Owens Shark PT  Norwood Levo 05/18/2020, 5:28 PM  [place], [place], [address] Phone: [phone]   Fax:  [phone]  Name: Elizabeth Maldonado MRN: [id number] Date of Birth: 1945-02-17

## 2020-05-25 NOTE — Progress Notes (Signed)
[facility]  [phone]) [phone] [phone]) [phone]     ID: Elizabeth Maldonado DOB: [birth year]  MR#: [id number]  JYN#:[id number]  Patient Care Team: Carol Ada, MD as PCP - General (Family Medicine) Skeet Latch, MD as PCP - Cardiology (Cardiology) Tanessa Tidd, Virgie Dad, MD as Consulting Physician (Oncology) Coralie Keens, MD as Consulting Physician (General Surgery) Kyung Rudd, MD as Consulting Physician (Radiation Oncology) Wilford Corner, MD as Consulting Physician (Gastroenterology) Mauro Kaufmann, RN as Oncology Nurse Navigator Rockwell Germany, RN as Oncology Nurse Navigator Chauncey Cruel, MD OTHER MD:  CHIEF COMPLAINT: Estrogen receptor positive breast cancer  CURRENT TREATMENT:   INTERVAL HISTORY: Citlalli returns today for follow up of her estrogen receptor positive breast cancer. She was evaluated in the breast cancer clinic on 03/17/2020.  She underwent genetic testing prior to her visit with me on 03/17/2020. Results were negative, with the exception of a variant of uncertain significance in PMS2.  Since her last visit, she underwent additional left breast biopsy on 03/24/2020 of the asymmetry in the lower-outer quadrant. Pathology ([id number]) revealed: invasive ductal carcinoma, grade 2; ductal carcinoma in situ with calcifications. Prognostic panel significant for: estrogen receptor >95% positive, progesterone receptor 100% positive, both with strong staining intensity; proliferation marker Ki-67 of 2%; Her2 negative by immunohistochemistry (0).  She underwent breast MRI on 03/30/2020 showing: breast composition B; clumped non-mass enhancement extending between two left breast biopsy sites, spanning 4.7 cm consistent with malignancy; scattered clumped non-mass enhancement throughout anterior left breast which is similar in appearance to patient's known malignancy; other areas of clumped non-mass enhancement identified in upper-outer left breast,  spanning 2.2 cm and 1.7 cm; no evidence of malignancy on the right or in lymph nodes.  Given all this she opted to proceed with left mastectomy on 04/19/2020 under Dr. Ninfa Linden. Pathology from the procedure ([id number]) showed: multifocal invasive ductal carcinoma-- grade 1 focus spans 1.4 cm, grade 2 focus spans 1.2 cm; intermediate grade ductal carcinoma in situ; negative resection margins; intraductal papilloma with atypical ductal hyperplasia.  All three biopsied lymph nodes were negative for carcinoma (0/3).   REVIEW OF SYSTEMS: Jillianne generally did well with her surgery although she has had a hematoma complicating outcomes.  This drained through a weak area in the incision which is still causing her some bleeding every day.  In addition for several weeks she had quite a bit of pain in the left axilla.  This thankfully is improving.  She takes Tylenol for this in the evening.  Aside from that a detailed review of systems today was stable   COVID 19 VACCINATION STATUS: [place] x2; infection 10/24/2019, no booster as of March 2022; had COVID infection 2021  HISTORY OF CURRENT ILLNESS: From the original intake note:  MICA RAMDASS had routine screening mammography on 01/06/2020 showing a possible abnormality in the left breast. She underwent left diagnostic mammography with tomography and left breast ultrasonography at [facility] on 01/13/2020 showing: breast density category B; two adjacent masses in left breast at 3 o'clock-- one measuring 1 cm and the other 0.8 cm; small asymmetry 3 cm posterior and medial to mass on mammography, no sonographic correlate; 1.6 cm group of indeterminate calcifications in retroareolar medial left breast; normal-appearing lymph nodes.  Accordingly on 02/12/2020 she proceeded to biopsy of the left breast area in question. The pathology from this procedure ([id number]) showed:  1. Left Breast, Retroareolar  - intraductal papilloma with atypia and associated dystrophic  calcifications 2. Left Breast, 3 o'clock  -  invasive ductal carcinoma, grade 2  - ductal carcinoma in situ, intermediate grade   - Prognostic indicators significant for: estrogen receptor, >95% positive and progesterone receptor, >95% positive, both with strong staining intensity. Proliferation marker Ki67 at 5%. HER2 negative by immunohistochemistry (0).  Cancer Staging Malignant neoplasm of upper-outer quadrant of left breast in female, estrogen receptor positive ([place]) Staging form: Breast, AJCC 8th Edition - Clinical stage from 03/09/2020: Stage IA (cT1c, cN0, cM0, G2, ER+, PR+, HER2-) - Signed by Chauncey Cruel, MD on 03/17/2020 Stage prefix: Initial diagnosis Method of lymph node assessment: Clinical Histologic grading system: 3 grade system  The patient's subsequent history is as detailed below.   PAST MEDICAL HISTORY: Past Medical History:  Diagnosis Date  . Breast cancer ([place]) 02/12/2020  . Diverticulosis   . Elevated blood pressure reading    165/71 a pre-op appt, reports she has seen her primary care in the last few months and intheir office it was in the 973Z systolic, was not started on meds at that time, says her bp has "been creeping up since all this stuff started" , denies acute cardiac symptoms   . Family history of breast cancer   . Family history of colon cancer   . Family history of melanoma   . Hearing loss    wears hearing aids  . History of anemia as a child   . History of hiatal hernia   . Hypercholesteremia    diet controlled  . Hyperlipidemia 02/16/2016  . Hypothyroid   . Hypothyroidism 02/16/2016    PAST SURGICAL HISTORY: Past Surgical History:  Procedure Laterality Date  . ABDOMINAL HYSTERECTOMY    . APPENDECTOMY    . BUNIONECTOMY    . CATARACT EXTRACTION    . CATARACT EXTRACTION, BILATERAL    . CESAREAN SECTION    . CHOLECYSTECTOMY    . DILATION AND CURETTAGE OF UTERUS    . EYE SURGERY    . FOOT SURGERY Right 2011   bone graft   .  HYSTEROSCOPY WITH D & C N/A 06/11/2018   Procedure: DILATATION AND CURETTAGE /HYSTEROSCOPY WITH MYOSURE POLYPECTOMY;  Surgeon: Lavonia Drafts, MD;  Location: [facility];  Service: Gynecology;  Laterality: N/A;  . MASTECTOMY W/ SENTINEL NODE BIOPSY Left 04/19/2020   Procedure: LEFT MASTECTOMY WITH SENTINEL LYMPH NODE BIOPSY;  Surgeon: Coralie Keens, MD;  Location: [facility];  Service: General;  Laterality: Left;  . NASAL SEPTUM SURGERY     at age 70  . ROBOTIC ASSISTED TOTAL HYSTERECTOMY WITH BILATERAL SALPINGO OOPHERECTOMY Bilateral 07/17/2018   Procedure: XI ROBOTIC ASSISTED TOTAL HYSTERECTOMY WITH BILATERAL SALPINGO OOPHORECTOMY AND LEFT PELVIC LYMPADECTOMY;  Surgeon: Everitt Amber, MD;  Location: [facility];  Service: Gynecology;  Laterality: Bilateral;  . SENTINEL NODE BIOPSY N/A 07/17/2018   Procedure: SENTINEL NODE BIOPSY;  Surgeon: Everitt Amber, MD;  Location: [facility];  Service: Gynecology;  Laterality: N/A;  . TONSILLECTOMY    . torn meniscus repair Right     FAMILY HISTORY: Family History  Problem Relation Age of Onset  . Hypertension Mother   . Heart attack Mother   . Heart attack Father   . Lung cancer Father   . Skin cancer Father   . Diabetes Sister   . Cervical cancer Sister   . Skin cancer Sister   . Diabetes Brother   . CAD Brother   . Lymphoma Brother   . Skin cancer Brother   . Diabetes Brother   . Melanoma Brother   .  Melanoma Brother   . Prostate cancer Brother   . Diverticulitis Brother   . Skin cancer Brother   . Skin cancer Sister   . Breast cancer Maternal Aunt 35  . Colon cancer Paternal Grandmother   . Lung cancer Maternal Aunt   . Leukemia Cousin   . Lung cancer Cousin   The patient's father died at the age of 27 from lung cancer in the setting of tobacco abuse.  His mother had colon cancer and his father may have had some unknown type of cancer.  The patient's father had 1 brother and 1 sister with no cancer and also no children.  The patient's mother died  at the age of 96 from renal failure.  Her parents did not have cancer.  She had 1 sister with breast cancer at the age of 60, 1 sister with lung cancer and a second sister with         .  Also on the maternal side there were 2 cousins with lung cancer and one with leukemia.   GYNECOLOGIC HISTORY:  No LMP recorded (lmp unknown). Patient is postmenopausal. Menarche: 75 years old Age at first live birth: 75 years old [place] P 1 LMP age 40 Contraceptive HRT at least 5 years  Hysterectomy? Yes, 07/2018, for grade 1 endometrial cancer, final pathology showed no residual carcinoma BSO? 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  She lives with her husband Mortimer Fries, currently 64, who is a retired Financial risk analyst working for [employer].  Their daughter Berline Chough, 15, lives in [place] and is a clinical psychiatrist working at the state mental [facility].  The patient has no grandchildren.  She attends [employer] and also a [employer] which is her husband's denomination.  They both go to both churches every Sunday and participated in both communities.   ADVANCED DIRECTIVES: In the absence of any documentation to the contrary, the patient's spouse is their HCPOA.    HEALTH MAINTENANCE: Social History   Tobacco Use  . Smoking status: Never Smoker  . Smokeless tobacco: Never Used  Vaping Use  . Vaping Use: Never used  Substance Use Topics  . Alcohol use: Not Currently    Comment: one to two beers a year  . Drug use: No     Colonoscopy: April 2014/ [facility]  PAP: 01/2016, negative  Bone density:    Allergies  Allergen Reactions  . Crestor [Rosuvastatin] Other (See Comments)    myalgia  . Lipitor [Atorvastatin] Other (See Comments)    myalgia  . Colestipol Hcl Other (See Comments)  . Acetaminophen Itching and Other (See Comments)    "hyperactivity"  . Vytorin [Ezetimibe-Simvastatin] Other (See  Comments)    myalgia    Current Outpatient Medications  Medication Sig Dispense Refill  . b complex vitamins tablet Take 1 tablet by mouth daily with supper.    . Calcium Carb-Cholecalciferol 500-400 MG-UNIT TABS Take 1 tablet by mouth daily with supper.    . Calcium Citrate-Vitamin D (CALCIUM + D PO) Take 1 tablet by mouth daily.    . Cholecalciferol (VITAMIN D) 125 MCG (5000 UT) CAPS Take 5,000 Units by mouth daily with supper.    . Coenzyme Q10 (COQ10) 200 MG CAPS Take 200 mg by mouth daily with supper.    . levothyroxine (SYNTHROID, LEVOTHROID) 75 MCG tablet Take 75 mcg by mouth daily before breakfast.    . Multiple Vitamin (MULTIVITAMIN  WITH MINERALS) TABS tablet Take 1 tablet by mouth daily with supper.    Marland Kitchen oxyCODONE (OXY IR/ROXICODONE) 5 MG immediate release tablet Take 1 tablet (5 mg total) by mouth every 6 (six) hours as needed for moderate pain or severe pain. 25 tablet 0  . REPATHA SURECLICK 885 MG/ML SOAJ Inject 140 mg into the skin every 14 (fourteen) days.    . Tetrahydrozoline HCl (VISINE OP) Place 1 drop into both eyes daily as needed (irritation/itchy eyes).    . vitamin C (ASCORBIC ACID) 500 MG tablet Take 500 mg by mouth daily with supper.     No current facility-administered medications for this visit.    OBJECTIVE: White woman who appears stated age  14:   05/26/20 1336  BP: 135/64  Pulse: 80  Resp: 18  Temp: (!) 97 F (36.1 C)  SpO2: 99%     Body mass index is 35.73 kg/m.   Wt Readings from Last 3 Encounters:  05/26/20 198 lb 8 oz (90 kg)  04/19/20 195 lb (88.5 kg)  04/14/20 (P) 197 lb 9 oz (89.6 kg)      ECOG FS:1 - Symptomatic but completely ambulatory  Sclerae unicteric, EOMs intact Wearing a mask No cervical or supraclavicular adenopathy Lungs no rales or rhonchi Heart regular rate and rhythm Abd soft, obese nontender, positive bowel sounds MSK no focal spinal tenderness, no upper extremity lymphedema Neuro: nonfocal, well oriented,  appropriate affect Breasts: The right breast is unremarkable.  The left breast is status post mastectomy.  The pigmentation is due to the underlying hematoma but the incision is healing generally well except for one area where there is some leakage and some dehiscence is noted in the image below.  Both axillae are benign.  Left chest wall 05/26/2020       LAB RESULTS:  CMP     Component Value Date/Time   NA 137 04/20/2020 0308   K 4.3 04/20/2020 0308   CL 108 04/20/2020 0308   CO2 23 04/20/2020 0308   GLUCOSE 128 (H) 04/20/2020 0308   BUN 14 04/20/2020 0308   CREATININE 0.84 04/20/2020 0308   CREATININE 0.99 08/30/2018 1214   CALCIUM 8.3 (L) 04/20/2020 0308   PROT 7.6 10/24/2019 1052   ALBUMIN 4.0 10/24/2019 1052   AST 21 10/24/2019 1052   ALT 17 10/24/2019 1052   ALKPHOS 69 10/24/2019 1052   BILITOT 1.1 10/24/2019 1052   GFRNONAA >60 04/20/2020 0308   GFRNONAA 57 (L) 08/30/2018 1214   GFRAA >60 08/30/2018 1214    No results found for: TOTALPROTELP, ALBUMINELP, A1GS, A2GS, BETS, BETA2SER, GAMS, MSPIKE, SPEI  Lab Results  Component Value Date   WBC 7.5 04/20/2020   NEUTROABS 3.4 10/24/2019   HGB 11.9 (L) 04/20/2020   HCT 36.3 04/20/2020   MCV 93.8 04/20/2020   PLT 184 04/20/2020    No results found for: LABCA2  No components found for: OYDXAJ287  No results for input(s): INR in the last 168 hours.  No results found for: LABCA2  No results found for: OMV672  No results found for: CNO709  No results found for: GGE366  No results found for: CA2729  No components found for: HGQUANT  No results found for: CEA1 / No results found for: CEA1   No results found for: AFPTUMOR  No results found for: CHROMOGRNA  No results found for: KPAFRELGTCHN, LAMBDASER, KAPLAMBRATIO (kappa/lambda light chains)  No results found for: HGBA, HGBA2QUANT, HGBFQUANT, HGBSQUAN (Hemoglobinopathy evaluation)   No results found  for: LDH  No results found for: IRON, TIBC,  IRONPCTSAT (Iron and TIBC)  No results found for: FERRITIN  Urinalysis    Component Value Date/Time   COLORURINE STRAW (A) 08/29/2018 1148   APPEARANCEUR CLEAR 08/29/2018 1148   LABSPEC 1.005 08/29/2018 1148   PHURINE 6.0 08/29/2018 1148   GLUCOSEU NEGATIVE 08/29/2018 1148   [place] 08/29/2018 [place] 08/29/2018 [place] 08/29/2018 1148   PROTEINUR NEGATIVE 08/29/2018 1148   UROBILINOGEN 0.2 11/19/2011 1851   NITRITE NEGATIVE 08/29/2018 1148   LEUKOCYTESUR NEGATIVE 08/29/2018 1148    STUDIES: No results found.   ELIGIBLE FOR AVAILABLE RESEARCH PROTOCOL: no  ASSESSMENT: 75 y.o. [place] woman status post left breast biopsy 02/12/2020 for a clinical T1b N0, stage IA invasive ductal carcinoma, grade 2, estrogen and progesterone receptor positive, HER-2 nonamplified, with an MIB-1 of 5%.  (1) history of stage IA grade 1 endometrioid endometrial cancer, status post robotic assisted total hysterectomy with bilateral salpingo-oophorectomy, sentinel lymph node biopsy and left pelvic lymphadenectomy 07/17/2018  (a) given good prognosis no adjuvant therapy required for the endometrial cancer  (2) genetics testing 03/26/2020 through the CancerNext-Expanded gene panel offered by [facility]) found no deleterious mutations in  AIP, ALK, APC*, ATM*, AXIN2, BAP1, BARD1, BLM, BMPR1A, BRCA1*, BRCA2*, BRIP1*, CDC73, CDH1*, CDK4, CDKN1B, CDKN2A, CHEK2*, CTNNA1, DICER1, FANCC, FH, FLCN, GALNT12, KIF1B, LZTR1, MAX, MEN1, MET, MLH1*, MSH2*, MSH3, MSH6*, MUTYH*, NBN, NF1*, NF2, NTHL1, PALB2*, PHOX2B, PMS2*, POT1, PRKAR1A, PTCH1, PTEN*, RAD51C*, RAD51D*, RB1, RECQL, RET, SDHA, SDHAF2, SDHB, SDHC, SDHD, SMAD4, SMARCA4, SMARCB1, SMARCE1, STK11, SUFU, TMEM127, TP53*, TSC1, TSC2, VHL and XRCC2 (sequencing and deletion/duplication); EGFR, EGLN1, HOXB13, KIT, MITF, PDGFRA, POLD1, and POLE (sequencing only); EPCAM and GREM1 (deletion/duplication only). DNA and RNA  analyses performed for * genes. The report date is April 01, 2020. Negative genetic testing on the BRCAplus panel test.  The BRCAPlus gene panel offered by [facility] and includes sequencing and rearrangement analysis for the following 6 genes: BRCA1, BRCA2, CDH1, PALB2, PTEN, and TP53.     (a) PMS2 VUS identified.   (3) status post left mastectomy and sentinel lymph node sampling 04/19/2020 for an mpT1c N0, stage IA invasive ductal carcinoma, grade 1-2, with negative margin  (a) a total of 3 left axillary lymph nodes removed  (4) anastrozole started 05/26/2020   PLAN: Yovanna did generally well with her surgery except for the hematoma complication.  This continues to bother her.  She is still also having neuropathic pain in the left axilla, which is of course very common.  I think she would benefit from gabapentin at bedtime and I went ahead and wrote her that prescription.  We discussed the difference between tamoxifen and anastrozole in detail. She understands that anastrozole and the aromatase inhibitors in general work by blocking estrogen production. Accordingly vaginal dryness, decrease in bone density, and of course hot flashes can result. The aromatase inhibitors can also negatively affect the cholesterol profile, although that is a minor effect. One out of 5 women on aromatase inhibitors we will feel "old and achy". This arthralgia/myalgia syndrome, which resembles fibromyalgia clinically, does resolve with stopping the medications. Accordingly this is not a reason to not try an aromatase inhibitor but it is a frequent reason to stop it (in other words 20% of women will not be able to tolerate these medications).  Tamoxifen on the other hand does not block estrogen production. It does not "take away a woman's estrogen". It  blocks the estrogen receptor in breast cells. Like anastrozole, it can also cause hot flashes. As opposed to anastrozole, tamoxifen has many estrogen-like effects. It is  technically an estrogen receptor modulator. This means that in some tissues tamoxifen works like estrogen-- for example it helps strengthen the bones. It tends to improve the cholesterol profile. It can cause thickening of the endometrial lining, and even endometrial polyps or rarely cancer of the uterus.(The risk of uterine cancer due to tamoxifen is one additional cancer per thousand women year). It can cause vaginal wetness or stickiness. It can cause blood clots through this estrogen-like effect--the risk of blood clots with tamoxifen is exactly the same as with birth control pills or hormone replacement.  Neither of these agents causes mood changes or weight gain, despite the popular belief that they can have these side effects. We have data from studies comparing either of these drugs with placebo, and in those cases the control group had the same amount of weight gain and depression as the group that took the drug.  Given the fact that she is still having some healing issues I would prefer to start with anastrozole.  If she tolerates it well we will do that for 5 years if not we will switch to tamoxifen.  I have set her up for mammography and a bone density in October and she will see me shortly after those tests.  If everything is acceptable at that time we will probably go to once a year follow-up  Total encounter time 35 minutes.Sarajane Jews C. Finnick Orosz, MD 05/26/2020 1:48 PM Medical Oncology and Hematology [facility] [place], [place] [address] Tel. [phone]    Fax. [phone]   This document serves as a record of services personally performed by Lurline Del, MD. It was created on his behalf by Wilburn Mylar, a trained medical scribe. The creation of this record is based on the scribe's personal observations and the provider's statements to them.   I, Lurline Del MD, have reviewed the above documentation for accuracy and completeness, and  I agree with the above.   *Total Encounter Time as defined by the Centers for Medicare and Medicaid Services includes, in addition to the face-to-face time of a patient visit (documented in the note above) non-face-to-face time: obtaining and reviewing outside history, ordering and reviewing medications, tests or procedures, care coordination (communications with other health care professionals or caregivers) and documentation in the medical record.

## 2020-05-26 ENCOUNTER — Other Ambulatory Visit: Payer: Self-pay

## 2020-05-26 ENCOUNTER — Inpatient Hospital Stay: Payer: Medicare PPO | Attending: Oncology | Admitting: Oncology

## 2020-05-26 VITALS — BP 135/64 | HR 80 | Temp 97.0°F | Resp 18 | Ht 62.5 in | Wt 198.5 lb

## 2020-05-26 DIAGNOSIS — Z90722 Acquired absence of ovaries, bilateral: Secondary | ICD-10-CM | POA: Diagnosis not present

## 2020-05-26 DIAGNOSIS — Z8616 Personal history of COVID-19: Secondary | ICD-10-CM | POA: Diagnosis not present

## 2020-05-26 DIAGNOSIS — C50412 Malignant neoplasm of upper-outer quadrant of left female breast: Secondary | ICD-10-CM | POA: Insufficient documentation

## 2020-05-26 DIAGNOSIS — E785 Hyperlipidemia, unspecified: Secondary | ICD-10-CM | POA: Diagnosis not present

## 2020-05-26 DIAGNOSIS — E7800 Pure hypercholesterolemia, unspecified: Secondary | ICD-10-CM | POA: Insufficient documentation

## 2020-05-26 DIAGNOSIS — Z90710 Acquired absence of both cervix and uterus: Secondary | ICD-10-CM | POA: Diagnosis not present

## 2020-05-26 DIAGNOSIS — Z8542 Personal history of malignant neoplasm of other parts of uterus: Secondary | ICD-10-CM | POA: Diagnosis not present

## 2020-05-26 DIAGNOSIS — Z170 Estrogen receptor positive status [ER+]: Secondary | ICD-10-CM | POA: Insufficient documentation

## 2020-05-26 DIAGNOSIS — Z79811 Long term (current) use of aromatase inhibitors: Secondary | ICD-10-CM | POA: Diagnosis not present

## 2020-05-26 DIAGNOSIS — Z9012 Acquired absence of left breast and nipple: Secondary | ICD-10-CM | POA: Diagnosis not present

## 2020-05-26 DIAGNOSIS — E039 Hypothyroidism, unspecified: Secondary | ICD-10-CM | POA: Diagnosis not present

## 2020-05-26 MED ORDER — GABAPENTIN 300 MG PO CAPS: 300.0000 mg | ORAL_CAPSULE | Freq: Every day | ORAL | 4 refills | Status: DC

## 2020-05-26 MED ORDER — ANASTROZOLE 1 MG PO TABS: 1.0000 mg | ORAL_TABLET | Freq: Every day | ORAL | 4 refills | Status: DC

## 2020-05-27 ENCOUNTER — Telehealth: Payer: Self-pay | Admitting: Oncology

## 2020-05-27 ENCOUNTER — Encounter: Payer: Self-pay | Admitting: Physical Therapy

## 2020-05-27 ENCOUNTER — Ambulatory Visit: Payer: Medicare PPO | Admitting: Physical Therapy

## 2020-05-27 DIAGNOSIS — I890 Lymphedema, not elsewhere classified: Secondary | ICD-10-CM | POA: Diagnosis not present

## 2020-05-27 DIAGNOSIS — R293 Abnormal posture: Secondary | ICD-10-CM | POA: Diagnosis not present

## 2020-05-27 DIAGNOSIS — M6281 Muscle weakness (generalized): Secondary | ICD-10-CM | POA: Diagnosis not present

## 2020-05-27 DIAGNOSIS — M25612 Stiffness of left shoulder, not elsewhere classified: Secondary | ICD-10-CM

## 2020-05-27 DIAGNOSIS — Z483 Aftercare following surgery for neoplasm: Secondary | ICD-10-CM | POA: Diagnosis not present

## 2020-05-27 DIAGNOSIS — Z170 Estrogen receptor positive status [ER+]: Secondary | ICD-10-CM | POA: Diagnosis not present

## 2020-05-27 DIAGNOSIS — C50412 Malignant neoplasm of upper-outer quadrant of left female breast: Secondary | ICD-10-CM | POA: Diagnosis not present

## 2020-05-27 NOTE — Therapy (Signed)
[place], [place], [address] Phone: [phone]   Fax:  [phone]  Physical Therapy Treatment  Patient Details  Name: Elizabeth Maldonado MRN: [id number] Date of Birth: 1945/08/25 Referring Provider (PT): Ninfa Linden   Encounter Date: 05/27/2020   PT End of Session - 05/27/20 1027    Visit Number 3    Number of Visits 18    Date for PT Re-Evaluation 07/18/20    PT Start Time 1300    PT Stop Time 1345    PT Time Calculation (min) 45 min    Activity Tolerance Patient limited by pain    Behavior During Therapy [facility] for tasks assessed/performed           Past Medical History:  Diagnosis Date  . Breast cancer ([place]) 02/12/2020  . Diverticulosis   . Elevated blood pressure reading    165/71 a pre-op appt, reports she has seen her primary care in the last few months and intheir office it was in the 253G systolic, was not started on meds at that time, says her bp has "been creeping up since all this stuff started" , denies acute cardiac symptoms   . Family history of breast cancer   . Family history of colon cancer   . Family history of melanoma   . Hearing loss    wears hearing aids  . History of anemia as a child   . History of hiatal hernia   . Hypercholesteremia    diet controlled  . Hyperlipidemia 02/16/2016  . Hypothyroid   . Hypothyroidism 02/16/2016    Past Surgical History:  Procedure Laterality Date  . ABDOMINAL HYSTERECTOMY    . APPENDECTOMY    . BUNIONECTOMY    . CATARACT EXTRACTION    . CATARACT EXTRACTION, BILATERAL    . CESAREAN SECTION    . CHOLECYSTECTOMY    . DILATION AND CURETTAGE OF UTERUS    . EYE SURGERY    . FOOT SURGERY Right 2011   bone graft   . HYSTEROSCOPY WITH D & C N/A 06/11/2018   Procedure: DILATATION AND CURETTAGE /HYSTEROSCOPY WITH MYOSURE POLYPECTOMY;  Surgeon: Lavonia Drafts, MD;  Location: [place];  Service: Gynecology;  Laterality: N/A;  . MASTECTOMY W/  SENTINEL NODE BIOPSY Left 04/19/2020   Procedure: LEFT MASTECTOMY WITH SENTINEL LYMPH NODE BIOPSY;  Surgeon: Coralie Keens, MD;  Location: [place];  Service: General;  Laterality: Left;  . NASAL SEPTUM SURGERY     at age 34  . ROBOTIC ASSISTED TOTAL HYSTERECTOMY WITH BILATERAL SALPINGO OOPHERECTOMY Bilateral 07/17/2018   Procedure: XI ROBOTIC ASSISTED TOTAL HYSTERECTOMY WITH BILATERAL SALPINGO OOPHORECTOMY AND LEFT PELVIC LYMPADECTOMY;  Surgeon: Everitt Amber, MD;  [facility];  Service: Gynecology;  Laterality: Bilateral;  . SENTINEL NODE BIOPSY N/A 07/17/2018   Procedure: SENTINEL NODE BIOPSY;  Surgeon: Everitt Amber, MD;  Location[facility];  Service: Gynecology;  Laterality: N/A;  . TONSILLECTOMY    . torn meniscus repair Right     There were no vitals filed for this visit.   Subjective Assessment - 05/27/20 1309    Subjective Pt states she is doing  much better, but she has constant discomfort around her back and down her back. She has been doing her exercises at home    Pertinent History L breast cancer, ER+, plan is to undergo a L mastectomy and SLNB.  04/20/2020" left mastectomy with deep axillay sentinel lymph node dissection 0/4 positive.    Patient Stated  Goals to regain shoulder range of motion    Currently in Pain? Yes    Pain Score 6     Pain Location --   stingsing   Pain Orientation Left    Pain Descriptors / Indicators --   stinging             [facility] PT Assessment - 05/27/20 0001      Functional Tests   Functional tests Sit to Stand      Sit to Stand   Comments 21 reps of sit to standin in 30 sec. pt felt she could do more      AROM   Left Shoulder Flexion 110 Degrees    Left Shoulder ABduction 93 Degrees                         [facility] Adult PT Treatment/Exercise - 05/27/20 0001      Exercises   Exercises Shoulder      Shoulder Exercises: Supine   Protraction AAROM;Left;10 reps   manual assistance withing pain limit   Other Supine Exercises  hand pointed to ceiling , small circles in each direction with cues for control      Manual Therapy   Manual Therapy Manual Lymphatic Drainage (MLD);Passive ROM    Manual therapy comments added piece of white foam on peach foam with horizontal lines at lateral chest.    Manual Lymphatic Drainage (MLD) in sitting, deep breaths and short neck to posterior shoulder, stationary circles from posterior shoulder and lateral chest  across back gentle pressure on full area at lateal chest with skin movment around it.    Passive ROM PROM to left shoulder in supine withing limits                    PT Short Term Goals - 05/18/20 1722      PT SHORT TERM GOAL #1   Title pt will be independent in a basic exericse program for shoulder ROM and strength    Time 4    Period Weeks    Status New      PT SHORT TERM GOAL #2   Title Pt will report the pain in her left upper quadrant is decreased to 2/10    Time 4    Period Weeks    Status New             PT Long Term Goals - 05/18/20 1723      PT LONG TERM GOAL #1   Title Pt will return to baseline shoulder ROM and not demonstrate any signs or symptoms of lymphedema.      PT LONG TERM GOAL #2   Title Pt will increase the number of sit to stand in 30 sec by 3 from initial measurment.    Time 8    Period Weeks    Status New      PT LONG TERM GOAL #3   Title Pt will decrease TUG score by 2 seconds from initial measurement    Time 8    Period Weeks    Status New                 Plan - 05/27/20 1357    Clinical Impression Statement Pt continues to have drainage from her hematoma, and "stinginess" at lateral chest. Began MLD to full area at back and lateral chest and added foam for gentle compression. Upgraded home exericse to include small circles  for beginning strength and control  Pt did very well with 30 sec sit to stand so does not need to work on mobility    Personal Factors and Comorbidities Age;Comorbidity 2;Fitness     Comorbidities mastectomy, hematoma, diffuse muscle atrophy    Examination-Activity Limitations Reach Overhead;Dressing    Rehab Potential Good    PT Frequency 2x / week    PT Duration 8 weeks    PT Treatment/Interventions ADLs/Self Care Home Management;Patient/family education;Therapeutic exercise;Therapeutic activities;Orthotic Fit/Training;Manual lymph drainage;Compression bandaging;Passive range of motion;Joint Manipulations;Neuromuscular re-education;Manual techniques    PT Next Visit Plan . A/AA/PROM to left shoulder and progress, gentle MLD montoring hematoma on chest. strengthening exercise           Patient will benefit from skilled therapeutic intervention in order to improve the following deficits and impairments:  Postural dysfunction,Decreased knowledge of precautions,Impaired UE functional use,Pain,Increased edema,Decreased scar mobility,Decreased range of motion,Decreased activity tolerance,Decreased strength,Increased fascial restricitons,Decreased skin integrity,Decreased endurance,Increased muscle spasms  Visit Diagnosis: Aftercare following surgery for neoplasm  Stiffness of left shoulder, not elsewhere classified  Muscle weakness (generalized)  Lymphedema, not elsewhere classified     Problem List Patient Active Problem List   Diagnosis Date Noted  . History of left breast cancer 04/19/2020  . Genetic testing 03/29/2020  . Family history of breast cancer   . Family history of melanoma   . Family history of colon cancer   . Malignant neoplasm of upper-outer quadrant of left breast in female, estrogen receptor positive ([place]) 03/09/2020  . Family history of coronary artery disease 03/06/2019  . Endometrial cancer ([place]) 07/11/2018  . Obesity (BMI 35.0-39.9 without comorbidity) 07/11/2018  . Endometrial polyp 06/11/2018  . Post-menopausal bleeding 06/06/2018  . Hyperlipidemia 02/16/2016  . Hypothyroidism 02/16/2016  . Plantar fasciitis of right foot 12/16/2015   . Metatarsal deformity, right 12/16/2015  . Pronation deformity of ankle, acquired 12/16/2015   Donato Heinz. Owens Shark PT  Norwood Levo 05/27/2020, 2:01 PM  [place], [place], [address] Phone: [phone]   Fax:  [phone]  Name: JUDENE LOGUE MRN: [id number] Date of Birth: 1945/02/23

## 2020-05-27 NOTE — Telephone Encounter (Signed)
Scheduled appointment per 05/25 los. Patient is aware.

## 2020-06-01 ENCOUNTER — Other Ambulatory Visit: Payer: Self-pay

## 2020-06-01 ENCOUNTER — Ambulatory Visit: Payer: Medicare PPO | Attending: Surgery | Admitting: Physical Therapy

## 2020-06-01 DIAGNOSIS — M25612 Stiffness of left shoulder, not elsewhere classified: Secondary | ICD-10-CM | POA: Diagnosis not present

## 2020-06-01 DIAGNOSIS — R293 Abnormal posture: Secondary | ICD-10-CM | POA: Diagnosis not present

## 2020-06-01 DIAGNOSIS — C50412 Malignant neoplasm of upper-outer quadrant of left female breast: Secondary | ICD-10-CM | POA: Diagnosis not present

## 2020-06-01 DIAGNOSIS — Z170 Estrogen receptor positive status [ER+]: Secondary | ICD-10-CM | POA: Diagnosis not present

## 2020-06-01 DIAGNOSIS — I890 Lymphedema, not elsewhere classified: Secondary | ICD-10-CM | POA: Diagnosis not present

## 2020-06-01 DIAGNOSIS — Z483 Aftercare following surgery for neoplasm: Secondary | ICD-10-CM | POA: Diagnosis not present

## 2020-06-01 DIAGNOSIS — M6281 Muscle weakness (generalized): Secondary | ICD-10-CM | POA: Diagnosis not present

## 2020-06-01 NOTE — Therapy (Signed)
[place], [place], [address] Phone: [phone]   Fax:  [phone]  Physical Therapy Treatment  Patient Details  Name: Elizabeth Maldonado MRN: [id number] Date of Birth: 03-15-1945 Referring Provider (PT): Ninfa Linden   Encounter Date: 06/01/2020   PT End of Session - 06/01/20 9417    Visit Number 4    Number of Visits 18    Date for PT Re-Evaluation 07/18/20    PT Start Time 1300    PT Stop Time 1345    PT Time Calculation (min) 45 min    Activity Tolerance Patient limited by pain    Behavior During Therapy [facility] for tasks assessed/performed           Past Medical History:  Diagnosis Date  . Breast cancer ([place]) 02/12/2020  . Diverticulosis   . Elevated blood pressure reading    165/71 a pre-op appt, reports she has seen her primary care in the last few months and intheir office it was in the 408X systolic, was not started on meds at that time, says her bp has "been creeping up since all this stuff started" , denies acute cardiac symptoms   . Family history of breast cancer   . Family history of colon cancer   . Family history of melanoma   . Hearing loss    wears hearing aids  . History of anemia as a child   . History of hiatal hernia   . Hypercholesteremia    diet controlled  . Hyperlipidemia 02/16/2016  . Hypothyroid   . Hypothyroidism 02/16/2016    Past Surgical History:  Procedure Laterality Date  . ABDOMINAL HYSTERECTOMY    . APPENDECTOMY    . BUNIONECTOMY    . CATARACT EXTRACTION    . CATARACT EXTRACTION, BILATERAL    . CESAREAN SECTION    . CHOLECYSTECTOMY    . DILATION AND CURETTAGE OF UTERUS    . EYE SURGERY    . FOOT SURGERY Right 2011   bone graft   . HYSTEROSCOPY WITH D & C N/A 06/11/2018   Procedure: DILATATION AND CURETTAGE /HYSTEROSCOPY WITH MYOSURE POLYPECTOMY;  Surgeon: Lavonia Drafts, MD;  Location: [place];  Service: Gynecology;  Laterality: N/A;  . MASTECTOMY W/  SENTINEL NODE BIOPSY Left 04/19/2020   Procedure: LEFT MASTECTOMY WITH SENTINEL LYMPH NODE BIOPSY;  Surgeon: Coralie Keens, MD;  Location: [place];  Service: General;  Laterality: Left;  . NASAL SEPTUM SURGERY     at age 23  . ROBOTIC ASSISTED TOTAL HYSTERECTOMY WITH BILATERAL SALPINGO OOPHERECTOMY Bilateral 07/17/2018   Procedure: XI ROBOTIC ASSISTED TOTAL HYSTERECTOMY WITH BILATERAL SALPINGO OOPHORECTOMY AND LEFT PELVIC LYMPADECTOMY;  Surgeon: Everitt Amber, MD;  [facility];  Service: Gynecology;  Laterality: Bilateral;  . SENTINEL NODE BIOPSY N/A 07/17/2018   Procedure: SENTINEL NODE BIOPSY;  Surgeon: Everitt Amber, MD;  [facility];  Service: Gynecology;  Laterality: N/A;  . TONSILLECTOMY    . torn meniscus repair Right     There were no vitals filed for this visit.   Subjective Assessment - 06/01/20 1307    Subjective Pt states that she has been doing her exercises at home and has been wearing the pad in her bra. She thinks she it has been helping.  She has been able to use the teller machine at the bank without pain today    Pertinent History L breast cancer, ER+, plan is to undergo a L mastectomy and SLNB.  04/20/2020" left  mastectomy with deep axillay sentinel lymph node dissection 0/4 positive.    Patient Stated Goals to regain shoulder range of motion    Currently in Pain? Yes    Pain Score 3     Pain Location Chest   stinging along the incision   Pain Type Surgical pain    Pain Onset 1 to 4 weeks ago    Pain Frequency Constant                             [facility] Adult PT Treatment/Exercise - 06/01/20 0001      Self-Care   Self-Care Other Self-Care Comments    Other Self-Care Comments  given information about [employer] to [employer] and script for compression bra with extender Pt to get an appt in about a month      Exercises   Exercises Shoulder    Other Exercises  neck and scapular circles      Shoulder Exercises: Supine   Protraction AROM;15 reps   3  sets of 5   Other Supine Exercises hand pointed to ceiling , small circles in each direction with cues for control    Other Supine Exercises 3 sets of 5 tricep extensions      Shoulder Exercises: Seated   Flexion AROM;Right;Left;5 reps    Diagonals AROM;Right;Left;5 reps    Other Seated Exercises UE ranger flex/ extension/diagonals      Shoulder Exercises: Standing   Other Standing Exercises ue ranger on wall for partial range flexion/ extension/ half circles      Manual Therapy   Manual Therapy Manual Lymphatic Drainage (MLD);Passive ROM    Manual therapy comments moved piece of foam to anteror chest over full area near incisions    Manual Lymphatic Drainage (MLD) in right sidelying stationary circles at left lateral chest and along back ,                    PT Short Term Goals - 05/18/20 1722      PT SHORT TERM GOAL #1   Title pt will be independent in a basic exericse program for shoulder ROM and strength    Time 4    Period Weeks    Status New      PT SHORT TERM GOAL #2   Title Pt will report the pain in her left upper quadrant is decreased to 2/10    Time 4    Period Weeks    Status New             PT Long Term Goals - 05/18/20 1723      PT LONG TERM GOAL #1   Title Pt will return to baseline shoulder ROM and not demonstrate any signs or symptoms of lymphedema.      PT LONG TERM GOAL #2   Title Pt will increase the number of sit to stand in 30 sec by 3 from initial measurment.    Time 8    Period Weeks    Status New      PT LONG TERM GOAL #3   Title Pt will decrease TUG score by 2 seconds from initial measurement    Time 8    Period Weeks    Status New                 Plan - 06/01/20 1748    Clinical Impression Statement Pt continues to improve and the drainage from  her hematoma has stopped.  She has benefitted from the gentle compression at latearal chest so it was moved to the front of her chest and pt wil wear it there about an hour  at time, monitoring her skin.  She was able to do an upgraded exercise session today, but had fatigue in her shoulder at end of the session.    Personal Factors and Comorbidities Age;Comorbidity 2;Fitness    Comorbidities mastectomy, hematoma, diffuse muscle atrophy    Examination-Activity Limitations Reach Overhead;Dressing    Stability/Clinical Decision Making Evolving/Moderate complexity    Rehab Potential Good    PT Frequency 2x / week    PT Duration 8 weeks    PT Treatment/Interventions ADLs/Self Care Home Management;Patient/family education;Therapeutic exercise;Therapeutic activities;Orthotic Fit/Training;Manual lymph drainage;Compression bandaging;Passive range of motion;Joint Manipulations;Neuromuscular re-education;Manual techniques    PT Next Visit Plan check skin,  progress exeric=se gentle MLD montoring hematoma on chest. strengthening exercise    Consulted and Agree with Plan of Care Patient           Patient will benefit from skilled therapeutic intervention in order to improve the following deficits and impairments:  Postural dysfunction,Decreased knowledge of precautions,Impaired UE functional use,Pain,Increased edema,Decreased scar mobility,Decreased range of motion,Decreased activity tolerance,Decreased strength,Increased fascial restricitons,Decreased skin integrity,Decreased endurance,Increased muscle spasms  Visit Diagnosis: Aftercare following surgery for neoplasm  Stiffness of left shoulder, not elsewhere classified  Muscle weakness (generalized)  Lymphedema, not elsewhere classified  Abnormal posture     Problem List Patient Active Problem List   Diagnosis Date Noted  . History of left breast cancer 04/19/2020  . Genetic testing 03/29/2020  . Family history of breast cancer   . Family history of melanoma   . Family history of colon cancer   . Malignant neoplasm of upper-outer quadrant of left breast in female, estrogen receptor positive ([place]) 03/09/2020   . Family history of coronary artery disease 03/06/2019  . Endometrial cancer ([place]) 07/11/2018  . Obesity (BMI 35.0-39.9 without comorbidity) 07/11/2018  . Endometrial polyp 06/11/2018  . Post-menopausal bleeding 06/06/2018  . Hyperlipidemia 02/16/2016  . Hypothyroidism 02/16/2016  . Plantar fasciitis of right foot 12/16/2015  . Metatarsal deformity, right 12/16/2015  . Pronation deformity of ankle, acquired 12/16/2015   Donato Heinz. Owens Shark PT  Norwood Levo 06/01/2020, 5:55 PM  [place], [place], [address] Phone: [phone]   Fax:  [phone]  Name: ANGELITA HARNACK MRN: [id number] Date of Birth: November 01, 1945

## 2020-06-01 NOTE — Patient Instructions (Signed)
First of all, check with your insurance company to see if provider is in [place] (for wigs and compression sleeves / gloves/gauntlets )  [place], [place] [address] [phone]  Will file some insurances --- call for appointment   Second to [facility] (for mastectomy prosthetics and garments) [place], [place] [address] [phone] Will file some insurances --- call for appointment  [facility]  [address]  [place], [place] [address] [phone] Lower extremity garments  [facility] [place], [place]  [address] [place] [facility] (for compression garments, especilly for lower extremities) [address], [place], [place]  [address] [phone] Call for appointment    Jerrol Banana ,certified fitter [place]  [phone]  [employer] (for mastectomy supplies and garments) [place] [place], [place] [address] [phone]  Other Resources: National Lymphedema Network:  www.lymphnet.org www.Klosetraining.com for patient articles and self manual lymph drainage information [email or web address] has informative articles.  [email or web address]

## 2020-06-09 ENCOUNTER — Other Ambulatory Visit: Payer: Self-pay

## 2020-06-09 ENCOUNTER — Ambulatory Visit: Payer: Medicare PPO | Attending: Surgery | Admitting: Physical Therapy

## 2020-06-09 DIAGNOSIS — I890 Lymphedema, not elsewhere classified: Secondary | ICD-10-CM | POA: Insufficient documentation

## 2020-06-09 DIAGNOSIS — M6281 Muscle weakness (generalized): Secondary | ICD-10-CM | POA: Insufficient documentation

## 2020-06-09 DIAGNOSIS — R293 Abnormal posture: Secondary | ICD-10-CM | POA: Insufficient documentation

## 2020-06-09 DIAGNOSIS — Z483 Aftercare following surgery for neoplasm: Secondary | ICD-10-CM | POA: Insufficient documentation

## 2020-06-09 DIAGNOSIS — M25612 Stiffness of left shoulder, not elsewhere classified: Secondary | ICD-10-CM | POA: Insufficient documentation

## 2020-06-09 NOTE — Therapy (Signed)
[place], [place], [address] Phone: [phone]   Fax:  [phone]  Physical Therapy Treatment  Patient Details  Name: RUBY LOGIUDICE MRN: [id number] Date of Birth: June 24, 1945 Referring Provider (PT): Ninfa Linden   Encounter Date: 06/09/2020   PT End of Session - 06/09/20 1626    Visit Number 5    Number of Visits 18    Date for PT Re-Evaluation 07/18/20    PT Start Time 1400    PT Stop Time 1455    PT Time Calculation (min) 55 min    Activity Tolerance Patient tolerated treatment well           Past Medical History:  Diagnosis Date  . Breast cancer ([place]) 02/12/2020  . Diverticulosis   . Elevated blood pressure reading    165/71 a pre-op appt, reports she has seen her primary care in the last few months and intheir office it was in the 440H systolic, was not started on meds at that time, says her bp has "been creeping up since all this stuff started" , denies acute cardiac symptoms   . Family history of breast cancer   . Family history of colon cancer   . Family history of melanoma   . Hearing loss    wears hearing aids  . History of anemia as a child   . History of hiatal hernia   . Hypercholesteremia    diet controlled  . Hyperlipidemia 02/16/2016  . Hypothyroid   . Hypothyroidism 02/16/2016    Past Surgical History:  Procedure Laterality Date  . ABDOMINAL HYSTERECTOMY    . APPENDECTOMY    . BUNIONECTOMY    . CATARACT EXTRACTION    . CATARACT EXTRACTION, BILATERAL    . CESAREAN SECTION    . CHOLECYSTECTOMY    . DILATION AND CURETTAGE OF UTERUS    . EYE SURGERY    . FOOT SURGERY Right 2011   bone graft   . HYSTEROSCOPY WITH D & C N/A 06/11/2018   Procedure: DILATATION AND CURETTAGE /HYSTEROSCOPY WITH MYOSURE POLYPECTOMY;  Surgeon: Lavonia Drafts, MD;  Location: [facility];  Service: Gynecology;  Laterality: N/A;  . MASTECTOMY W/ SENTINEL NODE BIOPSY Left 04/19/2020   Procedure:  LEFT MASTECTOMY WITH SENTINEL LYMPH NODE BIOPSY;  Surgeon: Coralie Keens, MD;  Location: [facility];  Service: General;  Laterality: Left;  . NASAL SEPTUM SURGERY     at age 66  . ROBOTIC ASSISTED TOTAL HYSTERECTOMY WITH BILATERAL SALPINGO OOPHERECTOMY Bilateral 07/17/2018   Procedure: XI ROBOTIC ASSISTED TOTAL HYSTERECTOMY WITH BILATERAL SALPINGO OOPHORECTOMY AND LEFT PELVIC LYMPADECTOMY;  Surgeon: Everitt Amber, MD;  [facility];  Service: Gynecology;  Laterality: Bilateral;  . SENTINEL NODE BIOPSY N/A 07/17/2018   Procedure: SENTINEL NODE BIOPSY;  Surgeon: Everitt Amber, MD;  [facility];  Service: Gynecology;  Laterality: N/A;  . TONSILLECTOMY    . torn meniscus repair Right     There were no vitals filed for this visit.   Subjective Assessment - 06/09/20 1411    Subjective Pt had a lot of pain after last session but her arm is better now . She has been wearing the chip pack at side of her trunk and under the hematoma.  She is able to do her exercises at home now and  doing more house and yard work.    Pertinent History L breast cancer, ER+, plan is to undergo a L mastectomy and SLNB.  04/20/2020" left mastectomy with  deep axillay sentinel lymph node dissection 0/4 positive.    Patient Stated Goals to regain shoulder range of motion    Currently in Pain? No/denies                             [facility] Adult PT Treatment/Exercise - 06/09/20 0001      Shoulder Exercises: Supine   Flexion 5 reps;Right      Shoulder Exercises: Sidelying   External Rotation AROM;Left;5 reps    ABduction AROM;Left;5 reps   small range     Manual Therapy   Manual Lymphatic Drainage (MLD) in supine, deep breaths. Staionary circles toward right axilla along chest.  Holding one hand gently on hematoma below inciscion, stationary circles on abdomen and pt instructed to do same with mirror used as cues, then, in right sidelying stationary circles at left lateral chest and along back ,    softeneing noticd at hematoma site after hand placed on hematoma and stationary circles performed around it. pt was able to perceive that as well                   PT Short Term Goals - 05/18/20 1722      PT SHORT TERM GOAL #1   Title pt will be independent in a basic exericse program for shoulder ROM and strength    Time 4    Period Weeks    Status New      PT SHORT TERM GOAL #2   Title Pt will report the pain in her left upper quadrant is decreased to 2/10    Time 4    Period Weeks    Status New             PT Long Term Goals - 05/18/20 1723      PT LONG TERM GOAL #1   Title Pt will return to baseline shoulder ROM and not demonstrate any signs or symptoms of lymphedema.      PT LONG TERM GOAL #2   Title Pt will increase the number of sit to stand in 30 sec by 3 from initial measurment.    Time 8    Period Weeks    Status New      PT LONG TERM GOAL #3   Title Pt will decrease TUG score by 2 seconds from initial measurement    Time 8    Period Weeks    Status New                 Plan - 06/09/20 1627    Clinical Impression Statement Pt continues to have slow improvment.  She has increased pain after exercise session last time, so treatment focused today on MLD as she still has lot of congestion in front of chest at hematoma and lateral chest and back. Her incision is no longer draining, but it does appear to be fragile. She is getting improvement from chip pack especially at lateral chest. Her skin at anterior chest especially at incision appears too fragile to tolerate the incidental from the chip pack so she was instructed to avoid that area.    Personal Factors and Comorbidities Age;Comorbidity 2;Fitness    Comorbidities mastectomy, hematoma, diffuse muscle atrophy    Examination-Activity Limitations Reach Overhead;Dressing    Stability/Clinical Decision Making Evolving/Moderate complexity    Rehab Potential Good    PT Frequency 2x / week    PT  Duration 8 weeks  PT Treatment/Interventions ADLs/Self Care Home Management;Patient/family education;Therapeutic exercise;Therapeutic activities;Orthotic Fit/Training;Manual lymph drainage;Compression bandaging;Passive range of motion;Joint Manipulations;Neuromuscular re-education;Manual techniques    PT Next Visit Plan check skin,  progress exeric=se gentle MLD montoring hematoma on chest. strengthening exercise    Consulted and Agree with Plan of Care Patient           Patient will benefit from skilled therapeutic intervention in order to improve the following deficits and impairments:  Postural dysfunction,Decreased knowledge of precautions,Impaired UE functional use,Pain,Increased edema,Decreased scar mobility,Decreased range of motion,Decreased activity tolerance,Decreased strength,Increased fascial restricitons,Decreased skin integrity,Decreased endurance,Increased muscle spasms  Visit Diagnosis: Aftercare following surgery for neoplasm  Stiffness of left shoulder, not elsewhere classified  Muscle weakness (generalized)  Lymphedema, not elsewhere classified  Abnormal posture     Problem List Patient Active Problem List   Diagnosis Date Noted  . History of left breast cancer 04/19/2020  . Genetic testing 03/29/2020  . Family history of breast cancer   . Family history of melanoma   . Family history of colon cancer   . Malignant neoplasm of upper-outer quadrant of left breast in female, estrogen receptor positive ([place]) 03/09/2020  . Family history of coronary artery disease 03/06/2019  . Endometrial cancer ([place]) 07/11/2018  . Obesity (BMI 35.0-39.9 without comorbidity) 07/11/2018  . Endometrial polyp 06/11/2018  . Post-menopausal bleeding 06/06/2018  . Hyperlipidemia 02/16/2016  . Hypothyroidism 02/16/2016  . Plantar fasciitis of right foot 12/16/2015  . Metatarsal deformity, right 12/16/2015  . Pronation deformity of ankle, acquired 12/16/2015   Donato Heinz. Owens Shark  PT  Norwood Levo 06/09/2020, 4:31 PM  [place], [place], [address] Phone: [phone]   Fax:  [phone]  Name: EARNESTEEN BIRNIE MRN: [id number] Date of Birth: Feb 23, 1945

## 2020-06-15 ENCOUNTER — Other Ambulatory Visit: Payer: Self-pay

## 2020-06-15 ENCOUNTER — Encounter: Payer: Self-pay | Admitting: Physical Therapy

## 2020-06-15 ENCOUNTER — Ambulatory Visit: Payer: Medicare PPO | Admitting: Physical Therapy

## 2020-06-15 DIAGNOSIS — Z483 Aftercare following surgery for neoplasm: Secondary | ICD-10-CM | POA: Diagnosis not present

## 2020-06-15 DIAGNOSIS — I890 Lymphedema, not elsewhere classified: Secondary | ICD-10-CM

## 2020-06-15 DIAGNOSIS — M6281 Muscle weakness (generalized): Secondary | ICD-10-CM

## 2020-06-15 DIAGNOSIS — M25612 Stiffness of left shoulder, not elsewhere classified: Secondary | ICD-10-CM

## 2020-06-15 DIAGNOSIS — R293 Abnormal posture: Secondary | ICD-10-CM

## 2020-06-15 NOTE — Therapy (Signed)
[place], [place], [address] Phone: [phone]   Fax:  [phone]  Physical Therapy Treatment  Patient Details  Name: Elizabeth Maldonado MRN: [id number] Date of Birth: [birth year] Referring Provider (PT): Ninfa Linden   Encounter Date: 06/15/2020   PT End of Session - 06/15/20 1415     Visit Number 6    Number of Visits 18    Date for PT Re-Evaluation 07/18/20    PT Start Time 1300    PT Stop Time 5176    PT Time Calculation (min) 55 min    Activity Tolerance Patient tolerated treatment well    Behavior During Therapy [facility] for tasks assessed/performed             Past Medical History:  Diagnosis Date   Breast cancer ([place]) 02/12/2020   Diverticulosis    Elevated blood pressure reading    165/71 a pre-op appt, reports she has seen her primary care in the last few months and intheir office it was in the 160V systolic, was not started on meds at that time, says her bp has "been creeping up since all this stuff started" , denies acute cardiac symptoms    Family history of breast cancer    Family history of colon cancer    Family history of melanoma    Hearing loss    wears hearing aids   History of anemia as a child    History of hiatal hernia    Hypercholesteremia    diet controlled   Hyperlipidemia 02/16/2016   Hypothyroid    Hypothyroidism 02/16/2016    Past Surgical History:  Procedure Laterality Date   ABDOMINAL HYSTERECTOMY     APPENDECTOMY     BUNIONECTOMY     CATARACT EXTRACTION     CATARACT EXTRACTION, BILATERAL     CESAREAN SECTION     CHOLECYSTECTOMY     DILATION AND CURETTAGE OF UTERUS     EYE SURGERY     FOOT SURGERY Right 2011   bone graft    HYSTEROSCOPY WITH D & C N/A 06/11/2018   Procedure: DILATATION AND CURETTAGE /HYSTEROSCOPY WITH MYOSURE POLYPECTOMY;  Surgeon: Lavonia Drafts, MD;  Location: [facility];  Service: Gynecology;  Laterality: N/A;   MASTECTOMY W/ SENTINEL  NODE BIOPSY Left 04/19/2020   Procedure: LEFT MASTECTOMY WITH SENTINEL LYMPH NODE BIOPSY;  Surgeon: Coralie Keens, MD;  Location: [facility];  Service: General;  Laterality: Left;   NASAL SEPTUM SURGERY     at age 34   ROBOTIC [place] Bilateral 07/17/2018   Procedure: XI ROBOTIC ASSISTED TOTAL HYSTERECTOMY WITH BILATERAL SALPINGO OOPHORECTOMY AND LEFT PELVIC LYMPADECTOMY;  Surgeon: Everitt Amber, MD;  [facility];  Service: Gynecology;  Laterality: Bilateral;   SENTINEL NODE BIOPSY N/A 07/17/2018   Procedure: SENTINEL NODE BIOPSY;  Surgeon: Everitt Amber, MD;  [facility];  Service: Gynecology;  Laterality: N/A;   TONSILLECTOMY     torn meniscus repair Right     There were no vitals filed for this visit.   Subjective Assessment - 06/15/20 1406     Subjective Pt saw Dr. Ninfa Linden on Friday and she doesn't have to go back for a  year.He  said that the skin is really stretched because of the hematoma and that it should just keep getting better on its own.   She feels that her shoulder is totally OK now.  She reports she can tell that the hematoma  is softer and that the compression pads help. She goes to [place] on July 13    Pertinent History L breast cancer, ER+, plan is to undergo a L mastectomy and SLNB.  04/20/2020" left mastectomy with deep axillay sentinel lymph node dissection 0/4 positive.    Currently in Pain? No/denies                [facility] PT Assessment - 06/15/20 0001       AROM   Left Shoulder Flexion 155 Degrees    Left Shoulder ABduction 160 Degrees                           [facility] Adult PT Treatment/Exercise - 06/15/20 0001       Self-Care   Self-Care Other Self-Care Comments    Other Self-Care Comments  showed pt samples of the Prairiewear Prima with extender and vida. Pt liked them and feels like that would help her.      Exercises   Exercises Shoulder    Other Exercises  neck  and scapular circles      Shoulder Exercises: Sidelying   ABduction AROM;Left   stretch overhead with deep breaths   Other Sidelying Exercises small circles with hand pointed to ceiling      Manual Therapy   Manual Therapy Manual Lymphatic Drainage (MLD);Passive ROM    Manual therapy comments gave pt loose chip pack in tg soft to wear inside bra at hematoma site with instructions to monitor for drainage    Manual Lymphatic Drainage (MLD) in supine, deep breaths, short neck. left axillary nodes and anterior interaxillary anatamosis, left chest with gentle pressure on the hematoma, left inguinal nodes. left abdominal area and lateral chest, then to right sidelying for posterior interaxillary anastamosis and more work on back and lateral chest                      PT Short Term Goals - 06/15/20 1421       PT SHORT TERM GOAL #1   Title pt will be independent in a basic exericse program for shoulder ROM and strength    Status Achieved      PT SHORT TERM GOAL #2   Title Pt will report the pain in her left upper quadrant is decreased to 2/10    Status Achieved               PT Long Term Goals - 06/15/20 1421       PT LONG TERM GOAL #1   Title Pt will return to baseline shoulder ROM and not demonstrate any signs or symptoms of lymphedema.    Baseline 06/15/2020 Pt with lymphedema in her anterior and lateral chest    Time 8    Period Weeks    Status On-going      PT LONG TERM GOAL #2   Title Pt will increase the number of sit to stand in 30 sec by 3 from initial measurment.    Time 8    Status On-going      PT LONG TERM GOAL #3   Title Pt will decrease TUG score by 2 seconds from initial measurement    Time 8    Period Weeks    Status On-going                   Plan - 06/15/20 1416     Clinical Impression  Statement Pt continues to improve and can tell that her hematoma is softening and she is feels that her arm has returned to normal.  Her pain is  much reduced. She got benefit from MLD last session and wants to continue with that. She was happy to see the samples of the Prairiewear bra and is looking forward to her appointment to get one.  Pt wants to learn more exercises next session    Personal Factors and Comorbidities Age;Comorbidity 2;Fitness    Comorbidities mastectomy, hematoma, diffuse muscle atrophy    Examination-Activity Limitations Reach Overhead;Dressing    PT Next Visit Plan strengthening exercise (row,squat, overhead shoulder to cabinet, one leg stance/ hip abduction) continue with MLD, schedule another appt in a few weeks or after July 13 so we can check the bra ??    Consulted and Agree with Plan of Care Patient             Patient will benefit from skilled therapeutic intervention in order to improve the following deficits and impairments:  Postural dysfunction, Decreased knowledge of precautions, Impaired UE functional use, Pain, Increased edema, Decreased scar mobility, Decreased range of motion, Decreased activity tolerance, Decreased strength, Increased fascial restricitons, Decreased skin integrity, Decreased endurance, Increased muscle spasms  Visit Diagnosis: Aftercare following surgery for neoplasm  Stiffness of left shoulder, not elsewhere classified  Muscle weakness (generalized)  Lymphedema, not elsewhere classified  Abnormal posture     Problem List Patient Active Problem List   Diagnosis Date Noted   History of left breast cancer 04/19/2020   Genetic testing 03/29/2020   Family history of breast cancer    Family history of melanoma    Family history of colon cancer    Malignant neoplasm of upper-outer quadrant of left breast in female, estrogen receptor positive ([place]) 03/09/2020   Family history of coronary artery disease 03/06/2019   Endometrial cancer ([place]) 07/11/2018   Obesity (BMI 35.0-39.9 without comorbidity) 07/11/2018   Endometrial polyp 06/11/2018   Post-menopausal bleeding  06/06/2018   Hyperlipidemia 02/16/2016   Hypothyroidism 02/16/2016   Plantar fasciitis of right foot 12/16/2015   Metatarsal deformity, right 12/16/2015   Pronation deformity of ankle, acquired 12/16/2015   Donato Heinz. Owens Shark PT  Norwood Levo 06/15/2020, 2:23 PM  [place], [place], [address] Phone: [phone]   Fax:  [phone]  Name: Elizabeth Maldonado MRN: [id number] Date of Birth: 04-Jan-1945

## 2020-06-16 ENCOUNTER — Ambulatory Visit: Payer: Medicare PPO | Admitting: Oncology

## 2020-06-17 ENCOUNTER — Encounter: Payer: Medicare PPO | Admitting: Physical Therapy

## 2020-07-14 DIAGNOSIS — C50912 Malignant neoplasm of unspecified site of left female breast: Secondary | ICD-10-CM | POA: Diagnosis not present

## 2020-07-26 ENCOUNTER — Other Ambulatory Visit: Payer: Self-pay

## 2020-07-26 ENCOUNTER — Ambulatory Visit: Payer: Medicare PPO | Attending: Surgery

## 2020-07-26 VITALS — Wt 197.0 lb

## 2020-07-26 DIAGNOSIS — Z483 Aftercare following surgery for neoplasm: Secondary | ICD-10-CM | POA: Insufficient documentation

## 2020-07-26 DIAGNOSIS — M25612 Stiffness of left shoulder, not elsewhere classified: Secondary | ICD-10-CM | POA: Insufficient documentation

## 2020-07-26 DIAGNOSIS — C50412 Malignant neoplasm of upper-outer quadrant of left female breast: Secondary | ICD-10-CM | POA: Insufficient documentation

## 2020-07-26 DIAGNOSIS — Z170 Estrogen receptor positive status [ER+]: Secondary | ICD-10-CM | POA: Insufficient documentation

## 2020-07-26 DIAGNOSIS — R293 Abnormal posture: Secondary | ICD-10-CM | POA: Insufficient documentation

## 2020-07-26 DIAGNOSIS — M6281 Muscle weakness (generalized): Secondary | ICD-10-CM | POA: Insufficient documentation

## 2020-07-26 NOTE — Therapy (Signed)
[place], [place], [address] Phone: [phone]   Fax:  [phone]  Physical Therapy Treatment  Patient Details  Name: Elizabeth Maldonado MRN: [id number] Date of Birth: March 03, 1945 Referring Provider (PT): Ninfa Linden   Encounter Date: 07/26/2020   PT End of Session - 07/26/20 1427     Visit Number 6   # unchanged due to screen only   PT Start Time 1418    PT Stop Time 1427    PT Time Calculation (min) 9 min    Activity Tolerance Patient tolerated treatment well    Behavior During Therapy [facility] for tasks assessed/performed             Past Medical History:  Diagnosis Date   Breast cancer ([place]) 02/12/2020   Diverticulosis    Elevated blood pressure reading    165/71 a pre-op appt, reports she has seen her primary care in the last few months and intheir office it was in the 0000000 systolic, was not started on meds at that time, says her bp has "been creeping up since all this stuff started" , denies acute cardiac symptoms    Family history of breast cancer    Family history of colon cancer    Family history of melanoma    Hearing loss    wears hearing aids   History of anemia as a child    History of hiatal hernia    Hypercholesteremia    diet controlled   Hyperlipidemia 02/16/2016   Hypothyroid    Hypothyroidism 02/16/2016    Past Surgical History:  Procedure Laterality Date   ABDOMINAL HYSTERECTOMY     APPENDECTOMY     BUNIONECTOMY     CATARACT EXTRACTION     CATARACT EXTRACTION, BILATERAL     CESAREAN SECTION     CHOLECYSTECTOMY     DILATION AND CURETTAGE OF UTERUS     EYE SURGERY     FOOT SURGERY Right 2011   bone graft    HYSTEROSCOPY WITH D & C N/A 06/11/2018   Procedure: DILATATION AND CURETTAGE /HYSTEROSCOPY WITH MYOSURE POLYPECTOMY;  Surgeon: Lavonia Drafts, MD;  Location: [place];  Service: Gynecology;  Laterality: N/A;   MASTECTOMY W/ SENTINEL NODE BIOPSY Left 04/19/2020    Procedure: LEFT MASTECTOMY WITH SENTINEL LYMPH NODE BIOPSY;  Surgeon: Coralie Keens, MD;  Location: [place];  Service: General;  Laterality: Left;   NASAL SEPTUM SURGERY     at age 61   ROBOTIC [place] Bilateral 07/17/2018   Procedure: XI ROBOTIC ASSISTED TOTAL HYSTERECTOMY WITH BILATERAL SALPINGO OOPHORECTOMY AND LEFT PELVIC LYMPADECTOMY;  Surgeon: Everitt Amber, MD;  [facility];  Service: Gynecology;  Laterality: Bilateral;   SENTINEL NODE BIOPSY N/A 07/17/2018   Procedure: SENTINEL NODE BIOPSY;  Surgeon: Everitt Amber, MD;  [facility];  Service: Gynecology;  Laterality: N/A;   TONSILLECTOMY     torn meniscus repair Right     Vitals:   07/26/20 1422  Weight: 197 lb (89.4 kg)     Subjective Assessment - 07/26/20 1422     Subjective Pt returns for her 3 month L-Dex screen.    Pertinent History L breast cancer, ER+, plan is to undergo a L mastectomy and SLNB.  04/20/2020" left mastectomy with deep axillay sentinel lymph node dissection 0/4 positive.                    L-DEX FLOWSHEETS - 07/26/20 1400  L-DEX LYMPHEDEMA SCREENING   Measurement Type Unilateral    L-DEX MEASUREMENT EXTREMITY Upper Extremity    POSITION  Standing    DOMINANT SIDE Left    At Risk Side Left    BASELINE SCORE (UNILATERAL) -3.9    L-DEX SCORE (UNILATERAL) -3.9    VALUE CHANGE (UNILAT) 0                                 PT Short Term Goals - 06/15/20 1421       PT SHORT TERM GOAL #1   Title pt will be independent in a basic exericse program for shoulder ROM and strength    Status Achieved      PT SHORT TERM GOAL #2   Title Pt will report the pain in her left upper quadrant is decreased to 2/10    Status Achieved               PT Long Term Goals - 06/15/20 1421       PT LONG TERM GOAL #1   Title Pt will return to baseline shoulder ROM and not demonstrate any signs or symptoms of  lymphedema.    Baseline 06/15/2020 Pt with lymphedema in her anterior and lateral chest    Time 8    Period Weeks    Status On-going      PT LONG TERM GOAL #2   Title Pt will increase the number of sit to stand in 30 sec by 3 from initial measurment.    Time 8    Status On-going      PT LONG TERM GOAL #3   Title Pt will decrease TUG score by 2 seconds from initial measurement    Time 8    Period Weeks    Status On-going                   Plan - 07/26/20 1427     Clinical Impression Statement Pt returns for her 3 month L-Dex screen. Her change from baseline of 0 is WNLs so no further treatment is required at this time except to cont every 3 month L-Dex screens which pt is agreeable to.    PT Next Visit Plan Cont every 3 month L-Dex screens from her SLNB.    Consulted and Agree with Plan of Care Patient             Patient will benefit from skilled therapeutic intervention in order to improve the following deficits and impairments:     Visit Diagnosis: Aftercare following surgery for neoplasm     Problem List Patient Active Problem List   Diagnosis Date Noted   History of left breast cancer 04/19/2020   Genetic testing 03/29/2020   Family history of breast cancer    Family history of melanoma    Family history of colon cancer    Malignant neoplasm of upper-outer quadrant of left breast in female, estrogen receptor positive ([place]) 03/09/2020   Family history of coronary artery disease 03/06/2019   Endometrial cancer ([place]) 07/11/2018   Obesity (BMI 35.0-39.9 without comorbidity) 07/11/2018   Endometrial polyp 06/11/2018   Post-menopausal bleeding 06/06/2018   Hyperlipidemia 02/16/2016   Hypothyroidism 02/16/2016   Plantar fasciitis of right foot 12/16/2015   Metatarsal deformity, right 12/16/2015   Pronation deformity of ankle, acquired 12/16/2015    Otelia Limes, PTA 07/26/2020, 2:29 PM  [place]  [place], [place], [address] Phone: [phone]   Fax:  [phone]  Name: Elizabeth Maldonado MRN: [id number] Date of Birth: [birth year]

## 2020-07-27 ENCOUNTER — Ambulatory Visit: Payer: Medicare PPO | Admitting: Physical Therapy

## 2020-07-27 DIAGNOSIS — M6281 Muscle weakness (generalized): Secondary | ICD-10-CM | POA: Diagnosis not present

## 2020-07-27 DIAGNOSIS — C50912 Malignant neoplasm of unspecified site of left female breast: Secondary | ICD-10-CM | POA: Diagnosis not present

## 2020-07-27 DIAGNOSIS — R293 Abnormal posture: Secondary | ICD-10-CM | POA: Diagnosis not present

## 2020-07-27 DIAGNOSIS — C50412 Malignant neoplasm of upper-outer quadrant of left female breast: Secondary | ICD-10-CM | POA: Diagnosis not present

## 2020-07-27 DIAGNOSIS — Z483 Aftercare following surgery for neoplasm: Secondary | ICD-10-CM

## 2020-07-27 DIAGNOSIS — M25612 Stiffness of left shoulder, not elsewhere classified: Secondary | ICD-10-CM

## 2020-07-27 DIAGNOSIS — Z170 Estrogen receptor positive status [ER+]: Secondary | ICD-10-CM

## 2020-07-27 NOTE — Therapy (Signed)
[place], [place], [address] Phone: [phone]   Fax:  [phone]  Physical Therapy Treatment  Patient Details  Name: Elizabeth Maldonado MRN: [id number] Date of Birth: 1945/05/26 Referring Provider (PT): Ninfa Linden   Encounter Date: 07/27/2020   PT End of Session - 07/27/20 8016     Visit Number 7    Number of Visits 18    Date for PT Re-Evaluation 07/18/20    PT Start Time 1500    PT Stop Time 1540    PT Time Calculation (min) 40 min    Activity Tolerance Patient tolerated treatment well             Past Medical History:  Diagnosis Date   Breast cancer ([place]) 02/12/2020   Diverticulosis    Elevated blood pressure reading    165/71 a pre-op appt, reports she has seen her primary care in the last few months and intheir office it was in the 553Z systolic, was not started on meds at that time, says her bp has "been creeping up since all this stuff started" , denies acute cardiac symptoms    Family history of breast cancer    Family history of colon cancer    Family history of melanoma    Hearing loss    wears hearing aids   History of anemia as a child    History of hiatal hernia    Hypercholesteremia    diet controlled   Hyperlipidemia 02/16/2016   Hypothyroid    Hypothyroidism 02/16/2016    Past Surgical History:  Procedure Laterality Date   ABDOMINAL HYSTERECTOMY     APPENDECTOMY     BUNIONECTOMY     CATARACT EXTRACTION     CATARACT EXTRACTION, BILATERAL     CESAREAN SECTION     CHOLECYSTECTOMY     DILATION AND CURETTAGE OF UTERUS     EYE SURGERY     FOOT SURGERY Right 2011   bone graft    HYSTEROSCOPY WITH D & C N/A 06/11/2018   Procedure: DILATATION AND CURETTAGE /HYSTEROSCOPY WITH MYOSURE POLYPECTOMY;  Surgeon: Lavonia Drafts, MD;  Location: [facility];  Service: Gynecology;  Laterality: N/A;   MASTECTOMY W/ SENTINEL NODE BIOPSY Left 04/19/2020   Procedure: LEFT MASTECTOMY WITH  SENTINEL LYMPH NODE BIOPSY;  Surgeon: Coralie Keens, MD;  Location: [facility];  Service: General;  Laterality: Left;   NASAL SEPTUM SURGERY     at age 56   ROBOTIC [place] Bilateral 07/17/2018   Procedure: XI ROBOTIC ASSISTED TOTAL HYSTERECTOMY WITH BILATERAL SALPINGO OOPHORECTOMY AND LEFT PELVIC LYMPADECTOMY;  Surgeon: Everitt Amber, MD;  Location: [facility];  Service: Gynecology;  Laterality: Bilateral;   SENTINEL NODE BIOPSY N/A 07/17/2018   Procedure: SENTINEL NODE BIOPSY;  Surgeon: Everitt Amber, MD;  Location[facility];  Service: Gynecology;  Laterality: N/A;   TONSILLECTOMY     torn meniscus repair Right     There were no vitals filed for this visit.   Subjective Assessment - 07/27/20 1550     Subjective Pt states she is doing very well.  She is using her compression bra and has a breast prosthesis that she wears but she thinks it is too big.She reports she is able to do everything she wants to do. She is here for a final visit to learn exercises she can do at home. She has to miss her last scheduled visit because she tested positive for Covid.  Pertinent History L breast cancer, ER+, plan is to undergo a L mastectomy and SLNB.  04/20/2020" left mastectomy with deep axillay sentinel lymph node dissection 0/4 positive.    Patient Stated Goals to regain shoulder range of motion  07/27/2020: pt feels she has regained her shoulder ROM    Currently in Pain? No/denies                [facility] PT Assessment - 07/27/20 0001       Assessment   Medical Diagnosis L breast cancer    Referring Provider (PT) Ninfa Linden      Observation/Other Assessments   Observations Pt has healing hematoma on left chest. The incision is closed though it still has some dark scabs along incision line.  It is reduced in size, but still has some congestion with firmness to palpation. Pt encouraged to continue wearing her chip pack and compression bra around this area  to help it reduce over time      AROM   Left Shoulder Flexion 160 Degrees    Left Shoulder ABduction 160 Degrees                           [facility] Adult PT Treatment/Exercise - 07/27/20 0001       Self-Care   Self-Care Other Self-Care Comments    Other Self-Care Comments  gave pt information about [employer] exercise programs      Exercises   Exercises Other Exercises    Other Exercises  instructed in exercises as outlined in patient instructions section 3 rounds with 3 pounds for 5 exercises. she was able to do them without difficulty                    PT Education - 07/27/20 1715     Education Details gradually prgressive resistive exercise program    Person(s) Educated Patient    Methods Explanation;Demonstration    Comprehension Verbalized understanding;Returned demonstration              PT Short Term Goals - 07/27/20 1723       PT SHORT TERM GOAL #1   Title pt will be independent in a basic exericse program for shoulder ROM and strength    Status Achieved      PT SHORT TERM GOAL #2   Title Pt will report the pain in her left upper quadrant is decreased to 2/10    Status Achieved               PT Long Term Goals - 07/27/20 1723       PT LONG TERM GOAL #1   Title Pt will return to baseline shoulder ROM and not demonstrate any signs or symptoms of lymphedema.    Baseline 06/15/2020 Pt with lymphedema in her anterior and lateral chest, 07/27/2020 hematoma continues to heal, but has some congestion    Status Partially Met      PT LONG TERM GOAL #2   Title Pt will increase the number of sit to stand in 30 sec by 3 from initial measurment.    Baseline pt reports she is doing well    Status Deferred      PT LONG TERM GOAL #3   Title Pt will decrease TUG score by 2 seconds from initial measurement    Baseline pt states she is doing well    Status Deferred  Plan - 07/27/20 1717     Clinical  Impression Statement Pt is doing well. She has regained her shoulder ROM and reports she is able to do everything she wants to do. She is wearing her compression bra with chip pack and does have a mastectomy bra with prothesis and says that her hematoma continues to reduce. She no longer has any pain. She was instructed in an abbreviated gradually progressive resistive exercise program that she will continue at home for general fitness and to decrease her lymphedema risk. She will continue to be followed for 3 month SOZO check ups, but this episode of PT will be discharged.    Personal Factors and Comorbidities Age;Comorbidity 2;Fitness    Comorbidities mastectomy, hematoma, diffuse muscle atrophy    Examination-Activity Limitations Reach Overhead;Dressing    PT Next Visit Plan Cont every 3 month L-Dex screens from her SLNB.    Consulted and Agree with Plan of Care Patient             Patient will benefit from skilled therapeutic intervention in order to improve the following deficits and impairments:  Postural dysfunction, Decreased knowledge of precautions, Impaired UE functional use, Pain, Increased edema, Decreased scar mobility, Decreased range of motion, Decreased activity tolerance, Decreased strength, Increased fascial restricitons, Decreased skin integrity, Decreased endurance, Increased muscle spasms  Visit Diagnosis: Aftercare following surgery for neoplasm  Stiffness of left shoulder, not elsewhere classified  Muscle weakness (generalized)  Abnormal posture  Malignant neoplasm of upper-outer quadrant of left breast in female, estrogen receptor positive ([place])     Problem List Patient Active Problem List   Diagnosis Date Noted   History of left breast cancer 04/19/2020   Genetic testing 03/29/2020   Family history of breast cancer    Family history of melanoma    Family history of colon cancer    Malignant neoplasm of upper-outer quadrant of left breast in female,  estrogen receptor positive ([place]) 03/09/2020   Family history of coronary artery disease 03/06/2019   Endometrial cancer ([place]) 07/11/2018   Obesity (BMI 35.0-39.9 without comorbidity) 07/11/2018   Endometrial polyp 06/11/2018   Post-menopausal bleeding 06/06/2018   Hyperlipidemia 02/16/2016   Hypothyroidism 02/16/2016   Plantar fasciitis of right foot 12/16/2015   Metatarsal deformity, right 12/16/2015   Pronation deformity of ankle, acquired 12/16/2015   PHYSICAL THERAPY DISCHARGE SUMMARY  Visits from Start of Care: 7  Current functional level related to goals / functional outcomes: independent   Remaining deficits: Healing hematoma on chest with some lymphatic congestion   Education / Equipment: Self manual lymph drainage, use of compression, home exercise   Patient agrees to discharge. Patient goals were partially met. Patient is being discharged due to being pleased with the current functional level.  Donato Heinz. Owens Shark PT  Norwood Levo 07/27/2020, 5:25 PM  [place], [place], [address] Phone: [phone]   Fax:  [phone]  Name: Elizabeth Maldonado MRN: [id number] Date of Birth: January 03, 1946

## 2020-07-27 NOTE — Patient Instructions (Signed)
Access Code: [email or web address] URL: [email or web address] Date: 07/27/2020 Prepared by: Maudry Diego  Program Notes only have to do this 2or 3 times a week start with  3 rounds of 3 reps with 2 pound weights and slowly work your way up to 5 rounds of 5 reps before increasing the weight.  When you feel like you can increase to 5 pounds, back down to 3rounds of 3 reps, and increase again. Progress in that manner. Consider holding 5 and 2 in one hand to get 7 pounds.  Do all the exercises with one arm at a time even though the picture shows 2 arms.  Later, when you have built your strength and stamina you can do them with 2 arms, but make sure you always keep your core tight.    Exercises Standing Deadlift with Barbell - Knees Straight - 1 x daily - 2 x weekly - 5 sets - 5 reps Standing Bent Over Single Arm Shoulder Row with Dumbbells - 1 x daily - 2 x weekly - 5 sets - 5 reps Standing Single Arm Bicep Curls Neutral with Dumbbell - 1 x daily - 2 x weekly - 5 sets - 5 reps Dumbbell Squat at Shoulders - 1 x daily - 2 x weekly - 5 sets - 5 reps Shoulder Overhead Press in Flexion with Dumbbells - 1 x daily - 2 x weekly - 5 sets - 15 reps

## 2020-07-29 DIAGNOSIS — R7303 Prediabetes: Secondary | ICD-10-CM | POA: Diagnosis not present

## 2020-07-29 DIAGNOSIS — I700 Atherosclerosis of aorta: Secondary | ICD-10-CM | POA: Diagnosis not present

## 2020-07-29 DIAGNOSIS — E785 Hyperlipidemia, unspecified: Secondary | ICD-10-CM | POA: Diagnosis not present

## 2020-07-29 DIAGNOSIS — C50912 Malignant neoplasm of unspecified site of left female breast: Secondary | ICD-10-CM | POA: Diagnosis not present

## 2020-07-29 DIAGNOSIS — F419 Anxiety disorder, unspecified: Secondary | ICD-10-CM | POA: Diagnosis not present

## 2020-07-29 DIAGNOSIS — E039 Hypothyroidism, unspecified: Secondary | ICD-10-CM | POA: Diagnosis not present

## 2020-08-19 ENCOUNTER — Other Ambulatory Visit: Payer: Self-pay | Admitting: Oncology

## 2020-08-19 DIAGNOSIS — C50412 Malignant neoplasm of upper-outer quadrant of left female breast: Secondary | ICD-10-CM

## 2020-08-19 DIAGNOSIS — Z170 Estrogen receptor positive status [ER+]: Secondary | ICD-10-CM

## 2020-08-27 ENCOUNTER — Inpatient Hospital Stay: Payer: Medicare PPO | Attending: Gynecologic Oncology | Admitting: Gynecologic Oncology

## 2020-08-27 ENCOUNTER — Other Ambulatory Visit: Payer: Self-pay

## 2020-08-27 ENCOUNTER — Encounter: Payer: Self-pay | Admitting: Gynecologic Oncology

## 2020-08-27 VITALS — BP 155/89 | HR 82 | Temp 98.7°F | Resp 16 | Ht 62.5 in | Wt 197.0 lb

## 2020-08-27 DIAGNOSIS — C50912 Malignant neoplasm of unspecified site of left female breast: Secondary | ICD-10-CM | POA: Diagnosis not present

## 2020-08-27 DIAGNOSIS — Z8542 Personal history of malignant neoplasm of other parts of uterus: Secondary | ICD-10-CM | POA: Diagnosis not present

## 2020-08-27 DIAGNOSIS — Z79811 Long term (current) use of aromatase inhibitors: Secondary | ICD-10-CM | POA: Insufficient documentation

## 2020-08-27 DIAGNOSIS — Z90710 Acquired absence of both cervix and uterus: Secondary | ICD-10-CM | POA: Diagnosis not present

## 2020-08-27 DIAGNOSIS — Z90722 Acquired absence of ovaries, bilateral: Secondary | ICD-10-CM | POA: Diagnosis not present

## 2020-08-27 DIAGNOSIS — C541 Malignant neoplasm of endometrium: Secondary | ICD-10-CM

## 2020-08-27 NOTE — Patient Instructions (Signed)
Please notify Dr Denman George at phone number [phone] if you notice vaginal bleeding, new pelvic or abdominal pains, bloating, feeling full easy, or a change in bladder or bowel function.   Dr Denman George is departing the [place] [facility] in October, 2022. Her partners and colleagues including Dr Berline Lopes, Dr Delsa Sale and Joylene John, Nurse Practitioner will be available to continue your care.   You are next scheduled to return to the Gynecologic Oncology office at the [facility] in August, 2023. Please call [phone] in April, 2023 to request an appointment for August with Dr [name] partner, Dr Delsa Sale.

## 2020-08-27 NOTE — Progress Notes (Signed)
Follow-up Note: Gyn-Onc  Consult was initially  requested by Dr. Ihor Dow for the evaluation of CARMINE ESHLEMAN 75 y.o. female  CC:  Chief Complaint  Patient presents with   Endometrial cancer [facility])    Assessment/Plan:  Ms. ADELADE OSTERMEIER  is a 75 y.o.  year old with a history of stage IA grade 1 endometrioid endometrial cancer s/p robotic staging procedure on 07/16/18.  Pathology revealed low risk factors for recurrence, therefore no adjuvant therapy  recommended according to NCCN guidelines.  I discussed risk for recurrence and typical symptoms encouraged her to notify us of these should they develop between visits.  She was recommended to follow-up every 6 months for 5 years in accordance with NCCN guidelines, however, she declined visits at this frequency and enquired regarding spacing them to annual. I expressed that I felt that was reasonable so long as she was aware to contact my office should symptoms arise before then.  She continue to will follow-up with Dr Delsa Sale for 12 monthly evaluations.   HPI: Ms Khadijha Garraway is a 75 year old P1 who is seen in consultation at the request of Dr Ihor Dow for a grade 1 endometrial adenocarcinoma.   Patient reports postmenopausal bleeding on Mother's Day of 2020.  This prompted her to discuss it with her primary care physician, Dr. Tamala Julian, followed by a consultation with Dr. Ihor Dow.  Dr. Ihor Dow performed a transvaginal ultrasound scan on June 05, 2018.  This revealed a uterus measuring 6.5 x 4 x 4.6 cm.  The endometrium measured 24 mm and contained a markedly heterogeneous endometrial complex with multiple cystic areas.  The left and right ovaries were not visualized.  An office sampling was attempted but was unsuccessful due to discomfort.  The patient was taken to the operating room on June 11, 2018 where a D&C was performed.  This was accompanied by a hysteroscopy and MyoSure polypectomy.  Intraoperative findings were  significant for a large endometrial polyp.  Final pathology from this procedure revealed extensive complex atypical hyperplasia with foci suspicious for a well differentiated endometrioid adenocarcinoma.  On 07/16/18 she underwent robotic assisted total hysterectomy, BSO, SLN biopsy and left pelvic lymphadenectomy. Final pathology showed no residual carcinoma in the hysterectomy specimen.   She was assigned stage IA grade 1 endometrial cancer with low risk features. No adjuvant therapy was recommended based on final pathology and NCCN guidelines.   Interval Hx:  She has no symptoms concerning for recurrence and no complaints. She developed a diagnosis of multifocal early stage left breast cancer in 2022 which was treated with left mastectomy, SLN biopsy and postoperative anti-estrogen oral therapy.  Current Meds:  Outpatient Encounter Medications as of 08/27/2020  Medication Sig   anastrozole (ARIMIDEX) 1 MG tablet Take 1 tablet (1 mg total) by mouth daily.   b complex vitamins tablet Take 1 tablet by mouth daily with supper.   Calcium Carb-Cholecalciferol 500-400 MG-UNIT TABS Take 1 tablet by mouth daily with supper.   Calcium Citrate-Vitamin D (CALCIUM + D PO) Take 1 tablet by mouth daily.   Cholecalciferol (VITAMIN D) 125 MCG (5000 UT) CAPS Take 5,000 Units by mouth daily with supper.   Coenzyme Q10 (COQ10) 200 MG CAPS Take 200 mg by mouth daily with supper.   levothyroxine (SYNTHROID, LEVOTHROID) 75 MCG tablet Take 75 mcg by mouth daily before breakfast.   Multiple Vitamin (MULTIVITAMIN WITH MINERALS) TABS tablet Take 1 tablet by mouth daily with supper.   REPATHA SURECLICK [id number] MG/ML SOAJ Inject  140 mg into the skin every 14 (fourteen) days.   gabapentin (NEURONTIN) 300 MG capsule Take 1 capsule (300 mg total) by mouth at bedtime. (Patient not taking: Reported on 08/27/2020)   oxyCODONE (OXY IR/ROXICODONE) 5 MG immediate release tablet Take 1 tablet (5 mg total) by mouth every 6 (six) hours  as needed for moderate pain or severe pain. (Patient not taking: Reported on 08/27/2020)   Tetrahydrozoline HCl (VISINE OP) Place 1 drop into both eyes daily as needed (irritation/itchy eyes). (Patient not taking: Reported on 08/27/2020)   vitamin C (ASCORBIC ACID) 500 MG tablet Take 500 mg by mouth daily with supper. (Patient not taking: Reported on 08/27/2020)   No facility-administered encounter medications on file as of 08/27/2020.    Allergy:  Allergies  Allergen Reactions   Crestor [Rosuvastatin] Other (See Comments)    myalgia   Lipitor [Atorvastatin] Other (See Comments)    myalgia   Colestipol Hcl Other (See Comments)   Acetaminophen Itching and Other (See Comments)    "hyperactivity"   Vytorin [Ezetimibe-Simvastatin] Other (See Comments)    myalgia    Social Hx:   Social History   Socioeconomic History   Marital status: Married    Spouse name: Not on file   Number of children: Not on file   Years of education: Not on file   Highest education level: Not on file  Occupational History   Not on file  Tobacco Use   Smoking status: Never   Smokeless tobacco: Never  Vaping Use   Vaping Use: Never used  Substance and Sexual Activity   Alcohol use: Not Currently    Comment: one to two beers a year   Drug use: No   Sexual activity: Not on file  Other Topics Concern   Not on file  Social History Narrative   Not on file   Social Determinants of Health   Financial Resource Strain: Not on file  Food Insecurity: Not on file  Transportation Needs: Not on file  Physical Activity: Not on file  Stress: Not on file  Social Connections: Not on file  Intimate Partner Violence: Not At Risk   Fear of Current or Ex-Partner: No   Emotionally Abused: No   Physically Abused: No   Sexually Abused: No    Past Surgical Hx:  Past Surgical History:  Procedure Laterality Date   ABDOMINAL HYSTERECTOMY     APPENDECTOMY     BUNIONECTOMY     CATARACT EXTRACTION     CATARACT  EXTRACTION, BILATERAL     CESAREAN SECTION     CHOLECYSTECTOMY     DILATION AND CURETTAGE OF UTERUS     EYE SURGERY     FOOT SURGERY Right 2011   bone graft    HYSTEROSCOPY WITH D & C N/A 06/11/2018   Procedure: DILATATION AND CURETTAGE /HYSTEROSCOPY WITH MYOSURE POLYPECTOMY;  Surgeon: Lavonia Drafts, MD;  Location: [facility];  Service: Gynecology;  Laterality: N/A;   MASTECTOMY W/ SENTINEL NODE BIOPSY Left 04/19/2020   Procedure: LEFT MASTECTOMY WITH SENTINEL LYMPH NODE BIOPSY;  Surgeon: Coralie Keens, MD;  Location: [facility];  Service: General;  Laterality: Left;   NASAL SEPTUM SURGERY     at age 67   ROBOTIC [place] Bilateral 07/17/2018   Procedure: XI ROBOTIC ASSISTED TOTAL HYSTERECTOMY WITH BILATERAL SALPINGO OOPHORECTOMY AND LEFT PELVIC LYMPADECTOMY;  Surgeon: Everitt Amber, MD;  [facility];  Service: Gynecology;  Laterality: Bilateral;   SENTINEL NODE BIOPSY  N/A 07/17/2018   Procedure: SENTINEL NODE BIOPSY;  Surgeon: Everitt Amber, MD;  [facility];  Service: Gynecology;  Laterality: N/A;   TONSILLECTOMY     torn meniscus repair Right     Past Medical Hx:  Past Medical History:  Diagnosis Date   Breast cancer ([place]) 02/12/2020   Diverticulosis    Elevated blood pressure reading    165/71 a pre-op appt, reports she has seen her primary care in the last few months and intheir office it was in the 0000000 systolic, was not started on meds at that time, says her bp has "been creeping up since all this stuff started" , denies acute cardiac symptoms    Family history of breast cancer    Family history of colon cancer    Family history of melanoma    Hearing loss    wears hearing aids   History of anemia as a child    History of hiatal hernia    Hypercholesteremia    diet controlled   Hyperlipidemia 02/16/2016   Hypothyroid    Hypothyroidism 02/16/2016    Past Gynecological History:  C/s x 1, menopause age 14. No LMP  recorded (lmp unknown). Patient is postmenopausal.  Family Hx:  Family History  Problem Relation Age of Onset   Hypertension Mother    Heart attack Mother    Heart attack Father    Lung cancer Father    Skin cancer Father    Diabetes Sister    Cervical cancer Sister    Skin cancer Sister    Diabetes Brother    CAD Brother    Lymphoma Brother    Skin cancer Brother    Diabetes Brother    Melanoma Brother    Melanoma Brother    Prostate cancer Brother    Diverticulitis Brother    Skin cancer Brother    Skin cancer Sister    Breast cancer Maternal Aunt 35   Colon cancer Paternal Grandmother    Lung cancer Maternal Aunt    Leukemia Cousin    Lung cancer Cousin     Review of Systems:  Constitutional  Feels well,    ENT Normal appearing ears and nares bilaterally Skin/Breast  No rash, sores, jaundice, itching, dryness Cardiovascular  No chest pain, shortness of breath, or edema  Pulmonary  No cough or wheeze.  Gastro Intestinal  No nausea, vomitting, or diarrhoea. No bright red blood per rectum, no abdominal pain, change in bowel movement, or constipation.  Genito Urinary  No frequency, urgency, dysuria, no bleeding Musculo Skeletal  No myalgia, arthralgia, joint swelling or pain  Neurologic  No weakness, numbness, change in gait,  Psychology  No depression, anxiety, insomnia.   Vitals:  Blood pressure (!) 155/89, pulse 82, temperature 98.7 F (37.1 C), temperature source Oral, resp. rate 16, height 5' 2.5" (1.588 m), weight 197 lb (89.4 kg), SpO2 97 %.  Physical Exam: WD in NAD Neck  Supple NROM, without any enlargements.  Lymph Node Survey No cervical supraclavicular or inguinal adenopathy Cardiovascular  Pulse normal rate, regularity and rhythm. S1 and S2 normal.  Lungs  Clear to auscultation bilateraly, without wheezes/crackles/rhonchi. Good air movement.  Skin  No rash/lesions/breakdown  Psychiatry  Alert and oriented to person, place, and time   Abdomen  Normoactive bowel sounds, abdomen soft, non-tender and obese without evidence of hernia. Soft  abdominal incisions.  Back No CVA tenderness Genito Urinary  Vaginal cuff smooth. No palpable masses. No visible lesions. Tenderness on  deep palpation at the vaginal cuff.  Rectal  deferred Extremities  No bilateral cyanosis, clubbing or edema.  Thereasa Solo, MD  08/27/2020, 1:24 PM

## 2020-10-05 ENCOUNTER — Ambulatory Visit
Admission: RE | Admit: 2020-10-05 | Discharge: 2020-10-05 | Disposition: A | Discharge status: Home / Self Care | Payer: Medicare PPO | Source: Ambulatory Visit | Attending: Adult Health | Admitting: Adult Health

## 2020-10-05 ENCOUNTER — Other Ambulatory Visit: Payer: Self-pay

## 2020-10-05 DIAGNOSIS — Z1231 Encounter for screening mammogram for malignant neoplasm of breast: Secondary | ICD-10-CM | POA: Diagnosis not present

## 2020-10-05 DIAGNOSIS — C50412 Malignant neoplasm of upper-outer quadrant of left female breast: Secondary | ICD-10-CM

## 2020-10-25 ENCOUNTER — Other Ambulatory Visit: Payer: Self-pay | Admitting: *Deleted

## 2020-10-25 ENCOUNTER — Other Ambulatory Visit: Payer: Self-pay

## 2020-10-25 ENCOUNTER — Ambulatory Visit: Payer: Medicare PPO | Attending: Surgery

## 2020-10-25 VITALS — Wt 199.4 lb

## 2020-10-25 DIAGNOSIS — Z483 Aftercare following surgery for neoplasm: Secondary | ICD-10-CM | POA: Insufficient documentation

## 2020-10-25 DIAGNOSIS — C50412 Malignant neoplasm of upper-outer quadrant of left female breast: Secondary | ICD-10-CM

## 2020-10-25 DIAGNOSIS — Z170 Estrogen receptor positive status [ER+]: Secondary | ICD-10-CM

## 2020-10-25 NOTE — Progress Notes (Addendum)
Elizabeth [facility]  [phone] [phone]     ID: Elizabeth Maldonado DOB: 1945-11-07  MR#: [id number]  EGB#:[id number]  Patient Care Team: Elizabeth Ada, Elizabeth as PCP - General (Family Medicine) Elizabeth Latch, Elizabeth as PCP - Cardiology (Cardiology) Elizabeth Maldonado, Elizabeth Dad, Elizabeth as Consulting Physician (Oncology) Elizabeth Keens, Elizabeth as Consulting Physician (General Surgery) Elizabeth Rudd, Elizabeth as Consulting Physician (Radiation Oncology) Elizabeth Corner, Elizabeth as Consulting Physician (Gastroenterology) Elizabeth Kaufmann, RN as Oncology Nurse Navigator Elizabeth Germany, RN as Oncology Nurse Navigator Elizabeth Maldonado, Elizabeth Maldonado:  CHIEF COMPLAINT: Estrogen receptor positive breast cancer (s/p left mastectomy)  CURRENT TREATMENT: Exemestane   INTERVAL HISTORY: Elizabeth Maldonado returns today for follow up of her estrogen receptor positive breast cancer.   She was started on anastrozole at her last visit on 05/26/2020.  She did well with that initially, aside from some fairly violent hot flashes.  More recently though she has started to develop significant arthralgias/myalgias involving her hands particularly as well as other parts.  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  Since her last visit, she underwent right screening mammography with tomography at [facility] on 10/05/2020 showing: breast density category B; no evidence of malignancy in either breast.  She is scheduled for bone density screening on 12/06/2020.    REVIEW OF SYSTEMS: Elizabeth Maldonado walks her dog every day.  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  She travels with her husband.  She is a Engineer, manufacturing systems.  She is quite busy with her life and she goes to mass every morning.  A detailed review of systems today was otherwise stable   COVID 19 VACCINATION STATUS: [place] [place] x2; infection 10/24/2019, no booster as of March 2022; she did have COVID in 2020   HISTORY OF CURRENT ILLNESS: From Elizabeth  original intake note:  Elizabeth Maldonado had routine screening mammography on 01/06/2020 showing a possible abnormality in Elizabeth left breast. She underwent left diagnostic mammography with tomography and left breast ultrasonography at [facility] on 01/13/2020 showing: breast density category B; two adjacent masses in left breast at 3 o'clock-- one measuring 1 cm and Elizabeth other 0.8 cm; small asymmetry 3 cm posterior and medial to mass on mammography, no sonographic correlate; 1.6 cm group of indeterminate calcifications in retroareolar medial left breast; normal-appearing lymph nodes.  Accordingly on 02/12/2020 she proceeded to biopsy of Elizabeth left breast area in question. Elizabeth pathology from this procedure ([id number]) showed:  1. Left Breast, Retroareolar  - intraductal papilloma with atypia and associated dystrophic calcifications 2. Left Breast, 3 o'clock  - invasive ductal carcinoma, grade 2  - ductal carcinoma in situ, intermediate grade   - Prognostic indicators significant for: estrogen receptor, >95% positive and progesterone receptor, >95% positive, both with strong staining intensity. Proliferation marker Ki67 at 5%. HER2 negative by immunohistochemistry (0).  Cancer Staging Malignant neoplasm of upper-outer quadrant of left breast in female, estrogen receptor positive ([facility]) Staging form: Breast, AJCC 8th Edition - Clinical stage from 03/09/2020: Stage IA (cT1c, cN0, cM0, G2, ER+, PR+, HER2-) - Signed by Elizabeth Cruel, Elizabeth on 03/17/2020 Stage prefix: Initial diagnosis Method of lymph node assessment: Clinical Histologic grading system: 3 grade system  Elizabeth patient's subsequent history is as detailed below.   PAST Elizabeth HISTORY: Past Elizabeth History:  Diagnosis Date   Breast cancer ([place]) 02/12/2020   Diverticulosis    Elevated blood pressure reading    165/71 a  pre-op appt, reports she has seen her primary care in Elizabeth last few months and intheir office it was in Elizabeth 161W systolic, was not  started on meds at that time, says her bp has "been creeping up since all this stuff started" , denies acute cardiac symptoms    Family history of breast cancer    Family history of colon cancer    Family history of melanoma    Hearing loss    wears hearing aids   History of anemia as a child    History of hiatal hernia    Hypercholesteremia    diet controlled   Hyperlipidemia 02/16/2016   Hypothyroid    Hypothyroidism 02/16/2016    PAST SURGICAL HISTORY: Past Surgical History:  Procedure Laterality Date   ABDOMINAL HYSTERECTOMY     APPENDECTOMY     BUNIONECTOMY     CATARACT EXTRACTION     CATARACT EXTRACTION, BILATERAL     CESAREAN SECTION     CHOLECYSTECTOMY     DILATION AND CURETTAGE OF UTERUS     EYE SURGERY     FOOT SURGERY Right 2011   bone graft    HYSTEROSCOPY WITH D & C N/A 06/11/2018   Procedure: DILATATION AND CURETTAGE /HYSTEROSCOPY WITH MYOSURE POLYPECTOMY;  Surgeon: Lavonia Drafts, Elizabeth;  Location: [facility];  Service: Gynecology;  Laterality: N/A;   MASTECTOMY W/ SENTINEL NODE BIOPSY Left 04/19/2020   Procedure: LEFT MASTECTOMY WITH SENTINEL LYMPH NODE BIOPSY;  Surgeon: Elizabeth Keens, Elizabeth;  Location: [facility];  Service: General;  Laterality: Left;   NASAL SEPTUM SURGERY     at age 20   ROBOTIC [place] Bilateral 07/17/2018   Procedure: XI ROBOTIC ASSISTED TOTAL HYSTERECTOMY WITH BILATERAL SALPINGO OOPHORECTOMY AND LEFT PELVIC LYMPADECTOMY;  Surgeon: Everitt Amber, Elizabeth;  Location: [facility];  Service: Gynecology;  Laterality: Bilateral;   SENTINEL NODE BIOPSY N/A 07/17/2018   Procedure: SENTINEL NODE BIOPSY;  Surgeon: Everitt Amber, Elizabeth;  Location: [facility];  Service: Gynecology;  Laterality: N/A;   TONSILLECTOMY     torn meniscus repair Right     FAMILY HISTORY: Family History  Problem Relation Age of Onset   Hypertension Mother    Heart attack Mother    Heart attack Father    Lung cancer Father    Skin cancer  Father    Diabetes Sister    Cervical cancer Sister    Skin cancer Sister    Diabetes Brother    CAD Brother    Lymphoma Brother    Skin cancer Brother    Diabetes Brother    Melanoma Brother    Melanoma Brother    Prostate cancer Brother    Diverticulitis Brother    Skin cancer Brother    Skin cancer Sister    Breast cancer Maternal Aunt 35   Colon cancer Paternal Grandmother    Lung cancer Maternal Aunt    Leukemia Cousin    Lung cancer Cousin   Elizabeth patient's father died at Elizabeth age of 31 from lung cancer in Elizabeth setting of tobacco abuse.  His mother had colon cancer and his father may have had some unknown type of cancer.  Elizabeth patient's father had 1 brother and 1 sister with no cancer and also no children.  Elizabeth patient's mother died at Elizabeth age of 94 from renal failure.  Her parents did not have cancer.  She had 1 sister with breast cancer at Elizabeth age of 64, 1 sister  with lung cancer and a second sister with         .  Also on Elizabeth maternal side there were 2 cousins with lung cancer and one with leukemia.   GYNECOLOGIC HISTORY:  No LMP recorded (lmp unknown). Patient is postmenopausal. Menarche: 75 years old Age at first live birth: 75 years old [place] P 1 LMP age 62 Contraceptive HRT at least 5 years  Hysterectomy? Yes, 07/2018, for grade 1 endometrial cancer, final pathology showed no residual carcinoma BSO? 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  She lives with her husband Elizabeth Maldonado, currently 52, who is a retired Financial risk analyst working for [employer].  Their daughter Elizabeth Maldonado, 70, lives in [place] and is a clinical psychiatrist working at [facility].  Elizabeth patient has no grandchildren.  She attends [employer] and also a [employer] which is her husband's denomination.  They both go to both churches every Sunday and participated in both communities.   ADVANCED DIRECTIVES: In  Elizabeth absence of any documentation to Elizabeth contrary, Elizabeth patient's spouse is their HCPOA.    HEALTH MAINTENANCE: Social History   Tobacco Use   Smoking status: Never   Smokeless tobacco: Never  Vaping Use   Vaping Use: Never used  Substance Use Topics   Alcohol use: Not Currently    Comment: one to two beers a year   Drug use: No     Colonoscopy: April 2014/ [facility]  PAP: 01/2016, negative  Bone density:    Allergies  Allergen Reactions   Crestor [Rosuvastatin] Other (See Comments)    myalgia   Lipitor [Atorvastatin] Other (See Comments)    myalgia   Colestipol Hcl Other (See Comments)   Acetaminophen Itching and Other (See Comments)    "hyperactivity"   Vytorin [Ezetimibe-Simvastatin] Other (See Comments)    myalgia    Current Outpatient Medications  Medication Sig Dispense Refill   anastrozole (ARIMIDEX) 1 MG tablet Take 1 tablet (1 mg total) by mouth daily. 90 tablet 4   b complex vitamins tablet Take 1 tablet by mouth daily with supper.     Calcium Carb-Cholecalciferol 500-400 MG-UNIT TABS Take 1 tablet by mouth daily with supper.     Calcium Citrate-Vitamin D (CALCIUM + D PO) Take 1 tablet by mouth daily.     Cholecalciferol (VITAMIN D) 125 MCG (5000 UT) CAPS Take 5,000 Units by mouth daily with supper.     Coenzyme Q10 (COQ10) 200 MG CAPS Take 200 mg by mouth daily with supper.     gabapentin (NEURONTIN) 300 MG capsule Take 1 capsule (300 mg total) by mouth at bedtime. (Patient not taking: Reported on 08/27/2020) 90 capsule 4   levothyroxine (SYNTHROID, LEVOTHROID) 75 MCG tablet Take 75 mcg by mouth daily before breakfast.     Multiple Vitamin (MULTIVITAMIN WITH MINERALS) TABS tablet Take 1 tablet by mouth daily with supper.     oxyCODONE (OXY IR/ROXICODONE) 5 MG immediate release tablet Take 1 tablet (5 mg total) by mouth every 6 (six) hours as needed for moderate pain or severe pain. (Patient not taking: Reported on 08/27/2020) 25 tablet 0   REPATHA SURECLICK 793  MG/ML SOAJ Inject 140 mg into Elizabeth skin every 14 (fourteen) days.     Tetrahydrozoline HCl (VISINE OP) Place 1 drop into both eyes daily as needed (irritation/itchy eyes). (Patient not taking: Reported on 08/27/2020)     vitamin C (ASCORBIC ACID)  500 MG tablet Take 500 mg by mouth daily with supper. (Patient not taking: Reported on 08/27/2020)     No current facility-administered medications for this visit.    OBJECTIVE: White woman who appears stated age  52:   10/26/20 1428  BP: (!) 143/58  Pulse: 77  Resp: 18  Temp: 97.6 F (36.4 C)  SpO2: 97%      Body mass index is 36.98 kg/m.   Wt Readings from Last 3 Encounters:  10/26/20 202 lb 3.2 oz (91.7 kg)  10/25/20 199 lb 6 oz (90.4 kg)  08/27/20 197 lb (89.4 kg)     ECOG FS:1 - Symptomatic but completely ambulatory  Sclerae unicteric, EOMs intact Wearing a mask No cervical or supraclavicular adenopathy Lungs no rales or rhonchi Heart regular rate and rhythm Abd soft, obese, nontender, positive bowel sounds MSK no focal spinal tenderness, no upper extremity lymphedema Neuro: nonfocal, well oriented, appropriate affect Breasts: Elizabeth right breast is benign.  Elizabeth left breast is status postmastectomy.  Elizabeth incision is somewhat bulky and it gets in Elizabeth way of her prosthesis.  Otherwise it is unremarkable.  Both axillae are benign.   LAB RESULTS:  CMP     Component Value Date/Time   NA 137 04/20/2020 0308   K 4.3 04/20/2020 0308   CL 108 04/20/2020 0308   CO2 23 04/20/2020 0308   GLUCOSE 128 (H) 04/20/2020 0308   BUN 14 04/20/2020 0308   CREATININE 0.84 04/20/2020 0308   CREATININE 0.99 08/30/2018 1214   CALCIUM 8.3 (L) 04/20/2020 0308   PROT 7.6 10/24/2019 1052   ALBUMIN 4.0 10/24/2019 1052   AST 21 10/24/2019 1052   ALT 17 10/24/2019 1052   ALKPHOS 69 10/24/2019 1052   BILITOT 1.1 10/24/2019 1052   GFRNONAA >60 04/20/2020 0308   GFRNONAA 57 (L) 08/30/2018 1214   GFRAA >60 08/30/2018 1214    No results found  for: TOTALPROTELP, ALBUMINELP, A1GS, A2GS, BETS, BETA2SER, GAMS, MSPIKE, SPEI  Lab Results  Component Value Date   WBC 4.7 10/26/2020   NEUTROABS 2.5 10/26/2020   HGB 12.1 10/26/2020   HCT 36.8 10/26/2020   MCV 92.2 10/26/2020   PLT 213 10/26/2020    No results found for: LABCA2  No components found for: [id number]  No results for input(s): INR in Elizabeth last 168 hours.  No results found for: LABCA2  No results found for: FXO329  No results found for: VBT660  No results found for: AYO459  No results found for: CA2729  No components found for: HGQUANT  No results found for: CEA1 / No results found for: CEA1   No results found for: AFPTUMOR  No results found for: CHROMOGRNA  No results found for: KPAFRELGTCHN, LAMBDASER, KAPLAMBRATIO (kappa/lambda light chains)  No results found for: HGBA, HGBA2QUANT, HGBFQUANT, HGBSQUAN (Hemoglobinopathy evaluation)   No results found for: LDH  No results found for: IRON, TIBC, IRONPCTSAT (Iron and TIBC)  No results found for: FERRITIN  Urinalysis    Component Value Date/Time   COLORURINE STRAW (A) 08/29/2018 1148   APPEARANCEUR CLEAR 08/29/2018 1148   LABSPEC 1.005 08/29/2018 1148   PHURINE 6.0 08/29/2018 1148   GLUCOSEU NEGATIVE 08/29/2018 [place] 08/29/2018 [place] 08/29/2018 [place] 08/29/2018 [place] 08/29/2018 1148   UROBILINOGEN 0.2 11/19/2011 1851   NITRITE NEGATIVE 08/29/2018 1148   LEUKOCYTESUR NEGATIVE 08/29/2018 1148    STUDIES: MM 3D SCREEN BREAST UNI RIGHT  Result Date: 10/09/2020 CLINICAL DATA:  Screening. EXAM: DIGITAL SCREENING UNILATERAL RIGHT MAMMOGRAM WITH CAD AND TOMOSYNTHESIS TECHNIQUE: Right screening digital craniocaudal and mediolateral oblique mammograms were obtained. Right screening digital breast tomosynthesis was performed. Elizabeth images were evaluated with computer-aided detection. COMPARISON:  Previous exam(s). ACR Breast  Density Category b: There are scattered areas of fibroglandular density. FINDINGS: Elizabeth patient has had a left mastectomy. There are no findings suspicious for malignancy. IMPRESSION: No mammographic evidence of malignancy. A result letter of this screening mammogram will be mailed directly to Elizabeth patient. RECOMMENDATION: Screening mammogram in one year.  (Code:[id number]) BI-RADS CATEGORY  1: Negative. Electronically Signed   By: Ammie Ferrier M.D.   On: 10/09/2020 07:43     ELIGIBLE FOR AVAILABLE RESEARCH PROTOCOL: no  ASSESSMENT: 75 y.o. [place] woman status post left breast biopsy 02/12/2020 for a clinical T1b N0, stage IA invasive ductal carcinoma, grade 2, estrogen and progesterone receptor positive, HER-2 nonamplified, with an MIB-1 of 5%.  (1) history of stage IA grade 1 endometrioid endometrial cancer, status post robotic assisted total hysterectomy with bilateral salpingo-oophorectomy, sentinel lymph node biopsy and left pelvic lymphadenectomy 07/17/2018  (a) given good prognosis no adjuvant therapy required for Elizabeth endometrial cancer  (2) genetics testing 03/26/2020 through [facility] CancerNext-Expanded gene panel offered by [facility] found no deleterious mutations in  AIP, ALK, APC*, ATM*, AXIN2, BAP1, BARD1, BLM, BMPR1A, BRCA1*, BRCA2*, BRIP1*, CDC73, CDH1*, CDK4, CDKN1B, CDKN2A, CHEK2*, CTNNA1, DICER1, FANCC, FH, FLCN, GALNT12, KIF1B, LZTR1, MAX, MEN1, MET, MLH1*, MSH2*, MSH3, MSH6*, MUTYH*, NBN, NF1*, NF2, NTHL1, PALB2*, PHOX2B, PMS2*, POT1, PRKAR1A, PTCH1, PTEN*, RAD51C*, RAD51D*, RB1, RECQL, RET, SDHA, SDHAF2, SDHB, SDHC, SDHD, SMAD4, SMARCA4, SMARCB1, SMARCE1, STK11, SUFU, TMEM127, TP53*, TSC1, TSC2, VHL and XRCC2 (sequencing and deletion/duplication); EGFR, EGLN1, HOXB13, KIT, MITF, PDGFRA, POLD1, and POLE (sequencing only); EPCAM and GREM1 (deletion/duplication only). DNA and RNA analyses performed for * genes. Elizabeth report date is April 01, 2020. Negative genetic testing on Elizabeth  BRCAplus panel test.  Elizabeth BRCAPlus gene panel offered by [facility] and includes sequencing and rearrangement analysis for Elizabeth following 6 genes: BRCA1, BRCA2, CDH1, PALB2, PTEN, and TP53.     (a) PMS2 VUS identified.   (3) status post left mastectomy and sentinel lymph node sampling 04/19/2020 for an mpT1c N0, stage IA invasive ductal carcinoma, grade 1-2, with negative margin  (a) a total of 3 left axillary lymph nodes removed  (4) anastrozole started 05/26/2020, discontinued October 2022 with arthralgias and myalgias  (5) exemestane started November 2022   PLAN: Jennine was very motivated to do well with anastrozole but she has developed significant arthralgias and we are stopping that medication.  We discussed going to tamoxifen versus going to exemestane.  We are going to give exemestane a try and I have placed Elizabeth order in for her.  She will started 11/02/2020.  I am going to check with her in December to make sure she is tolerating it well.  If not or if it proved too expensive then we will go with tamoxifen.  She is interested in a plastic surgery referral to see if they can "flatten" her left chest.  That has been placed.  Incidentally she tells me her sister is an award Ship broker.  She knows to call for any other issue that may develop before Elizabeth next visit  Total encounter time 25 minutes.Sarajane Jews C. Deland Slocumb, Elizabeth 10/26/2020 2:43 PM [facility] [address]  [place], [place] [address] Tel. [phone]    Fax. [phone]   This document serves as a record of services personally performed by Lurline Del, Elizabeth. It was created on his behalf by Wilburn Mylar, a trained Elizabeth scribe. Elizabeth creation of this record is based on Elizabeth scribe's personal observations and Elizabeth provider's statements to them.   I, Lurline Del Elizabeth, have reviewed Elizabeth above documentation for accuracy and completeness, and I agree with Elizabeth  above.   *Total Encounter Time as defined by [facility] for Medicare and Medicaid Services includes, in addition to Elizabeth face-to-face time of a patient visit (documented in Elizabeth note above) non-face-to-face time: obtaining and reviewing outside history, ordering and reviewing medications, tests or procedures, care coordination (communications with other health care professionals or caregivers) and documentation in Elizabeth Elizabeth record.

## 2020-10-25 NOTE — Therapy (Signed)
[facility] @ [place], [place], [address] Phone: [phone]   Fax:  [phone]  Physical Therapy Treatment  Patient Details  Name: Elizabeth Maldonado MRN: [id number] Date of Birth: 11/10/1945 Referring Provider (PT): Ninfa Linden   Encounter Date: 10/25/2020   PT End of Session - 10/25/20 1520     Visit Number 7   # unchanged due to screen only   PT Start Time 1518    PT Stop Time 1524    PT Time Calculation (min) 6 min    Activity Tolerance Patient tolerated treatment well    Behavior During Therapy [facility] for tasks assessed/performed             Past Medical History:  Diagnosis Date   Breast cancer ([place]) 02/12/2020   Diverticulosis    Elevated blood pressure reading    165/71 a pre-op appt, reports she has seen her primary care in the last few months and intheir office it was in the 694W systolic, was not started on meds at that time, says her bp has "been creeping up since all this stuff started" , denies acute cardiac symptoms    Family history of breast cancer    Family history of colon cancer    Family history of melanoma    Hearing loss    wears hearing aids   History of anemia as a child    History of hiatal hernia    Hypercholesteremia    diet controlled   Hyperlipidemia 02/16/2016   Hypothyroid    Hypothyroidism 02/16/2016    Past Surgical History:  Procedure Laterality Date   ABDOMINAL HYSTERECTOMY     APPENDECTOMY     BUNIONECTOMY     CATARACT EXTRACTION     CATARACT EXTRACTION, BILATERAL     CESAREAN SECTION     CHOLECYSTECTOMY     DILATION AND CURETTAGE OF UTERUS     EYE SURGERY     FOOT SURGERY Right 2011   bone graft    HYSTEROSCOPY WITH D & C N/A 06/11/2018   Procedure: DILATATION AND CURETTAGE /HYSTEROSCOPY WITH MYOSURE POLYPECTOMY;  Surgeon: Lavonia Drafts, MD;  Location: [place];  Service: Gynecology;  Laterality: N/A;   MASTECTOMY W/ SENTINEL NODE BIOPSY Left  04/19/2020   Procedure: LEFT MASTECTOMY WITH SENTINEL LYMPH NODE BIOPSY;  Surgeon: Coralie Keens, MD;  Location: [place];  Service: General;  Laterality: Left;   NASAL SEPTUM SURGERY     at age 32   ROBOTIC [place] Bilateral 07/17/2018   Procedure: XI ROBOTIC ASSISTED TOTAL HYSTERECTOMY WITH BILATERAL SALPINGO OOPHORECTOMY AND LEFT PELVIC LYMPADECTOMY;  Surgeon: Everitt Amber, MD;  Location: [facility];  Service: Gynecology;  Laterality: Bilateral;   SENTINEL NODE BIOPSY N/A 07/17/2018   Procedure: SENTINEL NODE BIOPSY;  Surgeon: Everitt Amber, MD;  Location: [facility];  Service: Gynecology;  Laterality: N/A;   TONSILLECTOMY     torn meniscus repair Right     Vitals:   10/25/20 1519  Weight: 199 lb 6 oz (90.4 kg)     Subjective Assessment - 10/25/20 1519     Subjective Pt returns for her 3 month L-Dex screen.    Pertinent History L breast cancer, ER+, plan is to undergo a L mastectomy and SLNB.  04/20/2020" left mastectomy with deep axillay sentinel lymph node dissection 0/4 positive.  L-DEX FLOWSHEETS - 10/25/20 1500       L-DEX LYMPHEDEMA SCREENING   Measurement Type Unilateral    L-DEX MEASUREMENT EXTREMITY Upper Extremity    POSITION  Standing    DOMINANT SIDE Left    At Risk Side Left    BASELINE SCORE (UNILATERAL) -3.9    L-DEX SCORE (UNILATERAL) -3.6    VALUE CHANGE (UNILAT) 0.3                                  PT Short Term Goals - 07/27/20 1723       PT SHORT TERM GOAL #1   Title pt will be independent in a basic exericse program for shoulder ROM and strength    Status Achieved      PT SHORT TERM GOAL #2   Title Pt will report the pain in her left upper quadrant is decreased to 2/10    Status Achieved               PT Long Term Goals - 07/27/20 1723       PT LONG TERM GOAL #1   Title Pt will return to baseline shoulder ROM and not demonstrate any signs  or symptoms of lymphedema.    Baseline 06/15/2020 Pt with lymphedema in her anterior and lateral chest, 07/27/2020 hematoma continues to heal, but has some congestion    Status Partially Met      PT LONG TERM GOAL #2   Title Pt will increase the number of sit to stand in 30 sec by 3 from initial measurment.    Baseline pt reports she is doing well    Status Deferred      PT LONG TERM GOAL #3   Title Pt will decrease TUG score by 2 seconds from initial measurement    Baseline pt states she is doing well    Status Deferred                   Plan - 10/25/20 1526     Clinical Impression Statement Pt returns for her 3 month L-Dex screen. Her change from baseline of 0.3 is WNLs so no further treatment is required at this time except to cont every 3 month L-Dex screens which pt is agreeable to for now. She does want to try every 6 months after the holidays. She was instructed that research shows that we can be most consistent in catching subclinical lymphedema with every 3 month screens. Pt verbalized understanding.    PT Next Visit Plan Cont every 3 month L-Dex screens from her SLNB (~04/21/22)    Consulted and Agree with Plan of Care Patient             Patient will benefit from skilled therapeutic intervention in order to improve the following deficits and impairments:     Visit Diagnosis: Aftercare following surgery for neoplasm     Problem List Patient Active Problem List   Diagnosis Date Noted   History of left breast cancer 04/19/2020   Genetic testing 03/29/2020   Family history of breast cancer    Family history of melanoma    Family history of colon cancer    Malignant neoplasm of upper-outer quadrant of left breast in female, estrogen receptor positive ([place]) 03/09/2020   Family history of coronary artery disease 03/06/2019   Endometrial cancer ([place]) 07/11/2018   Obesity (BMI 35.0-39.9 without comorbidity) 07/11/2018  Endometrial polyp 06/11/2018    Post-menopausal bleeding 06/06/2018   Hyperlipidemia 02/16/2016   Hypothyroidism 02/16/2016   Plantar fasciitis of right foot 12/16/2015   Metatarsal deformity, right 12/16/2015   Pronation deformity of ankle, acquired 12/16/2015    Otelia Limes, PTA 10/25/2020, 3:29 PM  [facility] @ [place], [place], [address] Phone: [phone]   Fax:  [phone]  Name: ANAHLIA ISEMINGER MRN: [id number] Date of Birth: [birth year]

## 2020-10-26 ENCOUNTER — Inpatient Hospital Stay: Payer: Medicare PPO | Attending: Gynecologic Oncology

## 2020-10-26 ENCOUNTER — Inpatient Hospital Stay: Payer: Medicare PPO | Admitting: Oncology

## 2020-10-26 VITALS — BP 143/58 | HR 77 | Temp 97.6°F | Resp 18 | Ht 62.0 in | Wt 202.2 lb

## 2020-10-26 DIAGNOSIS — Z807 Family history of other malignant neoplasms of lymphoid, hematopoietic and related tissues: Secondary | ICD-10-CM | POA: Diagnosis not present

## 2020-10-26 DIAGNOSIS — Z9079 Acquired absence of other genital organ(s): Secondary | ICD-10-CM | POA: Insufficient documentation

## 2020-10-26 DIAGNOSIS — Z801 Family history of malignant neoplasm of trachea, bronchus and lung: Secondary | ICD-10-CM | POA: Diagnosis not present

## 2020-10-26 DIAGNOSIS — Z90722 Acquired absence of ovaries, bilateral: Secondary | ICD-10-CM | POA: Diagnosis not present

## 2020-10-26 DIAGNOSIS — Z803 Family history of malignant neoplasm of breast: Secondary | ICD-10-CM | POA: Diagnosis not present

## 2020-10-26 DIAGNOSIS — Z8542 Personal history of malignant neoplasm of other parts of uterus: Secondary | ICD-10-CM | POA: Insufficient documentation

## 2020-10-26 DIAGNOSIS — Z8049 Family history of malignant neoplasm of other genital organs: Secondary | ICD-10-CM | POA: Diagnosis not present

## 2020-10-26 DIAGNOSIS — E039 Hypothyroidism, unspecified: Secondary | ICD-10-CM | POA: Insufficient documentation

## 2020-10-26 DIAGNOSIS — Z79899 Other long term (current) drug therapy: Secondary | ICD-10-CM | POA: Diagnosis not present

## 2020-10-26 DIAGNOSIS — Z9012 Acquired absence of left breast and nipple: Secondary | ICD-10-CM | POA: Insufficient documentation

## 2020-10-26 DIAGNOSIS — Z800 Family history of malignant neoplasm of digestive organs: Secondary | ICD-10-CM | POA: Insufficient documentation

## 2020-10-26 DIAGNOSIS — C541 Malignant neoplasm of endometrium: Secondary | ICD-10-CM

## 2020-10-26 DIAGNOSIS — C50412 Malignant neoplasm of upper-outer quadrant of left female breast: Secondary | ICD-10-CM

## 2020-10-26 DIAGNOSIS — Z79811 Long term (current) use of aromatase inhibitors: Secondary | ICD-10-CM | POA: Diagnosis not present

## 2020-10-26 DIAGNOSIS — Z170 Estrogen receptor positive status [ER+]: Secondary | ICD-10-CM | POA: Diagnosis not present

## 2020-10-26 DIAGNOSIS — Z806 Family history of leukemia: Secondary | ICD-10-CM | POA: Diagnosis not present

## 2020-10-26 DIAGNOSIS — Z90710 Acquired absence of both cervix and uterus: Secondary | ICD-10-CM | POA: Insufficient documentation

## 2020-10-26 DIAGNOSIS — Z808 Family history of malignant neoplasm of other organs or systems: Secondary | ICD-10-CM | POA: Insufficient documentation

## 2020-10-26 LAB — CMP (CANCER CENTER ONLY)
ALT: 13 U/L (ref 0–44)
AST: 15 U/L (ref 15–41)
Albumin: 3.8 g/dL (ref 3.5–5.0)
Alkaline Phosphatase: 70 U/L (ref 38–126)
Anion gap: 7 (ref 5–15)
BUN: 21 mg/dL (ref 8–23)
CO2: 28 mmol/L (ref 22–32)
Calcium: 9.6 mg/dL (ref 8.9–10.3)
Chloride: 105 mmol/L (ref 98–111)
Creatinine: 1.04 mg/dL — ABNORMAL HIGH (ref 0.44–1.00)
GFR, Estimated: 57 mL/min — ABNORMAL LOW (ref >60)
Glucose, Bld: 102 mg/dL — ABNORMAL HIGH (ref 70–99)
Potassium: 4.5 mmol/L (ref 3.5–5.1)
Sodium: 140 mmol/L (ref 135–145)
Total Bilirubin: 1 mg/dL (ref 0.3–1.2)
Total Protein: 7.2 g/dL (ref 6.5–8.1)

## 2020-10-26 LAB — CBC WITH DIFFERENTIAL (CANCER CENTER ONLY)
Abs Immature Granulocytes: 0.01 10*3/uL (ref 0.00–0.07)
Basophils Absolute: 0 10*3/uL (ref 0.0–0.1)
Basophils Relative: 1 %
Eosinophils Absolute: 0.1 10*3/uL (ref 0.0–0.5)
Eosinophils Relative: 1 %
HCT: 36.8 % (ref 36.0–46.0)
Hemoglobin: 12.1 g/dL (ref 12.0–15.0)
Immature Granulocytes: 0 %
Lymphocytes Relative: 39 %
Lymphs Abs: 1.8 10*3/uL (ref 0.7–4.0)
MCH: 30.3 pg (ref 26.0–34.0)
MCHC: 32.9 g/dL (ref 30.0–36.0)
MCV: 92.2 fL (ref 80.0–100.0)
Monocytes Absolute: 0.3 10*3/uL (ref 0.1–1.0)
Monocytes Relative: 6 %
Neutro Abs: 2.5 10*3/uL (ref 1.7–7.7)
Neutrophils Relative %: 53 %
Platelet Count: 213 10*3/uL (ref 150–400)
RBC: 3.99 MIL/uL (ref 3.87–5.11)
RDW: 13.8 % (ref 11.5–15.5)
WBC Count: 4.7 10*3/uL (ref 4.0–10.5)
nRBC: 0 % (ref 0.0–0.2)

## 2020-10-26 MED ORDER — EXEMESTANE 25 MG PO TABS
25.0000 mg | ORAL_TABLET | Freq: Every day | ORAL | 4 refills | Status: DC
Start: 2020-10-26 — End: 2020-12-20

## 2020-11-03 ENCOUNTER — Other Ambulatory Visit: Payer: Self-pay

## 2020-11-03 ENCOUNTER — Ambulatory Visit: Payer: Medicare PPO | Admitting: Plastic Surgery

## 2020-11-03 DIAGNOSIS — Z170 Estrogen receptor positive status [ER+]: Secondary | ICD-10-CM

## 2020-11-03 DIAGNOSIS — L987 Excessive and redundant skin and subcutaneous tissue: Secondary | ICD-10-CM

## 2020-11-03 DIAGNOSIS — C50412 Malignant neoplasm of upper-outer quadrant of left female breast: Secondary | ICD-10-CM | POA: Diagnosis not present

## 2020-11-03 NOTE — Progress Notes (Signed)
Referring Provider Carol Ada, MD [place],  [place] [address]   CC:  Chief Complaint  Patient presents with   Advice Only      Elizabeth Maldonado is an 75 y.o. female.  HPI: Patient presents to discuss revision of her left sided mastectomy.  This was done back in April of this year.  She did not pursue reconstruction at that time.  Dr. Ninfa Linden was her breast surgeon.  She is healed up well from that but is having issues with her prosthesis rubbing against her chest and feels that the excess left mastectomy skin is contributing to that.  She feels that she needs to wear her prosthetic in order for better balance with her natural right side which is much larger.  She is not interested in any surgical procedures on the right side.  Allergies  Allergen Reactions   Crestor [Rosuvastatin] Other (See Comments)    myalgia   Lipitor [Atorvastatin] Other (See Comments)    myalgia   Colestipol Hcl Other (See Comments)   Acetaminophen Itching and Other (See Comments)    "hyperactivity"   Vytorin [Ezetimibe-Simvastatin] Other (See Comments)    myalgia    Outpatient Encounter Medications as of 11/03/2020  Medication Sig   b complex vitamins tablet Take 1 tablet by mouth daily with supper.   Calcium Carb-Cholecalciferol 500-400 MG-UNIT TABS Take 1 tablet by mouth daily with supper.   Calcium Citrate-Vitamin D (CALCIUM + D PO) Take 1 tablet by mouth daily.   Cholecalciferol (VITAMIN D) 125 MCG (5000 UT) CAPS Take 5,000 Units by mouth daily with supper.   Coenzyme Q10 (COQ10) 200 MG CAPS Take 200 mg by mouth daily with supper.   exemestane (AROMASIN) 25 MG tablet Take 1 tablet (25 mg total) by mouth daily after breakfast. Start November 02, 2020   levothyroxine (SYNTHROID, LEVOTHROID) 75 MCG tablet Take 75 mcg by mouth daily before breakfast.   Multiple Vitamin (MULTIVITAMIN WITH MINERALS) TABS tablet Take 1 tablet by mouth daily with supper.   REPATHA SURECLICK 235  MG/ML SOAJ Inject 140 mg into the skin every 14 (fourteen) days.   Tetrahydrozoline HCl (VISINE OP) Place 1 drop into both eyes daily as needed (irritation/itchy eyes).   vitamin C (ASCORBIC ACID) 500 MG tablet Take 500 mg by mouth daily with supper.   No facility-administered encounter medications on file as of 11/03/2020.     Past Medical History:  Diagnosis Date   Breast cancer ([place]) 02/12/2020   Diverticulosis    Elevated blood pressure reading    165/71 a pre-op appt, reports she has seen her primary care in the last few months and intheir office it was in the 361W systolic, was not started on meds at that time, says her bp has "been creeping up since all this stuff started" , denies acute cardiac symptoms    Family history of breast cancer    Family history of colon cancer    Family history of melanoma    Hearing loss    wears hearing aids   History of anemia as a child    History of hiatal hernia    Hypercholesteremia    diet controlled   Hyperlipidemia 02/16/2016   Hypothyroid    Hypothyroidism 02/16/2016    Past Surgical History:  Procedure Laterality Date   ABDOMINAL HYSTERECTOMY     APPENDECTOMY     BUNIONECTOMY     CATARACT EXTRACTION     CATARACT EXTRACTION, BILATERAL  CESAREAN SECTION     CHOLECYSTECTOMY     DILATION AND CURETTAGE OF UTERUS     EYE SURGERY     FOOT SURGERY Right 2011   bone graft    HYSTEROSCOPY WITH D & C N/A 06/11/2018   Procedure: DILATATION AND CURETTAGE /HYSTEROSCOPY WITH MYOSURE POLYPECTOMY;  Surgeon: Lavonia Drafts, MD;  Location: [place];  Service: Gynecology;  Laterality: N/A;   MASTECTOMY W/ SENTINEL NODE BIOPSY Left 04/19/2020   Procedure: LEFT MASTECTOMY WITH SENTINEL LYMPH NODE BIOPSY;  Surgeon: Coralie Keens, MD;  Location: [place];  Service: General;  Laterality: Left;   NASAL SEPTUM SURGERY     at age 14   ROBOTIC [place] Bilateral 07/17/2018   Procedure: XI  ROBOTIC ASSISTED TOTAL HYSTERECTOMY WITH BILATERAL SALPINGO OOPHORECTOMY AND LEFT PELVIC LYMPADECTOMY;  Surgeon: Everitt Amber, MD;  Location: [facility];  Service: Gynecology;  Laterality: Bilateral;   SENTINEL NODE BIOPSY N/A 07/17/2018   Procedure: SENTINEL NODE BIOPSY;  Surgeon: Everitt Amber, MD;  Location[facility];  Service: Gynecology;  Laterality: N/A;   TONSILLECTOMY     torn meniscus repair Right     Family History  Problem Relation Age of Onset   Hypertension Mother    Heart attack Mother    Heart attack Father    Lung cancer Father    Skin cancer Father    Diabetes Sister    Cervical cancer Sister    Skin cancer Sister    Diabetes Brother    CAD Brother    Lymphoma Brother    Skin cancer Brother    Diabetes Brother    Melanoma Brother    Melanoma Brother    Prostate cancer Brother    Diverticulitis Brother    Skin cancer Brother    Skin cancer Sister    Breast cancer Maternal Aunt 35   Colon cancer Paternal Grandmother    Lung cancer Maternal Aunt    Leukemia Cousin    Lung cancer Cousin     Social History   Social History Narrative   Not on file     Review of Systems General: Denies fevers, chills, weight loss CV: Denies chest pain, shortness of breath, palpitations  Physical Exam Vitals with BMI 10/26/2020 10/25/2020 08/27/2020  Height 5\' 2"  - 5' 2.5"  Weight 202 lbs 3 oz 199 lbs 6 oz 197 lbs  BMI 50.53 - 97.67  Systolic 341 - 937  Diastolic 58 - 89  Pulse 77 - 82    General:  No acute distress,  Alert and oriented, Non-Toxic, Normal speech and affect Breast: She has grade 3 ptosis on the right.  She has a well-healed mastectomy incision on the left with redundant skin throughout.  There may be some skin irritation from folding within the bra with a prosthetic.  Assessment/Plan I long discussion with the patient about her options.  Ultimately I do think that she would be a good candidate for excision of redundant skin on the left side.  I do think this  would aid in a smoother surface for the prosthetic.  I did also bring up a reduction on the right side to help with some of the balance but she would rather not pursue that at this time.  We discussed risk that include bleeding, infection, damage to surrounding structures need for additional procedures.  I discussed the potential need for a drain.  We discussed the anticipated recovery time.  All of her questions were  answered and she is going to think about this.  Cindra Presume 11/03/2020, 4:16 PM

## 2020-12-06 ENCOUNTER — Ambulatory Visit
Admission: RE | Admit: 2020-12-06 | Discharge: 2020-12-06 | Disposition: A | Discharge status: Home / Self Care | Payer: Medicare PPO | Source: Ambulatory Visit | Attending: Oncology | Admitting: Oncology

## 2020-12-06 ENCOUNTER — Other Ambulatory Visit: Payer: Self-pay

## 2020-12-06 DIAGNOSIS — C50412 Malignant neoplasm of upper-outer quadrant of left female breast: Secondary | ICD-10-CM

## 2020-12-06 DIAGNOSIS — Z780 Asymptomatic menopausal state: Secondary | ICD-10-CM | POA: Diagnosis not present

## 2020-12-19 NOTE — Progress Notes (Signed)
Elizabeth [facility]  [phone] [phone]     ID: Elizabeth Maldonado DOB: 06-Apr-1945  MR#: [id number]  EYE#:[id number]  Patient Care Team: Carol Ada, MD as PCP - General (Family Medicine) Skeet Latch, MD as PCP - Cardiology (Cardiology) Magrinat, Virgie Dad, MD as Consulting Physician (Oncology) Coralie Keens, MD as Consulting Physician (General Surgery) Kyung Rudd, MD as Consulting Physician (Radiation Oncology) Wilford Corner, MD as Consulting Physician (Gastroenterology) Mauro Kaufmann, RN as Oncology Nurse Navigator Rockwell Germany, RN as Oncology Nurse Navigator Aurea Graff OTHER MD:  I connected with Elizabeth [facility]ado on 12/19/20 at 12:45 PM EST by video enabled telemedicine visit and verified that I am speaking with the correct person using two identifiers.   I discussed the limitations, risks, security and privacy concerns of performing an evaluation and management service by telemedicine and the availability of in-person appointments. I also discussed with the patient that there may be a patient responsible charge related to this service. The patient expressed understanding and agreed to proceed.   Other persons participating in the visit and their role in the encounter: None  Patients location: Home Providers location: [facility]   I provided 15 minutes of face-to-face video visit time during this encounter, and > 50% was spent counseling as documented under my assessment & plan.   CHIEF COMPLAINT: Estrogen receptor positive breast cancer (s/p left mastectomy)  CURRENT TREATMENT: Exemestane   INTERVAL HISTORY: Elizabeth [facility]donado was contacted today for follow up of her estrogen receptor positive breast cancer.   She was switched to exemestane on 11/02/2020.  After a few weeks she had to go off the medication because she was having constant pain in her hands and some trigger finger problems.  She was also having leg  pains.  Since stopping the medication about 2 weeks ago she says those pains have begun to improve.  Since her last visit, she underwent bone density screening on 12/06/2020 showing a T-score of -1.2, which is considered osteopenic.  She is scheduled for excision of excess mastectomy skin on 01/10/2021 under Dr. Claudia Desanctis.   REVIEW OF SYSTEMS: Elizabeth Maldonado is exercising by walking and also she is very busy she said is because her niece who used to live up [place] has moved in with them and has a local job so they have had to do a lot of furniture shifting.  A detailed review of systems today was otherwise stable   COVID 19 VACCINATION STATUS: [place] x2; infection 10/24/2019, no booster as of March 2022; she did have COVID in 2020   HISTORY OF CURRENT ILLNESS: From the original intake note:  Elizabeth Maldonado had routine screening mammography on 01/06/2020 showing a possible abnormality in the left breast. She underwent left diagnostic mammography with tomography and left breast ultrasonography at [facility] on 01/13/2020 showing: breast density category B; two adjacent masses in left breast at 3 o'clock-- one measuring 1 cm and the other 0.8 cm; small asymmetry 3 cm posterior and medial to mass on mammography, no sonographic correlate; 1.6 cm group of indeterminate calcifications in retroareolar medial left breast; normal-appearing lymph nodes.  Accordingly on 02/12/2020 she proceeded to biopsy of the left breast area in question. The pathology from this procedure ([id number]) showed:  1. Left Breast, Retroareolar  - intraductal papilloma with atypia and associated dystrophic calcifications 2. Left Breast, 3 o'clock  - invasive ductal carcinoma, grade 2  - ductal carcinoma in situ, intermediate grade   - Prognostic  indicators significant for: estrogen receptor, >95% positive and progesterone receptor, >95% positive, both with strong staining intensity. Proliferation marker Ki67 at 5%. HER2 negative by immunohistochemistry  (0).   Cancer Staging  Malignant neoplasm of upper-outer quadrant of left breast in female, estrogen receptor positive ([place]) Staging form: Breast, AJCC 8th Edition - Clinical stage from 03/09/2020: Stage IA (cT1c, cN0, cM0, G2, ER+, PR+, HER2-) - Signed by Chauncey Cruel, MD on 03/17/2020 Stage prefix: Initial diagnosis Method of lymph node assessment: Clinical Histologic grading system: 3 grade system  The patient's subsequent history is as detailed below.   PAST MEDICAL HISTORY: Past Medical History:  Diagnosis Date   Breast cancer ([place]) 02/12/2020   Diverticulosis    Elevated blood pressure reading    165/71 a pre-op appt, reports she has seen her primary care in the last few months and intheir office it was in the 952W systolic, was not started on meds at that time, says her bp has "been creeping up since all this stuff started" , denies acute cardiac symptoms    Family history of breast cancer    Family history of colon cancer    Family history of melanoma    Hearing loss    wears hearing aids   History of anemia as a child    History of hiatal hernia    Hypercholesteremia    diet controlled   Hyperlipidemia 02/16/2016   Hypothyroid    Hypothyroidism 02/16/2016    PAST SURGICAL HISTORY: Past Surgical History:  Procedure Laterality Date   ABDOMINAL HYSTERECTOMY     APPENDECTOMY     BUNIONECTOMY     CATARACT EXTRACTION     CATARACT EXTRACTION, BILATERAL     CESAREAN SECTION     CHOLECYSTECTOMY     DILATION AND CURETTAGE OF UTERUS     EYE SURGERY     FOOT SURGERY Right 2011   bone graft    HYSTEROSCOPY WITH D & C N/A 06/11/2018   Procedure: DILATATION AND CURETTAGE /HYSTEROSCOPY WITH MYOSURE POLYPECTOMY;  Surgeon: Lavonia Drafts, MD;  Location: [facility];  Service: Gynecology;  Laterality: N/A;   MASTECTOMY W/ SENTINEL NODE BIOPSY Left 04/19/2020   Procedure: LEFT MASTECTOMY WITH SENTINEL LYMPH NODE BIOPSY;  Surgeon: Coralie Keens, MD;  Location: [facility];   Service: General;  Laterality: Left;   NASAL SEPTUM SURGERY     at age 70   ROBOTIC [place] Bilateral 07/17/2018   Procedure: XI ROBOTIC ASSISTED TOTAL HYSTERECTOMY WITH BILATERAL SALPINGO OOPHORECTOMY AND LEFT PELVIC LYMPADECTOMY;  Surgeon: Everitt Amber, MD;  Location: [facility];  Service: Gynecology;  Laterality: Bilateral;   SENTINEL NODE BIOPSY N/A 07/17/2018   Procedure: SENTINEL NODE BIOPSY;  Surgeon: Everitt Amber, MD;  Location: [facility];  Service: Gynecology;  Laterality: N/A;   TONSILLECTOMY     torn meniscus repair Right     FAMILY HISTORY: Family History  Problem Relation Age of Onset   Hypertension Mother    Heart attack Mother    Heart attack Father    Lung cancer Father    Skin cancer Father    Diabetes Sister    Cervical cancer Sister    Skin cancer Sister    Diabetes Brother    CAD Brother    Lymphoma Brother    Skin cancer Brother    Diabetes Brother    Melanoma Brother    Melanoma Brother    Prostate cancer Brother    Diverticulitis Brother  Skin cancer Brother    Skin cancer Sister    Breast cancer Maternal Aunt 72   Colon cancer Paternal Grandmother    Lung cancer Maternal Aunt    Leukemia Cousin    Lung cancer Cousin   The patient's father died at the age of 73 from lung cancer in the setting of tobacco abuse.  His mother had colon cancer and his father may have had some unknown type of cancer.  The patient's father had 1 brother and 1 sister with no cancer and also no children.  The patient's mother died at the age of 3 from renal failure.  Her parents did not have cancer.  She had 1 sister with breast cancer at the age of 35, 1 sister with lung cancer and a second sister with         .  Also on the maternal side there were 2 cousins with lung cancer and one with leukemia.   GYNECOLOGIC HISTORY:  No LMP recorded (lmp unknown). Patient is postmenopausal. Menarche: 75 years old Age at first live birth:  75 years old [place] P 1 LMP age 57 Contraceptive HRT at least 5 years  Hysterectomy? Yes, 07/2018, for grade 1 endometrial cancer, final pathology showed no residual carcinoma BSO? 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  She lives with her husband Mortimer Fries, currently 61, who is a retired Financial risk analyst working for [employer].  Their daughter Berline Chough, 64, lives in [place] and is a clinical psychiatrist working at the [facility].  The patient has no grandchildren.  She attends [employer] and also a [employer] which is her husband's denomination.  They both go to both churches every Sunday and participated in both communities.   ADVANCED DIRECTIVES: In the absence of any documentation to the contrary, the patient's spouse is their HCPOA.    HEALTH MAINTENANCE: Social History   Tobacco Use   Smoking status: Never   Smokeless tobacco: Never  Vaping Use   Vaping Use: Never used  Substance Use Topics   Alcohol use: Not Currently    Comment: one to two beers a year   Drug use: No     Colonoscopy: April 2014/ [facility]  PAP: 01/2016, negative  Bone density:    Allergies  Allergen Reactions   Crestor [Rosuvastatin] Other (See Comments)    myalgia   Lipitor [Atorvastatin] Other (See Comments)    myalgia   Colestipol Hcl Other (See Comments)   Acetaminophen Itching and Other (See Comments)    "hyperactivity"   Vytorin [Ezetimibe-Simvastatin] Other (See Comments)    myalgia    Current Outpatient Medications  Medication Sig Dispense Refill   b complex vitamins tablet Take 1 tablet by mouth daily with supper.     Calcium Carb-Cholecalciferol 500-400 MG-UNIT TABS Take 1 tablet by mouth daily with supper.     Calcium Citrate-Vitamin D (CALCIUM + D PO) Take 1 tablet by mouth daily.     Cholecalciferol (VITAMIN D) 125 MCG (5000 UT) CAPS Take 5,000 Units by mouth daily with supper.      Coenzyme Q10 (COQ10) 200 MG CAPS Take 200 mg by mouth daily with supper.     exemestane (AROMASIN) 25 MG tablet Take 1 tablet (25 mg total) by mouth daily after breakfast. Start November 02, 2020 90 tablet 4   levothyroxine (SYNTHROID, LEVOTHROID) 75 MCG tablet Take 75 mcg by mouth daily before breakfast.  Multiple Vitamin (MULTIVITAMIN WITH MINERALS) TABS tablet Take 1 tablet by mouth daily with supper.     REPATHA SURECLICK 017 MG/ML SOAJ Inject 140 mg into the skin every 14 (fourteen) days.     Tetrahydrozoline HCl (VISINE OP) Place 1 drop into both eyes daily as needed (irritation/itchy eyes).     vitamin C (ASCORBIC ACID) 500 MG tablet Take 500 mg by mouth daily with supper.     No current facility-administered medications for this visit.    OBJECTIVE: [facility] woman in no acute distress  There were no vitals filed for this visit.     There is no height or weight on file to calculate BMI.   Wt Readings from Last 3 Encounters:  10/26/20 202 lb 3.2 oz (91.7 kg)  10/25/20 199 lb 6 oz (90.4 kg)  08/27/20 197 lb (89.4 kg)     ECOG FS:1 - Symptomatic but completely ambulatory  Telemedicine visit 12/20/2020     LAB RESULTS:  CMP     Component Value Date/Time   NA 140 10/26/2020 1422   K 4.5 10/26/2020 1422   CL 105 10/26/2020 1422   CO2 28 10/26/2020 1422   GLUCOSE 102 (H) 10/26/2020 1422   BUN 21 10/26/2020 1422   CREATININE 1.04 (H) 10/26/2020 1422   CALCIUM 9.6 10/26/2020 1422   PROT 7.2 10/26/2020 1422   ALBUMIN 3.8 10/26/2020 1422   AST 15 10/26/2020 1422   ALT 13 10/26/2020 1422   ALKPHOS 70 10/26/2020 1422   BILITOT 1.0 10/26/2020 1422   GFRNONAA 57 (L) 10/26/2020 1422   GFRAA >60 08/30/2018 1214    No results found for: TOTALPROTELP, ALBUMINELP, A1GS, A2GS, BETS, BETA2SER, GAMS, MSPIKE, SPEI  Lab Results  Component Value Date   WBC 4.7 10/26/2020   NEUTROABS 2.5 10/26/2020   HGB 12.1 10/26/2020   HCT 36.8 10/26/2020   MCV 92.2 10/26/2020   PLT 213  10/26/2020    No results found for: LABCA2  No components found for: CBSWHQ759  No results for input(s): INR in the last 168 hours.  No results found for: LABCA2  No results found for: FMB846  No results found for: KZL935  No results found for: TSV779  No results found for: CA2729  No components found for: HGQUANT  No results found for: CEA1 / No results found for: CEA1   No results found for: AFPTUMOR  No results found for: CHROMOGRNA  No results found for: KPAFRELGTCHN, LAMBDASER, KAPLAMBRATIO (kappa/lambda light chains)  No results found for: HGBA, HGBA2QUANT, HGBFQUANT, HGBSQUAN (Hemoglobinopathy evaluation)   No results found for: LDH  No results found for: IRON, TIBC, IRONPCTSAT (Iron and TIBC)  No results found for: FERRITIN  Urinalysis    Component Value Date/Time   COLORURINE STRAW (A) 08/29/2018 1148   APPEARANCEUR CLEAR 08/29/2018 1148   LABSPEC 1.005 08/29/2018 1148   PHURINE 6.0 08/29/2018 1148   GLUCOSEU NEGATIVE 08/29/2018 1148   HGBUR NEGATIVE 08/29/2018 [place] 08/29/2018 [place] 08/29/2018 1148   PROTEINUR NEGATIVE 08/29/2018 1148   UROBILINOGEN 0.2 11/19/2011 1851   NITRITE NEGATIVE 08/29/2018 1148   LEUKOCYTESUR NEGATIVE 08/29/2018 1148    STUDIES: DG Bone Density  Result Date: 12/06/2020 EXAM: DUAL X-RAY ABSORPTIOMETRY (DXA) FOR BONE MINERAL DENSITY IMPRESSION: Referring Physician:  Chauncey Cruel Your patient completed a bone mineral density test using GE Lunar iDXA system (analysis version: 16). Technologist: [place] PATIENT: Name: Elizabeth Maldonado, Elizabeth Maldonado Patient ID: [id number] Birth Date: 09/30/1945 Height:  62.0 in. Sex: Female Measured: 12/06/2020 Weight: 196.8 lbs. Indications: Aromasin, Bilateral Ovariectomy (65.51), Breast Cancer History, Caucasian, Estrogen Deficient, Hypothyroid, Hysterectomy, Levothyroxine, Postmenopausal, Secondary Osteoporosis Fractures: NONE Treatments: Calcium (E943.0), Vitamin D  (E933.5) ASSESSMENT: The BMD measured at Femur Neck Right is 0.867 g/cm2 with a T-score of -1.2. This patient is considered osteopenic/low bone mass according to [facility]) criteria. The quality of the exam is good. The lumbar spine was excluded due to advanced degenerative changes and scoliosis. Site Region Measured Date Measured Age YA BMD Significant CHANGE T-score DualFemur Neck Right 12/06/2020 73.6 -1.2 0.867 g/cm2 DualFemur Total Mean 12/06/2020 73.6 0.2 1.010 g/cm2 Right Forearm Radius 33% 12/06/2020 73.6 0.9 0.971 g/cm2 World Health Organization [facility]) criteria for post-menopausal, Caucasian Women: Normal       T-score at or above -1 SD Osteopenia   T-score between -1 and -2.5 SD Osteoporosis T-score at or below -2.5 SD RECOMMENDATION: 1. All patients should optimize calcium and vitamin D intake. 2. Consider FDA-approved medical therapies in postmenopausal women and men aged 95 years and older, based on the following: a. A hip or vertebral (clinical or morphometric) fracture. b. T-score = -2.5 at the femoral neck or spine after appropriate evaluation to exclude secondary causes. c. Low bone mass (T-score between -1.0 and -2.5 at the femoral neck or spine) and a 10-year probability of a hip fracture = 3% or a 10-year probability of a major osteoporosis-related fracture = 20% based on the [place]-adapted WHO algorithm. d. Clinician judgment and/or patient preferences may indicate treatment for people with 10-year fracture probabilities above or below these levels. FOLLOW-UP: Patients with diagnosis of osteoporosis or at high risk for fracture should have regular bone mineral density tests.? Patients eligible for Medicare are allowed routine testing every 2 years.? The testing frequency can be increased to one year for patients who have rapidly progressing disease, are receiving or discontinuing medical therapy to restore bone mass, or have additional risk factors. I have reviewed this study and  agree with the findings. [facility] FRAX* 10-year Probability of Fracture Based on femoral neck BMD: DualFemur (Right) Major Osteoporotic Fracture: 9.3% Hip Fracture:                1.4% Population:                  [place] (Caucasian) Risk Factors:                Secondary Osteoporosis *FRAX is a [place] of [employer] for Metabolic Bone Disease, a [place] (WHO) [facility]. ASSESSMENT: The probability of a major osteoporotic fracture is 9.3% within the next ten years. The probability of a hip fracture is 1.4% within the next ten years. Electronically Signed   By: Rolm Baptise M.D.   On: 12/06/2020 10:56     ELIGIBLE FOR AVAILABLE RESEARCH PROTOCOL: no  ASSESSMENT: 75 y.o. [place] woman status post left breast biopsy 02/12/2020 for a clinical T1b N0, stage IA invasive ductal carcinoma, grade 2, estrogen and progesterone receptor positive, HER-2 nonamplified, with an MIB-1 of 5%.  (1) history of stage IA grade 1 endometrioid endometrial cancer, status post robotic assisted total hysterectomy with bilateral salpingo-oophorectomy, sentinel lymph node biopsy and left pelvic lymphadenectomy 07/17/2018  (a) given good prognosis no adjuvant therapy required for the endometrial cancer  (2) genetics testing 03/26/2020 through the CancerNext-Expanded gene panel offered by [facility] found no deleterious mutations in  AIP, ALK,  APC*, ATM*, AXIN2, BAP1, BARD1, BLM, BMPR1A, BRCA1*, BRCA2*, BRIP1*, CDC73, CDH1*, CDK4, CDKN1B, CDKN2A, CHEK2*, CTNNA1, DICER1, FANCC, FH, FLCN, GALNT12, KIF1B, LZTR1, MAX, MEN1, MET, MLH1*, MSH2*, MSH3, MSH6*, MUTYH*, NBN, NF1*, NF2, NTHL1, PALB2*, PHOX2B, PMS2*, POT1, PRKAR1A, PTCH1, PTEN*, RAD51C*, RAD51D*, RB1, RECQL, RET, SDHA, SDHAF2, SDHB, SDHC, SDHD, SMAD4, SMARCA4, SMARCB1, SMARCE1, STK11, SUFU, TMEM127, TP53*, TSC1, TSC2, VHL and XRCC2 (sequencing and deletion/duplication); EGFR, EGLN1, HOXB13,  KIT, MITF, PDGFRA, POLD1, and POLE (sequencing only); EPCAM and GREM1 (deletion/duplication only). DNA and RNA analyses performed for * genes. The report date is April 01, 2020. Negative genetic testing on the BRCAplus panel test.  The BRCAPlus gene panel offered by [facility] and includes sequencing and rearrangement analysis for the following 6 genes: BRCA1, BRCA2, CDH1, PALB2, PTEN, and TP53.     (a) PMS2 VUS identified.   (3) status post left mastectomy and sentinel lymph node sampling 04/19/2020 for an mpT1c N0, stage IA invasive ductal carcinoma, grade 1-2, with negative margin  (a) a total of 3 left axillary lymph nodes removed  (4) anastrozole started 05/26/2020, discontinued October 2022 with arthralgias and myalgias  (5) exemestane started November 2022   PLAN: Elizabeth [facility]ado was not able to tolerate either steroidal or nonsteroidal aromatase inhibitors.  Accordingly we are going to give tamoxifen a try.  We discussed the possible toxicities side effects and complications of this agent.  Since she is scheduled for surgery 01/10/2021 I thought it would be prudent to wait until February to start so the start date for tamoxifen for her will be 02/02/2021.  She will see [place] in person in March to meet her new medical oncologist and also to decide whether or not she is tolerating tamoxifen well  Sarajane Jews C. Magrinat, MD 12/19/2020 1:53 PM Medical Oncology and Hematology [facility] [place], [place] [address] Tel. [phone]    Fax. [phone]   This document serves as a record of services personally performed by Lurline Del, MD. It was created on his behalf by Wilburn Mylar, a trained medical scribe. The creation of this record is based on the scribe's personal observations and the provider's statements to them.   I, Lurline Del MD, have reviewed the above documentation for accuracy and completeness, and I agree with the above.   *Total Encounter  Time as defined by the Centers for Medicare and Medicaid Services includes, in addition to the face-to-face time of a patient visit (documented in the note above) non-face-to-face time: obtaining and reviewing outside history, ordering and reviewing medications, tests or procedures, care coordination (communications with other health care professionals or caregivers) and documentation in the medical record.

## 2020-12-20 ENCOUNTER — Inpatient Hospital Stay: Payer: Medicare PPO | Admitting: Oncology

## 2020-12-20 ENCOUNTER — Inpatient Hospital Stay: Payer: Medicare PPO | Attending: Gynecologic Oncology | Admitting: Oncology

## 2020-12-20 DIAGNOSIS — Z170 Estrogen receptor positive status [ER+]: Secondary | ICD-10-CM | POA: Diagnosis not present

## 2020-12-20 DIAGNOSIS — Z806 Family history of leukemia: Secondary | ICD-10-CM

## 2020-12-20 DIAGNOSIS — Z801 Family history of malignant neoplasm of trachea, bronchus and lung: Secondary | ICD-10-CM

## 2020-12-20 DIAGNOSIS — C50412 Malignant neoplasm of upper-outer quadrant of left female breast: Secondary | ICD-10-CM | POA: Diagnosis not present

## 2020-12-20 DIAGNOSIS — Z803 Family history of malignant neoplasm of breast: Secondary | ICD-10-CM

## 2020-12-20 DIAGNOSIS — Z800 Family history of malignant neoplasm of digestive organs: Secondary | ICD-10-CM

## 2020-12-20 DIAGNOSIS — Z808 Family history of malignant neoplasm of other organs or systems: Secondary | ICD-10-CM | POA: Diagnosis not present

## 2020-12-20 DIAGNOSIS — Z8042 Family history of malignant neoplasm of prostate: Secondary | ICD-10-CM

## 2020-12-20 MED ORDER — TAMOXIFEN CITRATE 20 MG PO TABS: 20.0000 mg | ORAL_TABLET | Freq: Every day | ORAL | 4 refills | Status: AC

## 2020-12-22 ENCOUNTER — Encounter: Payer: Self-pay | Admitting: Surgical

## 2020-12-22 ENCOUNTER — Other Ambulatory Visit: Payer: Self-pay

## 2020-12-22 ENCOUNTER — Ambulatory Visit (INDEPENDENT_AMBULATORY_CARE_PROVIDER_SITE_OTHER): Payer: Medicare PPO | Admitting: Surgical

## 2020-12-22 VITALS — BP 126/70 | HR 80 | Ht 62.0 in | Wt 165.2 lb

## 2020-12-22 DIAGNOSIS — Z170 Estrogen receptor positive status [ER+]: Secondary | ICD-10-CM

## 2020-12-22 DIAGNOSIS — C50412 Malignant neoplasm of upper-outer quadrant of left female breast: Secondary | ICD-10-CM

## 2020-12-22 DIAGNOSIS — L987 Excessive and redundant skin and subcutaneous tissue: Secondary | ICD-10-CM

## 2020-12-22 MED ORDER — OXYCODONE HCL 5 MG PO CAPS: 5.0000 mg | ORAL_CAPSULE | Freq: Four times a day (QID) | ORAL | 0 refills | Status: AC | PRN

## 2020-12-22 MED ORDER — ONDANSETRON HCL 4 MG PO TABS: 4.0000 mg | ORAL_TABLET | Freq: Three times a day (TID) | ORAL | 0 refills | Status: DC | PRN

## 2020-12-22 NOTE — Progress Notes (Signed)
Patient ID: Elizabeth Maldonado, female    DOB: 01-08-1945, 75 y.o.   MRN: [id number]  Chief Complaint  Patient presents with   Pre-op Exam      ICD-10-CM   1. Excess skin of breast  L98.7     2. Malignant neoplasm of upper-outer quadrant of left breast in female, estrogen receptor positive ([place])  C50.412    Z17.0       History of Present Illness: Elizabeth Maldonado is a 75 y.o.  female  with a history of left mastectomy.  She presents for preoperative evaluation for upcoming procedure, excision of left excess mastectomy skin, scheduled for 01/10/2021 with Dr. Claudia Desanctis.  The patient has not had problems with anesthesia. No history of DVT/PE.  No family history of DVT/PE.  No family or personal history of bleeding or clotting disorders.  Patient is not currently taking any blood thinners.  No history of CVA/MI.  She does report that she had some postoperative bleeding, reports a hematoma.  PMH Significant for: Left mastectomy, hyperlipidemia on Repatha. She reports that she does currently use turmeric, ibuprofen, fish oil, co-Q10 and multivitamins regularly.  She was previously on anastrozole but has subsequently stopped this and is scheduled to begin tamoxifen after surgery.  She starts this on February 1.  Past Medical History: Allergies: Allergies  Allergen Reactions   Crestor [Rosuvastatin] Other (See Comments)    myalgia   Lipitor [Atorvastatin] Other (See Comments)    myalgia   Colestipol Hcl Other (See Comments)   Acetaminophen Itching and Other (See Comments)    "hyperactivity"   Vytorin [Ezetimibe-Simvastatin] Other (See Comments)    myalgia    Current Medications:  Current Outpatient Medications:    b complex vitamins tablet, Take 1 tablet by mouth daily with supper., Disp: , Rfl:    Calcium Carb-Cholecalciferol 500-400 MG-UNIT TABS, Take 1 tablet by mouth daily with supper., Disp: , Rfl:    Calcium Citrate-Vitamin D (CALCIUM + D PO), Take 1 tablet by mouth daily., Disp: ,  Rfl:    Cholecalciferol (VITAMIN D) 125 MCG (5000 UT) CAPS, Take 5,000 Units by mouth daily with supper., Disp: , Rfl:    Coenzyme Q10 (COQ10) 200 MG CAPS, Take 200 mg by mouth daily with supper., Disp: , Rfl:    levothyroxine (SYNTHROID, LEVOTHROID) 75 MCG tablet, Take 75 mcg by mouth daily before breakfast., Disp: , Rfl:    Multiple Vitamin (MULTIVITAMIN WITH MINERALS) TABS tablet, Take 1 tablet by mouth daily with supper., Disp: , Rfl:    ondansetron (ZOFRAN) 4 MG tablet, Take 1 tablet (4 mg total) by mouth every 8 (eight) hours as needed for nausea or vomiting., Disp: 20 tablet, Rfl: 0   oxycodone (OXY-IR) 5 MG capsule, Take 1 capsule (5 mg total) by mouth every 6 (six) hours as needed for up to 5 days., Disp: 20 capsule, Rfl: 0   REPATHA SURECLICK 350 MG/ML SOAJ, Inject 140 mg into the skin every 14 (fourteen) days., Disp: , Rfl:    tamoxifen (NOLVADEX) 20 MG tablet, Take 1 tablet (20 mg total) by mouth daily. Start Feb 02, 2021, Disp: 90 tablet, Rfl: 4   Tetrahydrozoline HCl (VISINE OP), Place 1 drop into both eyes daily as needed (irritation/itchy eyes)., Disp: , Rfl:    vitamin C (ASCORBIC ACID) 500 MG tablet, Take 500 mg by mouth daily with supper., Disp: , Rfl:   Past Medical Problems: Past Medical History:  Diagnosis Date   Breast cancer ([place])  02/12/2020   Diverticulosis    Elevated blood pressure reading    165/71 a pre-op appt, reports she has seen her primary care in the last few months and intheir office it was in the 419F systolic, was not started on meds at that time, says her bp has "been creeping up since all this stuff started" , denies acute cardiac symptoms    Family history of breast cancer    Family history of colon cancer    Family history of melanoma    Hearing loss    wears hearing aids   History of anemia as a child    History of hiatal hernia    Hypercholesteremia    diet controlled   Hyperlipidemia 02/16/2016   Hypothyroid    Hypothyroidism 02/16/2016     Past Surgical History: Past Surgical History:  Procedure Laterality Date   ABDOMINAL HYSTERECTOMY     APPENDECTOMY     BUNIONECTOMY     CATARACT EXTRACTION     CATARACT EXTRACTION, BILATERAL     CESAREAN SECTION     CHOLECYSTECTOMY     DILATION AND CURETTAGE OF UTERUS     EYE SURGERY     FOOT SURGERY Right 2011   bone graft    HYSTEROSCOPY WITH D & C N/A 06/11/2018   Procedure: DILATATION AND CURETTAGE /HYSTEROSCOPY WITH MYOSURE POLYPECTOMY;  Surgeon: Lavonia Drafts, MD;  Location: [facility];  Service: Gynecology;  Laterality: N/A;   MASTECTOMY W/ SENTINEL NODE BIOPSY Left 04/19/2020   Procedure: LEFT MASTECTOMY WITH SENTINEL LYMPH NODE BIOPSY;  Surgeon: Coralie Keens, MD;  Location: [facility];  Service: General;  Laterality: Left;   NASAL SEPTUM SURGERY     at age 31   ROBOTIC [place] Bilateral 07/17/2018   Procedure: XI ROBOTIC ASSISTED TOTAL HYSTERECTOMY WITH BILATERAL SALPINGO OOPHORECTOMY AND LEFT PELVIC LYMPADECTOMY;  Surgeon: Everitt Amber, MD;  Location: [facility];  Service: Gynecology;  Laterality: Bilateral;   SENTINEL NODE BIOPSY N/A 07/17/2018   Procedure: SENTINEL NODE BIOPSY;  Surgeon: Everitt Amber, MD;  Location: [facility];  Service: Gynecology;  Laterality: N/A;   TONSILLECTOMY     torn meniscus repair Right     Social History: Social History   Socioeconomic History   Marital status: Married    Spouse name: Not on file   Number of children: Not on file   Years of education: Not on file   Highest education level: Not on file  Occupational History   Not on file  Tobacco Use   Smoking status: Never   Smokeless tobacco: Never  Vaping Use   Vaping Use: Never used  Substance and Sexual Activity   Alcohol use: Not Currently    Comment: one to two beers a year   Drug use: No   Sexual activity: Not on file  Other Topics Concern   Not on file  Social History Narrative   Not on file   Social Determinants  of Health   Financial Resource Strain: Not on file  Food Insecurity: Not on file  Transportation Needs: Not on file  Physical Activity: Not on file  Stress: Not on file  Social Connections: Not on file  Intimate Partner Violence: Not At Risk   Fear of Current or Ex-Partner: No   Emotionally Abused: No   Physically Abused: No   Sexually Abused: No    Family History: Family History  Problem Relation Age of Onset   Hypertension Mother  Heart attack Mother    Heart attack Father    Lung cancer Father    Skin cancer Father    Diabetes Sister    Cervical cancer Sister    Skin cancer Sister    Diabetes Brother    CAD Brother    Lymphoma Brother    Skin cancer Brother    Diabetes Brother    Melanoma Brother    Melanoma Brother    Prostate cancer Brother    Diverticulitis Brother    Skin cancer Brother    Skin cancer Sister    Breast cancer Maternal Aunt 35   Colon cancer Paternal Grandmother    Lung cancer Maternal Aunt    Leukemia Cousin    Lung cancer Cousin     Review of Systems: Review of Systems  Constitutional: Negative.   Respiratory: Negative.    Cardiovascular: Negative.   Gastrointestinal: Negative.   Musculoskeletal: Negative.   Neurological: Negative.    Physical Exam: Vital Signs BP 126/70 (BP Location: Left Arm, Patient Position: Sitting, Cuff Size: Large)    Pulse 80    Ht 5\' 2"  (1.575 m)    Wt 165 lb 3.2 oz (74.9 kg)    LMP  (LMP Unknown)    SpO2 96%    BMI 30.22 kg/m   Physical Exam  Constitutional:      General: Not in acute distress.    Appearance: Normal appearance. Not ill-appearing.  HENT:     Head: Normocephalic and atraumatic.  Eyes:     Pupils: Pupils are equal, round Neck:     Musculoskeletal: Normal range of motion.  Cardiovascular:     Rate and Rhythm: Normal rate    Pulses: Normal pulses.  Pulmonary:     Effort: Pulmonary effort is normal. No respiratory distress.  Musculoskeletal: Normal range of motion.  Skin:     General: Skin is warm and dry.     Findings: No erythema or rash.  Neurological:     General: No focal deficit present.     Mental Status: Alert and oriented to person, place, and time. Mental status is at baseline.     Motor: No weakness.  Psychiatric:        Mood and Affect: Mood normal.        Behavior: Behavior normal.    Assessment/Plan: The patient is scheduled for excision of excess left mastectomy skin with Dr. Claudia Desanctis.  Risks, benefits, and alternatives of procedure discussed, questions answered and consent obtained.    Smoking Status: Non-smoker; Counseling Given?  N/A  Caprini Score: 8, high; Risk Factors include: Age, BMI greater than 25, history of breast cancer, varicose veins and length of planned surgery. Recommendation for mechanical prophylaxis. Encourage early ambulation.  Will discuss need for postop Lovenox with Dr. Claudia Desanctis.  Given her overall status and functionality, will likely hold postoperative blood thinners.  Pictures obtained: @consult   Post-op Rx sent to pharmacy: [facility], [place]  Patient was provided with the General Surgical Risk consent document and Pain Medication Agreement prior to their appointment.  They had adequate time to read through the risk consent documents and Pain Medication Agreement. We also discussed them in person together during this preop appointment. All of their questions were answered to their satisfaction.  Recommended calling if they have any further questions.  Risk consent form and Pain Medication Agreement to be scanned into patient's chart.  We discussed risks associated with excision of excess left mastectomy skin which included nerve changes, nerve pain,  postoperative wounds, risk of bleeding, infection, damage to surrounding structures.  We discussed the possible need for postoperative drain.  Patient was understanding and in agreement.  Recommend holding turmeric, co-Q10, fish oil, ibuprofen and multivitamins prior to  surgery.   Electronically signed by: Carola Rhine Dixie Jafri, PA-C 12/22/2020 3:45 PM

## 2020-12-22 NOTE — H&P (View-Only) (Signed)
Patient ID: Elizabeth Maldonado, female    DOB: September 28, 1945, 75 y.o.   MRN: [id number]  Chief Complaint  Patient presents with   Pre-op Exam      ICD-10-CM   1. Excess skin of breast  L98.7     2. Malignant neoplasm of upper-outer quadrant of left breast in female, estrogen receptor positive ([place])  C50.412    Z17.0       History of Present Illness: TAMMEE Maldonado is a 75 y.o.  female  with a history of left mastectomy.  She presents for preoperative evaluation for upcoming procedure, excision of left excess mastectomy skin, scheduled for 01/10/2021 with Dr. Claudia Desanctis.  The patient has not had problems with anesthesia. No history of DVT/PE.  No family history of DVT/PE.  No family or personal history of bleeding or clotting disorders.  Patient is not currently taking any blood thinners.  No history of CVA/MI.  She does report that she had some postoperative bleeding, reports a hematoma.  PMH Significant for: Left mastectomy, hyperlipidemia on Repatha. She reports that she does currently use turmeric, ibuprofen, fish oil, co-Q10 and multivitamins regularly.  She was previously on anastrozole but has subsequently stopped this and is scheduled to begin tamoxifen after surgery.  She starts this on February 1.  Past Medical History: Allergies: Allergies  Allergen Reactions   Crestor [Rosuvastatin] Other (See Comments)    myalgia   Lipitor [Atorvastatin] Other (See Comments)    myalgia   Colestipol Hcl Other (See Comments)   Acetaminophen Itching and Other (See Comments)    "hyperactivity"   Vytorin [Ezetimibe-Simvastatin] Other (See Comments)    myalgia    Current Medications:  Current Outpatient Medications:    b complex vitamins tablet, Take 1 tablet by mouth daily with supper., Disp: , Rfl:    Calcium Carb-Cholecalciferol 500-400 MG-UNIT TABS, Take 1 tablet by mouth daily with supper., Disp: , Rfl:    Calcium Citrate-Vitamin D (CALCIUM + D PO), Take 1 tablet by mouth daily., Disp: ,  Rfl:    Cholecalciferol (VITAMIN D) 125 MCG (5000 UT) CAPS, Take 5,000 Units by mouth daily with supper., Disp: , Rfl:    Coenzyme Q10 (COQ10) 200 MG CAPS, Take 200 mg by mouth daily with supper., Disp: , Rfl:    levothyroxine (SYNTHROID, LEVOTHROID) 75 MCG tablet, Take 75 mcg by mouth daily before breakfast., Disp: , Rfl:    Multiple Vitamin (MULTIVITAMIN WITH MINERALS) TABS tablet, Take 1 tablet by mouth daily with supper., Disp: , Rfl:    ondansetron (ZOFRAN) 4 MG tablet, Take 1 tablet (4 mg total) by mouth every 8 (eight) hours as needed for nausea or vomiting., Disp: 20 tablet, Rfl: 0   oxycodone (OXY-IR) 5 MG capsule, Take 1 capsule (5 mg total) by mouth every 6 (six) hours as needed for up to 5 days., Disp: 20 capsule, Rfl: 0   REPATHA SURECLICK 222 MG/ML SOAJ, Inject 140 mg into the skin every 14 (fourteen) days., Disp: , Rfl:    tamoxifen (NOLVADEX) 20 MG tablet, Take 1 tablet (20 mg total) by mouth daily. Start Feb 02, 2021, Disp: 90 tablet, Rfl: 4   Tetrahydrozoline HCl (VISINE OP), Place 1 drop into both eyes daily as needed (irritation/itchy eyes)., Disp: , Rfl:    vitamin C (ASCORBIC ACID) 500 MG tablet, Take 500 mg by mouth daily with supper., Disp: , Rfl:   Past Medical Problems: Past Medical History:  Diagnosis Date   Breast cancer ([place])  02/12/2020   Diverticulosis    Elevated blood pressure reading    165/71 a pre-op appt, reports she has seen her primary care in the last few months and intheir office it was in the 086V systolic, was not started on meds at that time, says her bp has "been creeping up since all this stuff started" , denies acute cardiac symptoms    Family history of breast cancer    Family history of colon cancer    Family history of melanoma    Hearing loss    wears hearing aids   History of anemia as a child    History of hiatal hernia    Hypercholesteremia    diet controlled   Hyperlipidemia 02/16/2016   Hypothyroid    Hypothyroidism 02/16/2016     Past Surgical History: Past Surgical History:  Procedure Laterality Date   ABDOMINAL HYSTERECTOMY     APPENDECTOMY     BUNIONECTOMY     CATARACT EXTRACTION     CATARACT EXTRACTION, BILATERAL     CESAREAN SECTION     CHOLECYSTECTOMY     DILATION AND CURETTAGE OF UTERUS     EYE SURGERY     FOOT SURGERY Right 2011   bone graft    HYSTEROSCOPY WITH D & C N/A 06/11/2018   Procedure: DILATATION AND CURETTAGE /HYSTEROSCOPY WITH MYOSURE POLYPECTOMY;  Surgeon: Lavonia Drafts, MD;  Location: [facility];  Service: Gynecology;  Laterality: N/A;   MASTECTOMY W/ SENTINEL NODE BIOPSY Left 04/19/2020   Procedure: LEFT MASTECTOMY WITH SENTINEL LYMPH NODE BIOPSY;  Surgeon: Coralie Keens, MD;  Location: [facility];  Service: General;  Laterality: Left;   NASAL SEPTUM SURGERY     at age 20   ROBOTIC [place] Bilateral 07/17/2018   Procedure: XI ROBOTIC ASSISTED TOTAL HYSTERECTOMY WITH BILATERAL SALPINGO OOPHORECTOMY AND LEFT PELVIC LYMPADECTOMY;  Surgeon: Everitt Amber, MD;  Location[facility];  Service: Gynecology;  Laterality: Bilateral;   SENTINEL NODE BIOPSY N/A 07/17/2018   Procedure: SENTINEL NODE BIOPSY;  Surgeon: Everitt Amber, MD;  Location[facility];  Service: Gynecology;  Laterality: N/A;   TONSILLECTOMY     torn meniscus repair Right     Social History: Social History   Socioeconomic History   Marital status: Married    Spouse name: Not on file   Number of children: Not on file   Years of education: Not on file   Highest education level: Not on file  Occupational History   Not on file  Tobacco Use   Smoking status: Never   Smokeless tobacco: Never  Vaping Use   Vaping Use: Never used  Substance and Sexual Activity   Alcohol use: Not Currently    Comment: one to two beers a year   Drug use: No   Sexual activity: Not on file  Other Topics Concern   Not on file  Social History Narrative   Not on file   Social Determinants  of Health   Financial Resource Strain: Not on file  Food Insecurity: Not on file  Transportation Needs: Not on file  Physical Activity: Not on file  Stress: Not on file  Social Connections: Not on file  Intimate Partner Violence: Not At Risk   Fear of Current or Ex-Partner: No   Emotionally Abused: No   Physically Abused: No   Sexually Abused: No    Family History: Family History  Problem Relation Age of Onset   Hypertension Mother  Heart attack Mother    Heart attack Father    Lung cancer Father    Skin cancer Father    Diabetes Sister    Cervical cancer Sister    Skin cancer Sister    Diabetes Brother    CAD Brother    Lymphoma Brother    Skin cancer Brother    Diabetes Brother    Melanoma Brother    Melanoma Brother    Prostate cancer Brother    Diverticulitis Brother    Skin cancer Brother    Skin cancer Sister    Breast cancer Maternal Aunt 35   Colon cancer Paternal Grandmother    Lung cancer Maternal Aunt    Leukemia Cousin    Lung cancer Cousin     Review of Systems: Review of Systems  Constitutional: Negative.   Respiratory: Negative.    Cardiovascular: Negative.   Gastrointestinal: Negative.   Musculoskeletal: Negative.   Neurological: Negative.    Physical Exam: Vital Signs BP 126/70 (BP Location: Left Arm, Patient Position: Sitting, Cuff Size: Large)    Pulse 80    Ht 5\' 2"  (1.575 m)    Wt 165 lb 3.2 oz (74.9 kg)    LMP  (LMP Unknown)    SpO2 96%    BMI 30.22 kg/m   Physical Exam  Constitutional:      General: Not in acute distress.    Appearance: Normal appearance. Not ill-appearing.  HENT:     Head: Normocephalic and atraumatic.  Eyes:     Pupils: Pupils are equal, round Neck:     Musculoskeletal: Normal range of motion.  Cardiovascular:     Rate and Rhythm: Normal rate    Pulses: Normal pulses.  Pulmonary:     Effort: Pulmonary effort is normal. No respiratory distress.  Musculoskeletal: Normal range of motion.  Skin:     General: Skin is warm and dry.     Findings: No erythema or rash.  Neurological:     General: No focal deficit present.     Mental Status: Alert and oriented to person, place, and time. Mental status is at baseline.     Motor: No weakness.  Psychiatric:        Mood and Affect: Mood normal.        Behavior: Behavior normal.    Assessment/Plan: The patient is scheduled for excision of excess left mastectomy skin with Dr. Claudia Desanctis.  Risks, benefits, and alternatives of procedure discussed, questions answered and consent obtained.    Smoking Status: Non-smoker; Counseling Given?  N/A  Caprini Score: 8, high; Risk Factors include: Age, BMI greater than 25, history of breast cancer, varicose veins and length of planned surgery. Recommendation for mechanical prophylaxis. Encourage early ambulation.  Will discuss need for postop Lovenox with Dr. Claudia Desanctis.  Given her overall status and functionality, will likely hold postoperative blood thinners.  Pictures obtained: @consult   Post-op Rx sent to pharmacy: [facility], [place]  Patient was provided with the General Surgical Risk consent document and Pain Medication Agreement prior to their appointment.  They had adequate time to read through the risk consent documents and Pain Medication Agreement. We also discussed them in person together during this preop appointment. All of their questions were answered to their satisfaction.  Recommended calling if they have any further questions.  Risk consent form and Pain Medication Agreement to be scanned into patient's chart.  We discussed risks associated with excision of excess left mastectomy skin which included nerve changes, nerve pain,  postoperative wounds, risk of bleeding, infection, damage to surrounding structures.  We discussed the possible need for postoperative drain.  Patient was understanding and in agreement.  Recommend holding turmeric, co-Q10, fish oil, ibuprofen and multivitamins prior to  surgery.   Electronically signed by: Carola Rhine Rondarius Kadrmas, PA-C 12/22/2020 3:45 PM

## 2020-12-29 ENCOUNTER — Encounter (HOSPITAL_BASED_OUTPATIENT_CLINIC_OR_DEPARTMENT_OTHER): Payer: Self-pay | Admitting: Plastic Surgery

## 2020-12-29 ENCOUNTER — Other Ambulatory Visit: Payer: Self-pay

## 2021-01-10 ENCOUNTER — Ambulatory Visit (HOSPITAL_BASED_OUTPATIENT_CLINIC_OR_DEPARTMENT_OTHER): Payer: Medicare PPO | Admitting: Certified Registered"

## 2021-01-10 ENCOUNTER — Ambulatory Visit (HOSPITAL_BASED_OUTPATIENT_CLINIC_OR_DEPARTMENT_OTHER)
Admission: RE | Admit: 2021-01-10 | Discharge: 2021-01-10 | Disposition: A | Discharge status: Home / Self Care | Payer: Medicare PPO | Attending: Plastic Surgery | Admitting: Plastic Surgery

## 2021-01-10 ENCOUNTER — Other Ambulatory Visit: Payer: Self-pay

## 2021-01-10 ENCOUNTER — Encounter (HOSPITAL_BASED_OUTPATIENT_CLINIC_OR_DEPARTMENT_OTHER)
Admission: RE | Disposition: A | Discharge status: Home / Self Care | Payer: Self-pay | Source: Home / Self Care | Attending: Plastic Surgery

## 2021-01-10 ENCOUNTER — Encounter (HOSPITAL_BASED_OUTPATIENT_CLINIC_OR_DEPARTMENT_OTHER): Payer: Self-pay | Admitting: Plastic Surgery

## 2021-01-10 DIAGNOSIS — Z9012 Acquired absence of left breast and nipple: Secondary | ICD-10-CM | POA: Insufficient documentation

## 2021-01-10 DIAGNOSIS — L7622 Postprocedural hemorrhage and hematoma of skin and subcutaneous tissue following other procedure: Secondary | ICD-10-CM | POA: Diagnosis not present

## 2021-01-10 DIAGNOSIS — Z9229 Personal history of other drug therapy: Secondary | ICD-10-CM | POA: Insufficient documentation

## 2021-01-10 DIAGNOSIS — Z853 Personal history of malignant neoplasm of breast: Secondary | ICD-10-CM | POA: Diagnosis not present

## 2021-01-10 DIAGNOSIS — Z6830 Body mass index (BMI) 30.0-30.9, adult: Secondary | ICD-10-CM | POA: Insufficient documentation

## 2021-01-10 DIAGNOSIS — Z8249 Family history of ischemic heart disease and other diseases of the circulatory system: Secondary | ICD-10-CM | POA: Diagnosis not present

## 2021-01-10 DIAGNOSIS — I8390 Asymptomatic varicose veins of unspecified lower extremity: Secondary | ICD-10-CM | POA: Diagnosis not present

## 2021-01-10 DIAGNOSIS — E785 Hyperlipidemia, unspecified: Secondary | ICD-10-CM | POA: Insufficient documentation

## 2021-01-10 DIAGNOSIS — L987 Excessive and redundant skin and subcutaneous tissue: Secondary | ICD-10-CM | POA: Insufficient documentation

## 2021-01-10 DIAGNOSIS — Z803 Family history of malignant neoplasm of breast: Secondary | ICD-10-CM | POA: Insufficient documentation

## 2021-01-10 DIAGNOSIS — Z791 Long term (current) use of non-steroidal anti-inflammatories (NSAID): Secondary | ICD-10-CM | POA: Insufficient documentation

## 2021-01-10 DIAGNOSIS — L7632 Postprocedural hematoma of skin and subcutaneous tissue following other procedure: Secondary | ICD-10-CM | POA: Insufficient documentation

## 2021-01-10 DIAGNOSIS — Z79899 Other long term (current) drug therapy: Secondary | ICD-10-CM | POA: Insufficient documentation

## 2021-01-10 HISTORY — PX: MASS EXCISION: SHX2000

## 2021-01-10 SURGERY — EXCISION MASS
Anesthesia: General | Site: Breast | Laterality: Left

## 2021-01-10 MED ORDER — OXYCODONE HCL 5 MG/5ML PO SOLN: 5.0000 mg | Freq: Once | ORAL | Status: AC | PRN

## 2021-01-10 MED ORDER — FENTANYL CITRATE (PF) 100 MCG/2ML IJ SOLN
INTRAMUSCULAR | Status: DC | PRN
  Administered 2021-01-10: 50 ug via INTRAVENOUS
  Administered 2021-01-10 (×3): 25 ug via INTRAVENOUS

## 2021-01-10 MED ORDER — 0.9 % SODIUM CHLORIDE (POUR BTL) OPTIME
TOPICAL | Status: DC | PRN
Start: 2021-01-10 — End: 2021-01-10
  Administered 2021-01-10: 500 mL

## 2021-01-10 MED ORDER — CHLORHEXIDINE GLUCONATE CLOTH 2 % EX PADS: 6.0000 | MEDICATED_PAD | Freq: Once | CUTANEOUS | Status: DC

## 2021-01-10 MED ORDER — EPINEPHRINE PF 1 MG/ML IJ SOLN
INTRAMUSCULAR | Status: AC
  Filled 2021-01-10: qty 1

## 2021-01-10 MED ORDER — FENTANYL CITRATE (PF) 100 MCG/2ML IJ SOLN
INTRAMUSCULAR | Status: AC
  Filled 2021-01-10: qty 2

## 2021-01-10 MED ORDER — FENTANYL CITRATE (PF) 100 MCG/2ML IJ SOLN
25.0000 ug | INTRAMUSCULAR | Status: DC | PRN
  Administered 2021-01-10 (×4): 50 ug via INTRAVENOUS

## 2021-01-10 MED ORDER — OXYCODONE HCL 5 MG PO TABS
ORAL_TABLET | ORAL | Status: AC
  Filled 2021-01-10: qty 1

## 2021-01-10 MED ORDER — LACTATED RINGERS IV SOLN: INTRAVENOUS | Status: DC

## 2021-01-10 MED ORDER — CEFAZOLIN SODIUM-DEXTROSE 2-4 GM/100ML-% IV SOLN
INTRAVENOUS | Status: AC
  Filled 2021-01-10: qty 100

## 2021-01-10 MED ORDER — DEXMEDETOMIDINE (PRECEDEX) IN NS 20 MCG/5ML (4 MCG/ML) IV SYRINGE
PREFILLED_SYRINGE | INTRAVENOUS | Status: AC
  Filled 2021-01-10: qty 5

## 2021-01-10 MED ORDER — LACTATED RINGERS IV SOLN
INTRAVENOUS | Status: DC | PRN
  Administered 2021-01-10: 1000 mL

## 2021-01-10 MED ORDER — BUPIVACAINE HCL (PF) 0.25 % IJ SOLN
INTRAMUSCULAR | Status: AC
  Filled 2021-01-10: qty 30

## 2021-01-10 MED ORDER — LIDOCAINE 2% (20 MG/ML) 5 ML SYRINGE
INTRAMUSCULAR | Status: DC | PRN
  Administered 2021-01-10: 60 mg via INTRAVENOUS

## 2021-01-10 MED ORDER — CEFAZOLIN SODIUM-DEXTROSE 2-4 GM/100ML-% IV SOLN
2.0000 g | INTRAVENOUS | Status: AC
  Administered 2021-01-10: 2 g via INTRAVENOUS

## 2021-01-10 MED ORDER — PROPOFOL 500 MG/50ML IV EMUL
INTRAVENOUS | Status: DC | PRN
  Administered 2021-01-10: 25 ug/kg/min via INTRAVENOUS

## 2021-01-10 MED ORDER — PROPOFOL 10 MG/ML IV BOLUS
INTRAVENOUS | Status: DC | PRN
  Administered 2021-01-10: 160 mg via INTRAVENOUS

## 2021-01-10 MED ORDER — OXYCODONE HCL 5 MG PO TABS
5.0000 mg | ORAL_TABLET | Freq: Once | ORAL | Status: AC | PRN
  Administered 2021-01-10: 5 mg via ORAL

## 2021-01-10 MED ORDER — DEXAMETHASONE SODIUM PHOSPHATE 4 MG/ML IJ SOLN
INTRAMUSCULAR | Status: DC | PRN
  Administered 2021-01-10: 5 mg via INTRAVENOUS

## 2021-01-10 SURGICAL SUPPLY — 73 items
APL PRP STRL LF DISP 70% ISPRP (MISCELLANEOUS) ×1
APL SKNCLS STERI-STRIP NONHPOA (GAUZE/BANDAGES/DRESSINGS)
BAND INSRT 18 STRL LF DISP RB (MISCELLANEOUS)
BAND RUBBER #18 3X1/16 STRL (MISCELLANEOUS) IMPLANT
BENZOIN TINCTURE PRP APPL 2/3 (GAUZE/BANDAGES/DRESSINGS) IMPLANT
BINDER BREAST XLRG (GAUZE/BANDAGES/DRESSINGS) ×1 IMPLANT
BLADE CLIPPER SURG (BLADE) IMPLANT
BLADE SURG 15 STRL LF DISP TIS (BLADE) ×1 IMPLANT
BLADE SURG 15 STRL SS (BLADE) ×2
CANISTER SUCT 1200ML W/VALVE (MISCELLANEOUS) IMPLANT
CHLORAPREP W/TINT 26 (MISCELLANEOUS) ×2 IMPLANT
COVER BACK TABLE 60X90IN (DRAPES) ×2 IMPLANT
COVER MAYO STAND STRL (DRAPES) ×2 IMPLANT
DRAIN JP 10F RND SILICONE (MISCELLANEOUS) IMPLANT
DRAPE LAPAROTOMY 100X72 PEDS (DRAPES) IMPLANT
DRAPE U-SHAPE 76X120 STRL (DRAPES) IMPLANT
DRAPE UTILITY XL STRL (DRAPES) ×2 IMPLANT
DRSG TELFA 3X8 NADH (GAUZE/BANDAGES/DRESSINGS) IMPLANT
ELECT COATED BLADE 2.86 ST (ELECTRODE) IMPLANT
ELECT NDL BLADE 2-5/6 (NEEDLE) ×1 IMPLANT
ELECT NEEDLE BLADE 2-5/6 (NEEDLE) ×2 IMPLANT
ELECT REM PT RETURN 9FT ADLT (ELECTROSURGICAL)
ELECT REM PT RETURN 9FT PED (ELECTROSURGICAL)
ELECTRODE REM PT RETRN 9FT PED (ELECTROSURGICAL) IMPLANT
ELECTRODE REM PT RTRN 9FT ADLT (ELECTROSURGICAL) IMPLANT
EVACUATOR SILICONE 100CC (DRAIN) IMPLANT
GAUZE SPONGE 4X4 12PLY STRL LF (GAUZE/BANDAGES/DRESSINGS) IMPLANT
GAUZE XEROFORM 1X8 LF (GAUZE/BANDAGES/DRESSINGS) IMPLANT
GLOVE SRG 8 PF TXTR STRL LF DI (GLOVE) ×1 IMPLANT
GLOVE SURG ENC TEXT LTX SZ7.5 (GLOVE) ×2 IMPLANT
GLOVE SURG UNDER POLY LF SZ8 (GLOVE) ×2
GOWN STRL REUS W/ TWL LRG LVL3 (GOWN DISPOSABLE) ×2 IMPLANT
GOWN STRL REUS W/TWL LRG LVL3 (GOWN DISPOSABLE) ×4
NDL FILTER BLUNT 18X1 1/2 (NEEDLE) IMPLANT
NDL HYPO 27GX1-1/4 (NEEDLE) ×1 IMPLANT
NDL HYPO 30GX1 BEV (NEEDLE) IMPLANT
NDL SAFETY ECLIPSE 18X1.5 (NEEDLE) IMPLANT
NEEDLE FILTER BLUNT 18X 1/2SAF (NEEDLE)
NEEDLE FILTER BLUNT 18X1 1/2 (NEEDLE) IMPLANT
NEEDLE HYPO 18GX1.5 SHARP (NEEDLE)
NEEDLE HYPO 27GX1-1/4 (NEEDLE) ×2 IMPLANT
NEEDLE HYPO 30GX1 BEV (NEEDLE) IMPLANT
NS IRRIG 1000ML POUR BTL (IV SOLUTION) IMPLANT
PACK BASIN DAY SURGERY FS (CUSTOM PROCEDURE TRAY) ×2 IMPLANT
PAD DRESSING TELFA 3X8 NADH (GAUZE/BANDAGES/DRESSINGS) IMPLANT
PENCIL SMOKE EVACUATOR (MISCELLANEOUS) ×2 IMPLANT
SHEET MEDIUM DRAPE 40X70 STRL (DRAPES) IMPLANT
SLEEVE SCD COMPRESS KNEE MED (STOCKING) IMPLANT
SPONGE GAUZE 2X2 8PLY STRL LF (GAUZE/BANDAGES/DRESSINGS) IMPLANT
SPONGE T-LAP 18X18 ~~LOC~~+RFID (SPONGE) IMPLANT
STAPLER INSORB 30 2030 C-SECTI (MISCELLANEOUS) IMPLANT
STAPLER VISISTAT 35W (STAPLE) ×2 IMPLANT
STRIP CLOSURE SKIN 1/2X4 (GAUZE/BANDAGES/DRESSINGS) IMPLANT
SUCTION FRAZIER HANDLE 10FR (MISCELLANEOUS)
SUCTION TUBE FRAZIER 10FR DISP (MISCELLANEOUS) IMPLANT
SUT ETHILON 4 0 PS 2 18 (SUTURE) IMPLANT
SUT MNCRL AB 4-0 PS2 18 (SUTURE) IMPLANT
SUT MON AB 5-0 P3 18 (SUTURE) IMPLANT
SUT PDS 3-0 CT2 (SUTURE)
SUT PDS II 3-0 CT2 27 ABS (SUTURE) IMPLANT
SUT PLAIN 5 0 P 3 18 (SUTURE) IMPLANT
SUT PROLENE 5 0 P 3 (SUTURE) IMPLANT
SUT VICRYL 4-0 PS2 18IN ABS (SUTURE) IMPLANT
SUT VLOC 90 P-14 23 (SUTURE) ×2 IMPLANT
SWAB COLLECTION DEVICE MRSA (MISCELLANEOUS) IMPLANT
SWAB CULTURE ESWAB REG 1ML (MISCELLANEOUS) IMPLANT
SYR 50ML LL SCALE MARK (SYRINGE) IMPLANT
SYR BULB EAR ULCER 3OZ GRN STR (SYRINGE) IMPLANT
SYR CONTROL 10ML LL (SYRINGE) ×2 IMPLANT
TOWEL GREEN STERILE FF (TOWEL DISPOSABLE) ×2 IMPLANT
TRAY DSU PREP LF (CUSTOM PROCEDURE TRAY) IMPLANT
TUBE CONNECTING 20X1/4 (TUBING) IMPLANT
TUBING INFILTRATION IT-10001 (TUBING) IMPLANT

## 2021-01-10 NOTE — Anesthesia Postprocedure Evaluation (Signed)
Anesthesia Post Note  Patient: Elizabeth Maldonado  Procedure(s) Performed: Excision of left excess mastectomy skin (Left: Breast)     Patient location during evaluation: PACU Anesthesia Type: General Level of consciousness: awake and alert Pain management: pain level controlled Vital Signs Assessment: post-procedure vital signs reviewed and stable Respiratory status: spontaneous breathing, nonlabored ventilation and respiratory function stable Cardiovascular status: blood pressure returned to baseline and stable Postop Assessment: no apparent nausea or vomiting Anesthetic complications: no   No notable events documented.  Last Vitals:  Vitals:   01/10/21 1045 01/10/21 1125  BP: 120/72 (!) 142/79  Pulse: 84 77  Resp: 15 18  Temp:  36.7 C  SpO2: 98% 95%    Last Pain:  Vitals:   01/10/21 1030  TempSrc:   PainSc: [place]

## 2021-01-10 NOTE — Interval H&P Note (Signed)
Patient seen and examined. Risks and benefits discussed. Proceed with surgery.

## 2021-01-10 NOTE — Anesthesia Procedure Notes (Signed)
Procedure Name: LMA Insertion Date/Time: 01/10/2021 8:42 AM Performed by: Signe Colt, CRNA Pre-anesthesia Checklist: Patient identified, Emergency Drugs available, Suction available and Patient being monitored Patient Re-evaluated:Patient Re-evaluated prior to induction Oxygen Delivery Method: Circle System Utilized Preoxygenation: Pre-oxygenation with 100% oxygen Induction Type: IV induction Ventilation: Mask ventilation without difficulty LMA: LMA inserted LMA Size: 4.0 Number of attempts: 1 Airway Equipment and Method: bite block Placement Confirmation: positive ETCO2 Tube secured with: Tape Dental Injury: Teeth and Oropharynx as per pre-operative assessment

## 2021-01-10 NOTE — Discharge Instructions (Addendum)
Activity: Avoid strenuous activity.  No heavy lifting.  Diet: No restrictions.  Try to optimize nutrition with plenty of fruits and vegetables to improve healing.  Wound Care: Leave breast binder wrap for 3 days.  You may then remove and shower normally.  After breast binder comes off recommend sports bra for gentle compression.  Avoid any bra with under-wire until cleared by Dr. Claudia Desanctis.  Follow-Up: Scheduled for next week.  Things to watch for:  Call the office if you experience fever, chills, persistent nausea, or significant bleeding.  Mild wound drainage is common after breast reduction surgery and should not be cause for alarm.    Post Anesthesia Home Care Instructions  Activity: Get plenty of rest for the remainder of the day. A responsible individual must stay with you for 24 hours following the procedure.  For the next 24 hours, DO NOT: -Drive a car -Paediatric nurse -Drink alcoholic beverages -Take any medication unless instructed by your physician -Make any legal decisions or sign important papers.  Meals: Start with liquid foods such as gelatin or soup. Progress to regular foods as tolerated. Avoid greasy, spicy, heavy foods. If nausea and/or vomiting occur, drink only clear liquids until the nausea and/or vomiting subsides. Call your physician if vomiting continues.  Special Instructions/Symptoms: Your throat may feel dry or sore from the anesthesia or the breathing tube placed in your throat during surgery. If this causes discomfort, gargle with warm salt water. The discomfort should disappear within 24 hours.  If you had a scopolamine patch placed behind your ear for the management of post- operative nausea and/or vomiting:  1. The medication in the patch is effective for 72 hours, after which it should be removed.  Wrap patch in a tissue and discard in the trash. Wash hands thoroughly with soap and water. 2. You may remove the patch earlier than 72 hours if you experience  unpleasant side effects which may include dry mouth, dizziness or visual disturbances. 3. Avoid touching the patch. Wash your hands with soap and water after contact with the patch.    About my Jackson-Pratt Bulb Drain  What is a Jackson-Pratt bulb? A Jackson-Pratt is a soft, round device used to collect drainage. It is connected to a long, thin drainage catheter, which is held in place by one or two small stiches near your surgical incision site. When the bulb is squeezed, it forms a vacuum, forcing the drainage to empty into the bulb.  Emptying the Jackson-Pratt bulb- To empty the bulb: 1. Release the plug on the top of the bulb. 2. Pour the bulb's contents into a measuring container which your nurse will provide. 3. Record the time emptied and amount of drainage. Empty the drain(s) as often as your     doctor or nurse recommends.  Date                  Time                    Amount (Drain 1)                 Amount (Drain 2)  _____________________________________________________________________  _____________________________________________________________________  _____________________________________________________________________  _____________________________________________________________________  _____________________________________________________________________  _____________________________________________________________________  _____________________________________________________________________  _____________________________________________________________________  Squeezing the Jackson-Pratt Bulb- To squeeze the bulb: 1. Make sure the plug at the top of the bulb is open. 2. Squeeze the bulb tightly in your fist. You will hear air squeezing from the bulb. 3. Replace the plug while the  bulb is squeezed. 4. Use a safety pin to attach the bulb to your clothing. This will keep the catheter from     pulling at the bulb insertion site.  When to call your  doctor- Call your doctor if: Drain site becomes red, swollen or hot. You have a fever greater than 101 degrees F. There is oozing at the drain site. Drain falls out (apply a guaze bandage over the drain hole and secure it with tape). Drainage increases daily not related to activity patterns. (You will usually have more drainage when you are active than when you are resting.) Drainage has a bad odor.  You had 5mg  Oxycodone at 1123

## 2021-01-10 NOTE — Transfer of Care (Signed)
Immediate Anesthesia Transfer of Care Note  Patient: Elizabeth Maldonado  Procedure(s) Performed: Excision of left excess mastectomy skin (Left: Breast)  Patient Location: PACU  Anesthesia Type:General  Level of Consciousness: awake, alert , oriented and patient cooperative  Airway & Oxygen Therapy: Patient Spontanous Breathing and Patient connected to face mask oxygen  Post-op Assessment: Report given to RN and Post -op Vital signs reviewed and stable  Post vital signs: Reviewed and stable  Last Vitals:  Vitals Value Taken Time  BP 131/68 01/10/21 0957  Temp    Pulse 90 01/10/21 0958  Resp 17 01/10/21 0958  SpO2 99 % 01/10/21 0958  Vitals shown include unvalidated device data.  Last Pain:  Vitals:   01/10/21 0755  TempSrc: Oral  PainSc: 0-No pain      Patients Stated Pain Goal: 6 (25/67/20 9198)  Complications: No notable events documented.

## 2021-01-10 NOTE — Anesthesia Preprocedure Evaluation (Signed)
Anesthesia Evaluation  Patient identified by MRN, date of birth, ID band Patient awake    Reviewed: Allergy & Precautions, NPO status , Patient's Chart, lab work & pertinent test results  History of Anesthesia Complications Negative for: history of anesthetic complications  Airway Mallampati: III  TM Distance: >3 FB Neck ROM: Full    Dental  (+) Dental Advisory Given, Teeth Intact   Pulmonary neg pulmonary ROS, neg shortness of breath, neg sleep apnea, neg COPD, neg recent URI,    breath sounds clear to auscultation       Cardiovascular (-) anginanegative cardio ROS   Rhythm:Regular     Neuro/Psych negative neurological ROS  negative psych ROS   GI/Hepatic Neg liver ROS, hiatal hernia,   Endo/Other  Hypothyroidism   Renal/GU negative Renal ROS     Musculoskeletal negative musculoskeletal ROS (+)   Abdominal   Peds negative pediatric ROS (+)  Hematology negative hematology ROS (+)   Anesthesia Other Findings   Reproductive/Obstetrics                             Anesthesia Physical Anesthesia Plan  ASA: 2  Anesthesia Plan: General   Post-op Pain Management: Toradol IV (intra-op)   Induction: Intravenous  PONV Risk Score and Plan: 3 and Dexamethasone, Propofol infusion and Ondansetron  Airway Management Planned: LMA  Additional Equipment: None  Intra-op Plan:   Post-operative Plan: Extubation in OR  Informed Consent: I have reviewed the patients History and Physical, chart, labs and discussed the procedure including the risks, benefits and alternatives for the proposed anesthesia with the patient or authorized representative who has indicated his/her understanding and acceptance.     Dental advisory given  Plan Discussed with: CRNA and Anesthesiologist  Anesthesia Plan Comments:         Anesthesia Quick Evaluation

## 2021-01-10 NOTE — Op Note (Signed)
Operative Note   DATE OF OPERATION: 01/10/2021  SURGICAL DEPARTMENT: Plastic Surgery  PREOPERATIVE DIAGNOSES: Left chest excess skin  POSTOPERATIVE DIAGNOSES:  same  PROCEDURE: 1.  Excision left chest excess skin totaling 8 x 40 cm 2.  Complex closure left chest totaling 40 cm in length  SURGEON: Talmadge Coventry, MD  ASSISTANT: Krista Blue, PA The advanced practice practitioner (APP) assisted throughout the case.  The APP was essential in retraction and counter traction when needed to make the case progress smoothly.  This retraction and assistance made it possible to see the tissue planes for the procedure.  The assistance was needed for hemostasis, tissue re-approximation and closure of the incision site.   ANESTHESIA:  General.   COMPLICATIONS: None.   INDICATIONS FOR PROCEDURE:  The patient, Elizabeth Maldonado is a 76 y.o. female born on 04-19-1945, is here for treatment of left chest excess skin after mastectomy MRN: [id number]  CONSENT:  Informed consent was obtained directly from the patient. Risks, benefits and alternatives were fully discussed. Specific risks including but not limited to bleeding, infection, hematoma, seroma, scarring, pain, contracture, asymmetry, wound healing problems, and need for further surgery were all discussed. The patient did have an ample opportunity to have questions answered to satisfaction.   DESCRIPTION OF PROCEDURE:  The patient was taken to the operating room. SCDs were placed and antibiotics were given.  General anesthesia was administered.  The patient's operative site was prepped and draped in a sterile fashion. A time out was performed and all information was confirmed to be correct.  Started by marking out the planned excision.  This was with minor modifications compared to my preoperative marks with her in the standing position.  Tumescent solution was infiltrated.  This was given time to work.  Excision was then done with a knife.   Hemostasis was obtained with cautery.  Surrounding skin was undermined and advanced and closed in layers with interrupted buried in sorb staples and 3-0 PDS and running 3 oh V-Loc suture.  69 French drain was left in the wound and secured with a PDS suture.  Steri-Strips and a soft compressive dressing were applied.  Length of closure measured 40 cm in length.  This extended from the sternum out to the most posterior aspect of the axilla.  The patient tolerated the procedure well.  There were no complications. The patient was allowed to wake from anesthesia, extubated and taken to the recovery room in satisfactory condition.

## 2021-01-11 ENCOUNTER — Encounter (HOSPITAL_BASED_OUTPATIENT_CLINIC_OR_DEPARTMENT_OTHER): Payer: Self-pay | Admitting: Plastic Surgery

## 2021-01-12 LAB — SURGICAL PATHOLOGY

## 2021-01-19 ENCOUNTER — Other Ambulatory Visit: Payer: Self-pay

## 2021-01-19 ENCOUNTER — Ambulatory Visit (INDEPENDENT_AMBULATORY_CARE_PROVIDER_SITE_OTHER): Payer: Medicare PPO | Admitting: Plastic Surgery

## 2021-01-19 DIAGNOSIS — L987 Excessive and redundant skin and subcutaneous tissue: Secondary | ICD-10-CM

## 2021-01-19 NOTE — Progress Notes (Signed)
Patient presents 1 week postop from excision of left chest mastectomy skin.  She feels great.  She is overall very happy.  Her drain has been putting out very little and was removed today.  On exam her incision looks good and no signs of any skin compromise or wound healing problems.  We will plan to see her again in a few weeks to check her progress.  All of her questions were answered.

## 2021-02-02 DIAGNOSIS — C50912 Malignant neoplasm of unspecified site of left female breast: Secondary | ICD-10-CM | POA: Diagnosis not present

## 2021-02-02 DIAGNOSIS — R7303 Prediabetes: Secondary | ICD-10-CM | POA: Diagnosis not present

## 2021-02-02 DIAGNOSIS — Z1389 Encounter for screening for other disorder: Secondary | ICD-10-CM | POA: Diagnosis not present

## 2021-02-02 DIAGNOSIS — I700 Atherosclerosis of aorta: Secondary | ICD-10-CM | POA: Diagnosis not present

## 2021-02-02 DIAGNOSIS — E039 Hypothyroidism, unspecified: Secondary | ICD-10-CM | POA: Diagnosis not present

## 2021-02-02 DIAGNOSIS — Z0000 Encounter for general adult medical examination without abnormal findings: Secondary | ICD-10-CM | POA: Diagnosis not present

## 2021-02-02 DIAGNOSIS — E785 Hyperlipidemia, unspecified: Secondary | ICD-10-CM | POA: Diagnosis not present

## 2021-02-09 ENCOUNTER — Other Ambulatory Visit: Payer: Self-pay

## 2021-02-09 ENCOUNTER — Ambulatory Visit (INDEPENDENT_AMBULATORY_CARE_PROVIDER_SITE_OTHER): Payer: Medicare PPO | Admitting: Plastic Surgery

## 2021-02-09 DIAGNOSIS — L987 Excessive and redundant skin and subcutaneous tissue: Secondary | ICD-10-CM

## 2021-02-09 NOTE — Progress Notes (Signed)
Patient presents about a week out from excision of excess left mastectomy skin.  She is overall very happy with the contour and feels great.  On exam incision is healing nicely.  No signs of any wound healing problems.  Contour is significantly improved.  At this point we will plan to see her again on an as-needed basis.  All of her questions were answered.

## 2021-02-14 ENCOUNTER — Ambulatory Visit: Payer: Medicare PPO | Attending: Adult Health

## 2021-02-14 ENCOUNTER — Other Ambulatory Visit: Payer: Self-pay

## 2021-02-14 VITALS — Wt 194.4 lb

## 2021-02-14 DIAGNOSIS — Z483 Aftercare following surgery for neoplasm: Secondary | ICD-10-CM | POA: Insufficient documentation

## 2021-02-14 NOTE — Therapy (Signed)
[facility] @ [address] [place], [place], [address] Phone: [phone]   Fax:  [phone]  Physical Therapy Treatment  Patient Details  Name: Elizabeth Maldonado MRN: [id number] Date of Birth: November 23, 1945 Referring Provider (PT): Ninfa Linden   Encounter Date: 02/14/2021   PT End of Session - 02/14/21 1522     Visit Number 7   # unchanged due to screen only   PT Start Time 1519    PT Stop Time 1525    PT Time Calculation (min) 6 min    Activity Tolerance Patient tolerated treatment well    Behavior During Therapy [facility] for tasks assessed/performed             Past Medical History:  Diagnosis Date   Breast cancer ([place]) 02/12/2020   Diverticulosis    Elevated blood pressure reading    165/71 a pre-op appt, reports she has seen her primary care in the last few months and intheir office it was in the 923R systolic, was not started on meds at that time, says her bp has "been creeping up since all this stuff started" , denies acute cardiac symptoms    Family history of breast cancer    Family history of colon cancer    Family history of melanoma    Hearing loss    wears hearing aids   History of anemia as a child    History of hiatal hernia    Hypercholesteremia    diet controlled   Hyperlipidemia 02/16/2016   Hypothyroid    Hypothyroidism 02/16/2016    Past Surgical History:  Procedure Laterality Date   ABDOMINAL HYSTERECTOMY     APPENDECTOMY     BUNIONECTOMY     CATARACT EXTRACTION     CATARACT EXTRACTION, BILATERAL     CESAREAN SECTION     CHOLECYSTECTOMY     DILATION AND CURETTAGE OF UTERUS     EYE SURGERY     FOOT SURGERY Right 2011   bone graft    HYSTEROSCOPY WITH D & C N/A 06/11/2018   Procedure: DILATATION AND CURETTAGE /HYSTEROSCOPY WITH MYOSURE POLYPECTOMY;  Surgeon: Lavonia Drafts, MD;  Location: [facility];  Service: Gynecology;  Laterality: N/A;   MASS EXCISION Left 01/10/2021   Procedure: Excision of  left excess mastectomy skin;  Surgeon: Cindra Presume, MD;  Location: [facility];  Service: Plastics;  Laterality: Left;   MASTECTOMY W/ SENTINEL NODE BIOPSY Left 04/19/2020   Procedure: LEFT MASTECTOMY WITH SENTINEL LYMPH NODE BIOPSY;  Surgeon: Coralie Keens, MD;  Location: [facility];  Service: General;  Laterality: Left;   NASAL SEPTUM SURGERY     at age 23   ROBOTIC [place] Bilateral 07/17/2018   Procedure: XI ROBOTIC ASSISTED TOTAL HYSTERECTOMY WITH BILATERAL SALPINGO OOPHORECTOMY AND LEFT PELVIC LYMPADECTOMY;  Surgeon: Everitt Amber, MD;  Location: [facility];  Service: Gynecology;  Laterality: Bilateral;   SENTINEL NODE BIOPSY N/A 07/17/2018   Procedure: SENTINEL NODE BIOPSY;  Surgeon: Everitt Amber, MD;  Location: [facility];  Service: Gynecology;  Laterality: N/A;   TONSILLECTOMY     torn meniscus repair Right     Vitals:   02/14/21 1521  Weight: 194 lb 6 oz (88.2 kg)     Subjective Assessment - 02/14/21 1521     Subjective Pt returns for her 3 month L-Dex screen.    Pertinent History L breast cancer, ER+, plan is to undergo a L mastectomy and  SLNB.  04/20/2020" left mastectomy with deep axillay sentinel lymph node dissection 0/4 positive.                    L-DEX FLOWSHEETS - 02/14/21 1500       L-DEX LYMPHEDEMA SCREENING   Measurement Type Unilateral    L-DEX MEASUREMENT EXTREMITY Upper Extremity    POSITION  Standing    DOMINANT SIDE Left    At Risk Side Left    BASELINE SCORE (UNILATERAL) -3.9    L-DEX SCORE (UNILATERAL) -3.9    VALUE CHANGE (UNILAT) 0                                  PT Short Term Goals - 07/27/20 1723       PT SHORT TERM GOAL #1   Title pt will be independent in a basic exericse program for shoulder ROM and strength    Status Achieved      PT SHORT TERM GOAL #2   Title Pt will report the pain in her left upper quadrant is decreased to 2/10     Status Achieved               PT Long Term Goals - 07/27/20 1723       PT LONG TERM GOAL #1   Title Pt will return to baseline shoulder ROM and not demonstrate any signs or symptoms of lymphedema.    Baseline 06/15/2020 Pt with lymphedema in her anterior and lateral chest, 07/27/2020 hematoma continues to heal, but has some congestion    Status Partially Met      PT LONG TERM GOAL #2   Title Pt will increase the number of sit to stand in 30 sec by 3 from initial measurment.    Baseline pt reports she is doing well    Status Deferred      PT LONG TERM GOAL #3   Title Pt will decrease TUG score by 2 seconds from initial measurement    Baseline pt states she is doing well    Status Deferred                   Plan - 02/14/21 1527     Clinical Impression Statement Pt returns for her 3 month L-Dex screen. Her change from baseline of 0 is WNLs so no further treatment is required at this time. Pt reports does not wish to cont screens at this time and knows she can call us if anything changes.    PT Next Visit Plan Cont every 3 month L-Dex screens from her SLNB (~04/21/22); pt wishes to be placed on hold from screens and knows she can call us if she needs [place]    Consulted and Agree with Plan of Care Patient             Patient will benefit from skilled therapeutic intervention in order to improve the following deficits and impairments:     Visit Diagnosis: Aftercare following surgery for neoplasm     Problem List Patient Active Problem List   Diagnosis Date Noted   History of left breast cancer 04/19/2020   Genetic testing 03/29/2020   Family history of breast cancer    Family history of melanoma    Family history of colon cancer    Malignant neoplasm of upper-outer quadrant of left breast in female, estrogen receptor positive ([place]) 03/09/2020  Family history of coronary artery disease 03/06/2019   Endometrial cancer ([place]) 07/11/2018   Obesity (BMI 35.0-39.9  without comorbidity) 07/11/2018   Endometrial polyp 06/11/2018   Post-menopausal bleeding 06/06/2018   Hyperlipidemia 02/16/2016   Hypothyroidism 02/16/2016   Plantar fasciitis of right foot 12/16/2015   Metatarsal deformity, right 12/16/2015   Pronation deformity of ankle, acquired 12/16/2015    Otelia Limes, PTA 02/14/2021, 3:32 PM  [facility] @ [address] [place], [place], [address] Phone: [phone]   Fax:  [phone]  Name: Elizabeth Maldonado MRN: [id number] Date of Birth: Sep 12, 1945

## 2021-04-01 ENCOUNTER — Other Ambulatory Visit (HOSPITAL_BASED_OUTPATIENT_CLINIC_OR_DEPARTMENT_OTHER): Payer: Self-pay

## 2021-04-01 MED ORDER — LEVOTHYROXINE SODIUM 75 MCG PO TABS
ORAL_TABLET | ORAL | 1 refills | Status: DC
  Filled 2021-06-27: qty 90, 90d supply, fill #0

## 2021-04-01 MED ORDER — REPATHA SURECLICK 140 MG/ML ~~LOC~~ SOAJ
SUBCUTANEOUS | 11 refills | Status: DC
  Filled 2021-04-01: qty 2, 28d supply, fill #0
  Filled 2021-05-02: qty 2, 28d supply, fill #1
  Filled 2021-06-01: qty 2, 28d supply, fill #2
  Filled 2021-06-27: qty 2, 28d supply, fill #3
  Filled 2021-07-23: qty 2, 28d supply, fill #4
  Filled 2021-09-02: qty 2, 28d supply, fill #5
  Filled 2021-10-04: qty 2, 28d supply, fill #6
  Filled 2021-10-28: qty 2, 28d supply, fill #7
  Filled 2021-12-04: qty 2, 28d supply, fill #8
  Filled 2022-01-03: qty 2, 28d supply, fill #9

## 2021-04-05 DIAGNOSIS — H52203 Unspecified astigmatism, bilateral: Secondary | ICD-10-CM | POA: Diagnosis not present

## 2021-04-05 DIAGNOSIS — H524 Presbyopia: Secondary | ICD-10-CM | POA: Diagnosis not present

## 2021-04-05 DIAGNOSIS — H26492 Other secondary cataract, left eye: Secondary | ICD-10-CM | POA: Diagnosis not present

## 2021-04-05 DIAGNOSIS — H43813 Vitreous degeneration, bilateral: Secondary | ICD-10-CM | POA: Diagnosis not present

## 2021-05-02 ENCOUNTER — Other Ambulatory Visit (HOSPITAL_BASED_OUTPATIENT_CLINIC_OR_DEPARTMENT_OTHER): Payer: Self-pay

## 2021-05-16 ENCOUNTER — Other Ambulatory Visit (HOSPITAL_BASED_OUTPATIENT_CLINIC_OR_DEPARTMENT_OTHER): Payer: Self-pay

## 2021-05-16 MED ORDER — GABAPENTIN 300 MG PO CAPS
ORAL_CAPSULE | ORAL | 4 refills | Status: AC
  Filled 2021-05-16: qty 90, 90d supply, fill #0

## 2021-06-01 ENCOUNTER — Other Ambulatory Visit (HOSPITAL_BASED_OUTPATIENT_CLINIC_OR_DEPARTMENT_OTHER): Payer: Self-pay

## 2021-06-27 ENCOUNTER — Other Ambulatory Visit (HOSPITAL_BASED_OUTPATIENT_CLINIC_OR_DEPARTMENT_OTHER): Payer: Self-pay

## 2021-06-28 ENCOUNTER — Other Ambulatory Visit: Payer: Self-pay | Admitting: Family Medicine

## 2021-06-28 DIAGNOSIS — Z1231 Encounter for screening mammogram for malignant neoplasm of breast: Secondary | ICD-10-CM

## 2021-07-20 ENCOUNTER — Emergency Department (HOSPITAL_BASED_OUTPATIENT_CLINIC_OR_DEPARTMENT_OTHER)
Admission: EM | Admit: 2021-07-20 | Discharge: 2021-07-20 | Disposition: A | Discharge status: Home / Self Care | Payer: Medicare PPO | Attending: Emergency Medicine | Admitting: Emergency Medicine

## 2021-07-20 ENCOUNTER — Other Ambulatory Visit: Payer: Self-pay

## 2021-07-20 ENCOUNTER — Emergency Department (HOSPITAL_BASED_OUTPATIENT_CLINIC_OR_DEPARTMENT_OTHER): Payer: Medicare PPO

## 2021-07-20 ENCOUNTER — Encounter (HOSPITAL_BASED_OUTPATIENT_CLINIC_OR_DEPARTMENT_OTHER): Payer: Self-pay | Admitting: Emergency Medicine

## 2021-07-20 DIAGNOSIS — R519 Headache, unspecified: Secondary | ICD-10-CM | POA: Diagnosis not present

## 2021-07-20 DIAGNOSIS — I10 Essential (primary) hypertension: Secondary | ICD-10-CM | POA: Insufficient documentation

## 2021-07-20 DIAGNOSIS — Z79899 Other long term (current) drug therapy: Secondary | ICD-10-CM | POA: Insufficient documentation

## 2021-07-20 MED ORDER — AMLODIPINE BESYLATE 5 MG PO TABS
5.0000 mg | ORAL_TABLET | Freq: Every day | ORAL | 0 refills | Status: DC
  Filled 2021-07-20: qty 30, 30d supply, fill #0

## 2021-07-20 MED ORDER — AMLODIPINE BESYLATE 5 MG PO TABS: 5.0000 mg | ORAL_TABLET | Freq: Every day | ORAL | 0 refills | Status: DC

## 2021-07-20 MED ORDER — AMLODIPINE BESYLATE 5 MG PO TABS
5.0000 mg | ORAL_TABLET | Freq: Once | ORAL | Status: AC
  Administered 2021-07-20: 5 mg via ORAL
  Filled 2021-07-20: qty 1

## 2021-07-20 NOTE — ED Provider Notes (Signed)
[facility] EMERGENCY DEPARTMENT Provider Note   CSN: [id number] Arrival date & time: 07/20/21  2210     History  Chief Complaint  Patient presents with   Hypertension    Elizabeth Maldonado is a 76 y.o. female.  Is here with high blood pressure.  Blood pressure 180/90 at home for the last 2 days.  Denies any chest pain, shortness of breath, weakness, numbness, dizziness.  She has a very mild headache but similar to her prior headaches. denies any stroke symptoms including weakness, numbness, speech changes, vision changes.  No specific history of hypertension.  Nothing makes it worse or better.  She is overall asymptomatic.    The history is provided by the patient.       Home Medications Prior to Admission medications   Medication Sig Start Date End Date Taking? Authorizing Provider  amLODipine (NORVASC) 5 MG tablet Take 1 tablet (5 mg total) by mouth daily. 07/20/21 08/19/21 Yes Roise Emert, DO  b complex vitamins tablet Take 1 tablet by mouth daily with supper.    [provider]  Calcium Carb-Cholecalciferol 500-400 MG-UNIT TABS Take 1 tablet by mouth daily with supper.    [provider]  Calcium Citrate-Vitamin D (CALCIUM + D PO) Take 1 tablet by mouth daily.    [provider]  Cholecalciferol (VITAMIN D) 125 MCG (5000 UT) CAPS Take 5,000 Units by mouth daily with supper.    [provider]  Coenzyme Q10 (COQ10) 200 MG CAPS Take 200 mg by mouth daily with supper.    [provider]  Evolocumab (REPATHA SURECLICK) 027 MG/ML SOAJ Administer 1 injection under the skin every 2 week as directed 02/07/21     gabapentin (NEURONTIN) 300 MG capsule Take 1 capsule by mouth every night at bedtime 05/26/20     ibuprofen (ADVIL) 400 MG tablet Take 400 mg by mouth every 6 (six) hours as needed.    [provider]  levothyroxine (SYNTHROID) 75 MCG tablet Take 1 tablet by mouth daily 03/29/21     levothyroxine (SYNTHROID, LEVOTHROID)  75 MCG tablet Take 75 mcg by mouth daily before breakfast.    [provider]  Multiple Vitamin (MULTIVITAMIN WITH MINERALS) TABS tablet Take 1 tablet by mouth daily with supper.    [provider]  REPATHA SURECLICK 741 MG/ML SOAJ Inject 140 mg into the skin every 14 (fourteen) days. 02/14/19   [provider]  tamoxifen (NOLVADEX) 20 MG tablet Take 1 tablet (20 mg total) by mouth daily. Start Feb 02, 2021 12/20/20   Magrinat, Virgie Dad, MD  Tetrahydrozoline HCl (VISINE OP) Place 1 drop into both eyes daily as needed (irritation/itchy eyes).    [provider]  Turmeric 500 MG CAPS Take by mouth.    [provider]      Allergies    Crestor [rosuvastatin], Lipitor [atorvastatin], Colestipol hcl, Acetaminophen, and Vytorin [ezetimibe-simvastatin]    Review of Systems   Review of Systems  Physical Exam Updated Vital Signs BP (!) 172/59   Pulse (!) 58   Temp 97.7 F (36.5 C) (Oral)   Resp 16   Ht 5' 2.5" (1.588 m)   Wt 86.2 kg   LMP  (LMP Unknown)   SpO2 100%   BMI 34.20 kg/m  Physical Exam Vitals and nursing note reviewed.  Constitutional:      General: She is not in acute distress.    Appearance: She is well-developed. She is not ill-appearing.  HENT:  Head: Normocephalic and atraumatic.     Nose: Nose normal.     Mouth/Throat:     Mouth: Mucous membranes are moist.  Eyes:     Extraocular Movements: Extraocular movements intact.     Conjunctiva/sclera: Conjunctivae normal.     Pupils: Pupils are equal, round, and reactive to light.  Cardiovascular:     Rate and Rhythm: Normal rate and regular rhythm.     Heart sounds: No murmur heard. Pulmonary:     Effort: Pulmonary effort is normal. No respiratory distress.     Breath sounds: Normal breath sounds.  Abdominal:     Palpations: Abdomen is soft.     Tenderness: There is no abdominal tenderness.  Musculoskeletal:        General: No swelling.     Cervical back: Normal range  of motion.  Skin:    General: Skin is warm and dry.     Capillary Refill: Capillary refill takes less than 2 seconds.  Neurological:     General: No focal deficit present.     Mental Status: She is alert and oriented to person, place, and time.     Cranial Nerves: No cranial nerve deficit.     Sensory: No sensory deficit.     Motor: No weakness.     Coordination: Coordination normal.     Comments: 5+ out of 5 strength throughout, normal sensation, normal gait, normal finger-nose-finger, normal speech  Psychiatric:        Mood and Affect: Mood normal.     ED Results / Procedures / Treatments   Labs (all labs ordered are listed, but only abnormal results are displayed) Labs Reviewed - No data to display  EKG None  Radiology No results found.  Procedures Procedures    Medications Ordered in ED Medications  amLODipine (NORVASC) tablet 5 mg (has no administration in time range)    ED Course/ Medical Decision Making/ A&P                           Medical Decision Making Amount and/or Complexity of Data Reviewed Radiology: ordered.   Mollie Rossano [facility] is here with concern for high blood pressure.  Blood pressure 172/59.  Otherwise normal vitals.  She has had a mild headache but otherwise has felt well.  Denies any chest pain or shortness of breath.  No weakness or numbness.  She is very well-appearing.  Normal neurological exam.  Shared decision was made to get a head CT to rule out head bleed.  I have no concern for stroke.  Overall she is fairly asymptomatic from a high blood pressure standpoint.  I have no concern for ACS or other acute cardiac or pulmonary process.  CT scan of the head is unremarkable.  I will start the patient on amlodipine.  Given the first dose here in the ED.  Recommend follow-up with primary care doctor.  This chart was dictated using voice recognition software.  Despite best efforts to proofread,  errors can occur which can change the documentation  meaning.         Final Clinical Impression(s) / ED Diagnoses Final diagnoses:  Hypertension, unspecified type    Rx / DC Orders ED Discharge Orders          Ordered    amLODipine (NORVASC) 5 MG tablet  Daily        07/20/21 2226  Lennice Sites, DO 07/20/21 2228

## 2021-07-20 NOTE — ED Triage Notes (Signed)
Pt reports elevated BP readings at home x 2 days. No hx of hypertension. BP at home 180s/90s

## 2021-07-20 NOTE — ED Notes (Signed)
Patient transported to CT 

## 2021-07-20 NOTE — Discharge Instructions (Addendum)
Head CT today is unremarkable.  Take your next dose of amlodipine tomorrow evening.  Please record your blood pressures twice a day and as long as you are not having severe crushing chest pain, stroke symptoms or other concerning symptoms please follow-up with your primary care doctor.  It may take several days until you see a more consistent improvement of your blood pressure.

## 2021-07-21 ENCOUNTER — Other Ambulatory Visit (HOSPITAL_BASED_OUTPATIENT_CLINIC_OR_DEPARTMENT_OTHER): Payer: Self-pay

## 2021-07-25 ENCOUNTER — Other Ambulatory Visit (HOSPITAL_BASED_OUTPATIENT_CLINIC_OR_DEPARTMENT_OTHER): Payer: Self-pay

## 2021-08-02 ENCOUNTER — Other Ambulatory Visit (HOSPITAL_BASED_OUTPATIENT_CLINIC_OR_DEPARTMENT_OTHER): Payer: Self-pay

## 2021-08-02 DIAGNOSIS — E669 Obesity, unspecified: Secondary | ICD-10-CM | POA: Diagnosis not present

## 2021-08-02 DIAGNOSIS — E039 Hypothyroidism, unspecified: Secondary | ICD-10-CM | POA: Diagnosis not present

## 2021-08-02 DIAGNOSIS — I700 Atherosclerosis of aorta: Secondary | ICD-10-CM | POA: Diagnosis not present

## 2021-08-02 DIAGNOSIS — I10 Essential (primary) hypertension: Secondary | ICD-10-CM | POA: Diagnosis not present

## 2021-08-02 DIAGNOSIS — R7303 Prediabetes: Secondary | ICD-10-CM | POA: Diagnosis not present

## 2021-08-02 DIAGNOSIS — E785 Hyperlipidemia, unspecified: Secondary | ICD-10-CM | POA: Diagnosis not present

## 2021-08-02 DIAGNOSIS — M7062 Trochanteric bursitis, left hip: Secondary | ICD-10-CM | POA: Diagnosis not present

## 2021-08-02 MED ORDER — AMLODIPINE BESYLATE 5 MG PO TABS
5.0000 mg | ORAL_TABLET | Freq: Every day | ORAL | 1 refills | Status: AC
  Filled 2021-08-18: qty 90, 90d supply, fill #0

## 2021-08-02 MED ORDER — LEVOTHYROXINE SODIUM 75 MCG PO TABS
75.0000 ug | ORAL_TABLET | Freq: Every day | ORAL | 1 refills | Status: DC
  Filled 2021-09-26: qty 90, 90d supply, fill #0
  Filled 2021-12-20: qty 90, 90d supply, fill #1

## 2021-08-15 DIAGNOSIS — M25552 Pain in left hip: Secondary | ICD-10-CM | POA: Diagnosis not present

## 2021-08-18 ENCOUNTER — Other Ambulatory Visit (HOSPITAL_BASED_OUTPATIENT_CLINIC_OR_DEPARTMENT_OTHER): Payer: Self-pay

## 2021-09-02 ENCOUNTER — Other Ambulatory Visit (HOSPITAL_BASED_OUTPATIENT_CLINIC_OR_DEPARTMENT_OTHER): Payer: Self-pay

## 2021-09-26 ENCOUNTER — Other Ambulatory Visit (HOSPITAL_BASED_OUTPATIENT_CLINIC_OR_DEPARTMENT_OTHER): Payer: Self-pay

## 2021-10-04 ENCOUNTER — Other Ambulatory Visit (HOSPITAL_BASED_OUTPATIENT_CLINIC_OR_DEPARTMENT_OTHER): Payer: Self-pay

## 2021-10-05 ENCOUNTER — Telehealth: Payer: Self-pay | Admitting: *Deleted

## 2021-10-05 NOTE — Telephone Encounter (Signed)
Called and left the patient a message to call the office back. Need to move the appt to either in the morning on 10/11 or move to 11/8

## 2021-10-06 ENCOUNTER — Ambulatory Visit
Admission: RE | Admit: 2021-10-06 | Discharge: 2021-10-06 | Disposition: A | Discharge status: Home / Self Care | Payer: Medicare PPO | Source: Ambulatory Visit | Attending: Family Medicine | Admitting: Family Medicine

## 2021-10-06 DIAGNOSIS — Z1231 Encounter for screening mammogram for malignant neoplasm of breast: Secondary | ICD-10-CM | POA: Diagnosis not present

## 2021-10-06 DIAGNOSIS — E65 Localized adiposity: Secondary | ICD-10-CM | POA: Diagnosis not present

## 2021-10-06 DIAGNOSIS — Z1331 Encounter for screening for depression: Secondary | ICD-10-CM | POA: Diagnosis not present

## 2021-10-06 DIAGNOSIS — E8889 Other specified metabolic disorders: Secondary | ICD-10-CM | POA: Diagnosis not present

## 2021-10-06 DIAGNOSIS — E559 Vitamin D deficiency, unspecified: Secondary | ICD-10-CM | POA: Diagnosis not present

## 2021-10-06 DIAGNOSIS — Z6835 Body mass index (BMI) 35.0-35.9, adult: Secondary | ICD-10-CM | POA: Diagnosis not present

## 2021-10-06 DIAGNOSIS — E785 Hyperlipidemia, unspecified: Secondary | ICD-10-CM | POA: Diagnosis not present

## 2021-10-06 HISTORY — DX: Encounter for nonprocreative screening for genetic disease carrier status: Z13.71

## 2021-10-06 NOTE — Telephone Encounter (Signed)
Spoke with patient and had appointment rescheduled on 10/11 at 1:30 moved to 9:00am.  Patient confirmed and understood.

## 2021-10-12 ENCOUNTER — Other Ambulatory Visit: Payer: Self-pay

## 2021-10-12 ENCOUNTER — Ambulatory Visit: Payer: Medicare PPO | Admitting: Obstetrics & Gynecology

## 2021-10-12 ENCOUNTER — Encounter: Payer: Self-pay | Admitting: Obstetrics & Gynecology

## 2021-10-12 ENCOUNTER — Inpatient Hospital Stay: Payer: Medicare PPO | Attending: Obstetrics & Gynecology | Admitting: Obstetrics & Gynecology

## 2021-10-12 VITALS — BP 135/57 | HR 72 | Temp 98.1°F | Resp 18 | Ht 61.81 in | Wt 198.0 lb

## 2021-10-12 DIAGNOSIS — C541 Malignant neoplasm of endometrium: Secondary | ICD-10-CM

## 2021-10-12 DIAGNOSIS — Z8542 Personal history of malignant neoplasm of other parts of uterus: Secondary | ICD-10-CM | POA: Insufficient documentation

## 2021-10-12 NOTE — Patient Instructions (Signed)
Return in 1 yr 

## 2021-10-12 NOTE — Assessment & Plan Note (Signed)
Ms. BOSTYN KUNKLER  is a 76 y.o.  year old with a history of stage IA grade 1 endometrioid endometrial cancer s/p robotic staging procedure on 07/16/18. Negative symptom review, normal exam.  No evidence of recurrence   Continue follow-up every 6 months for 5 years, however the pt requests annual f/u

## 2021-10-12 NOTE — Progress Notes (Signed)
Follow Up Note: Gyn-Onc  Elizabeth Maldonado 76 y.o. female  CC: She presents for a f/u visit   HPI: The oncology history was reviewed.  Interval History: She denies any vaginal bleeding, abdominal/pelvic pain, cough, lethargy or abdominal distention.     Review of Systems  Review of Systems  Constitutional:  Negative for malaise/fatigue and weight loss.  Respiratory:  Negative for shortness of breath and wheezing.   Cardiovascular:  Negative for chest pain and leg swelling.  Gastrointestinal:  Negative for abdominal pain, blood in stool, constipation, nausea and vomiting.  Genitourinary:  Negative for dysuria, frequency, hematuria and urgency.  Musculoskeletal:  Negative for joint pain and myalgias.  Neurological:  Negative for weakness.  Psychiatric/Behavioral:  Negative for depression. The patient does not have insomnia.    Current medications, allergy, social history, past surgical history, past medical history, family history were all reviewed.    Vitals:  BP (!) 135/57 (BP Location: Right Arm, Patient Position: Sitting) Comment: informed Dr Delsa Sale of Bp  Pulse 72   Temp 98.1 F (36.7 C) (Oral)   Resp 18   Ht 5' 1.81" (1.57 m)   Wt 198 lb (89.8 kg)   LMP  (LMP Unknown)   SpO2 97%   BMI 36.44 kg/m    Physical Exam:  Physical Exam Exam conducted with a chaperone present.  Constitutional:      General: She is not in acute distress. Cardiovascular:     Rate and Rhythm: Normal rate and regular rhythm.  Pulmonary:     Effort: Pulmonary effort is normal.     Breath sounds: Normal breath sounds. No wheezing or rhonchi.  Abdominal:     Palpations: Abdomen is soft.     Tenderness: There is no abdominal tenderness. There is no right CVA tenderness or left CVA tenderness.     Hernia: No hernia is present.  Genitourinary:    General: Normal vulva.     Urethra: No urethral lesion.     Vagina: No lesions. No bleeding Musculoskeletal:     Cervical back: Neck supple.      Right lower leg: No edema.     Left lower leg: No edema.  Lymphadenopathy:     Upper Body:     Right upper body: No supraclavicular adenopathy.     Left upper body: No supraclavicular adenopathy.     Lower Body: No right inguinal adenopathy. No left inguinal adenopathy.  Skin:    Findings: No rash.  Neurological:     Mental Status: She is oriented to person, place, and time.   Assessment/Plan:   Endometrial cancer [facility]) [name]  is a 76 y.o.  year old with a history of stage IA grade 1 endometrioid endometrial cancer s/p robotic staging procedure on 07/16/18. Negative symptom review, normal exam.  No evidence of recurrence      Continue follow-up every 6 months for 5 years, however the pt requests annual f/u   I personally spent 25 minutes face-to-face and non-face-to-face in the care of this patient, which includes all pre, intra, and post visit time on the date of service.    Lahoma Crocker, MD

## 2021-10-24 DIAGNOSIS — E785 Hyperlipidemia, unspecified: Secondary | ICD-10-CM | POA: Diagnosis not present

## 2021-10-24 DIAGNOSIS — E65 Localized adiposity: Secondary | ICD-10-CM | POA: Diagnosis not present

## 2021-10-24 DIAGNOSIS — E559 Vitamin D deficiency, unspecified: Secondary | ICD-10-CM | POA: Diagnosis not present

## 2021-10-28 ENCOUNTER — Other Ambulatory Visit (HOSPITAL_BASED_OUTPATIENT_CLINIC_OR_DEPARTMENT_OTHER): Payer: Self-pay

## 2021-11-29 ENCOUNTER — Other Ambulatory Visit (HOSPITAL_BASED_OUTPATIENT_CLINIC_OR_DEPARTMENT_OTHER): Payer: Self-pay

## 2021-11-29 DIAGNOSIS — S29019A Strain of muscle and tendon of unspecified wall of thorax, initial encounter: Secondary | ICD-10-CM | POA: Diagnosis not present

## 2021-11-29 DIAGNOSIS — M546 Pain in thoracic spine: Secondary | ICD-10-CM | POA: Diagnosis not present

## 2021-11-29 MED ORDER — MELOXICAM 7.5 MG PO TABS
7.5000 mg | ORAL_TABLET | Freq: Every day | ORAL | 0 refills | Status: DC
Start: 2021-11-29 — End: 2023-11-25
  Filled 2021-11-29: qty 30, 30d supply, fill #0

## 2021-11-29 MED ORDER — METHOCARBAMOL 750 MG PO TABS
ORAL_TABLET | ORAL | 0 refills | Status: AC
  Filled 2021-11-29: qty 40, 10d supply, fill #0

## 2021-12-05 ENCOUNTER — Other Ambulatory Visit (HOSPITAL_BASED_OUTPATIENT_CLINIC_OR_DEPARTMENT_OTHER): Payer: Self-pay

## 2022-02-14 DIAGNOSIS — Z0000 Encounter for general adult medical examination without abnormal findings: Secondary | ICD-10-CM | POA: Diagnosis not present

## 2022-02-14 DIAGNOSIS — Z853 Personal history of malignant neoplasm of breast: Secondary | ICD-10-CM | POA: Diagnosis not present

## 2022-02-14 DIAGNOSIS — I700 Atherosclerosis of aorta: Secondary | ICD-10-CM | POA: Diagnosis not present

## 2022-02-14 DIAGNOSIS — I10 Essential (primary) hypertension: Secondary | ICD-10-CM | POA: Diagnosis not present

## 2022-02-14 DIAGNOSIS — E039 Hypothyroidism, unspecified: Secondary | ICD-10-CM | POA: Diagnosis not present

## 2022-02-14 DIAGNOSIS — R7309 Other abnormal glucose: Secondary | ICD-10-CM | POA: Diagnosis not present

## 2022-02-14 DIAGNOSIS — R7303 Prediabetes: Secondary | ICD-10-CM | POA: Diagnosis not present

## 2022-02-14 DIAGNOSIS — Z1331 Encounter for screening for depression: Secondary | ICD-10-CM | POA: Diagnosis not present

## 2022-02-14 DIAGNOSIS — E785 Hyperlipidemia, unspecified: Secondary | ICD-10-CM | POA: Diagnosis not present

## 2022-02-14 DIAGNOSIS — E559 Vitamin D deficiency, unspecified: Secondary | ICD-10-CM | POA: Diagnosis not present

## 2022-02-20 ENCOUNTER — Other Ambulatory Visit (HOSPITAL_BASED_OUTPATIENT_CLINIC_OR_DEPARTMENT_OTHER): Payer: Self-pay

## 2022-02-20 ENCOUNTER — Telehealth: Payer: Self-pay | Admitting: Hematology and Oncology

## 2022-02-20 MED ORDER — REPATHA SURECLICK 140 MG/ML ~~LOC~~ SOAJ
140.0000 mg | SUBCUTANEOUS | 11 refills | Status: AC
  Filled 2022-02-20: qty 2, 28d supply, fill #0
  Filled 2022-03-17: qty 2, 28d supply, fill #1
  Filled 2022-06-06: qty 2, 28d supply, fill #2
  Filled 2022-09-07: qty 2, 28d supply, fill #3
  Filled 2022-11-03: qty 2, 28d supply, fill #4

## 2022-02-20 NOTE — Telephone Encounter (Signed)
Scheduled appt per 2/19 referral. Pt is aware of appt date and time. Pt is aware to arrive 15 mins prior to appt time and to bring and updated insurance card. Pt is aware of appt location.

## 2022-03-06 ENCOUNTER — Encounter: Payer: Self-pay | Admitting: Hematology and Oncology

## 2022-03-06 ENCOUNTER — Other Ambulatory Visit: Payer: Self-pay

## 2022-03-06 ENCOUNTER — Inpatient Hospital Stay: Payer: Medicare PPO | Attending: Hematology and Oncology | Admitting: Hematology and Oncology

## 2022-03-06 ENCOUNTER — Inpatient Hospital Stay: Payer: Medicare PPO

## 2022-03-06 VITALS — BP 144/60 | HR 62 | Temp 98.1°F | Resp 16 | Ht 61.0 in | Wt 192.5 lb

## 2022-03-06 DIAGNOSIS — Z8542 Personal history of malignant neoplasm of other parts of uterus: Secondary | ICD-10-CM | POA: Diagnosis not present

## 2022-03-06 DIAGNOSIS — Z801 Family history of malignant neoplasm of trachea, bronchus and lung: Secondary | ICD-10-CM | POA: Insufficient documentation

## 2022-03-06 DIAGNOSIS — Z800 Family history of malignant neoplasm of digestive organs: Secondary | ICD-10-CM | POA: Insufficient documentation

## 2022-03-06 DIAGNOSIS — Z9012 Acquired absence of left breast and nipple: Secondary | ICD-10-CM | POA: Diagnosis not present

## 2022-03-06 DIAGNOSIS — Z90710 Acquired absence of both cervix and uterus: Secondary | ICD-10-CM | POA: Insufficient documentation

## 2022-03-06 DIAGNOSIS — Z853 Personal history of malignant neoplasm of breast: Secondary | ICD-10-CM | POA: Diagnosis not present

## 2022-03-06 DIAGNOSIS — Z806 Family history of leukemia: Secondary | ICD-10-CM | POA: Diagnosis not present

## 2022-03-06 DIAGNOSIS — Z8042 Family history of malignant neoplasm of prostate: Secondary | ICD-10-CM | POA: Diagnosis not present

## 2022-03-06 DIAGNOSIS — C50412 Malignant neoplasm of upper-outer quadrant of left female breast: Secondary | ICD-10-CM

## 2022-03-06 DIAGNOSIS — Z803 Family history of malignant neoplasm of breast: Secondary | ICD-10-CM | POA: Insufficient documentation

## 2022-03-06 DIAGNOSIS — Z170 Estrogen receptor positive status [ER+]: Secondary | ICD-10-CM

## 2022-03-06 DIAGNOSIS — Z807 Family history of other malignant neoplasms of lymphoid, hematopoietic and related tissues: Secondary | ICD-10-CM | POA: Insufficient documentation

## 2022-03-06 DIAGNOSIS — Z8049 Family history of malignant neoplasm of other genital organs: Secondary | ICD-10-CM | POA: Diagnosis not present

## 2022-03-06 NOTE — Progress Notes (Signed)
Elizabeth Maldonado  Telephone:([phone]) [phone] [phone]) [phone]     ID: Elizabeth Maldonado DOB: [birth year]  MR#: [id number]  [id number]  Patient Care Team: Elizabeth Ada, MD as PCP - General (Family Medicine) Elizabeth Latch, MD as PCP - Cardiology (Cardiology) Maldonado, Elizabeth Dad, MD (Inactive) as Consulting Physician (Oncology) Elizabeth Keens, MD as Consulting Physician (General Surgery) Elizabeth Rudd, MD as Consulting Physician (Radiation Oncology) Elizabeth Corner, MD as Consulting Physician (Gastroenterology) Elizabeth Kaufmann, RN as Oncology Nurse Navigator Elizabeth Germany, RN as Oncology Nurse Navigator Elizabeth Pike, MD OTHER MD:  CHIEF COMPLAINT: Estrogen receptor positive breast cancer (s/p left mastectomy)  CURRENT TREATMENT: observation  INTERVAL HISTORY:  Elizabeth Maldonado returns today for follow up of her estrogen receptor positive breast cancer.  Since her last visit here with Dr. Jana Maldonado, she is not on any antiestrogen therapy.  She apparently tried anastrozole, exemestane, tamoxifen and did not tolerate it well.  She is currently only on surveillance.  Her last mammogram was in October which was unremarkable.  She denies any changes in the breast except for some intermittent sharp shooting pains in the area of mastectomy.  She otherwise follows up with her PCP regularly and denies any medical issues at all.  Rest of the pertinent 10 point ROS reviewed and negative  REVIEW OF SYSTEMS: [name] walks her dog every day.  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  She is quite busy with her life and she goes to mass every morning.  A detailed review of systems today was otherwise stable   COVID 19 VACCINATION STATUS: [place] x2; infection 10/24/2019, no booster as of March 2022; she did have COVID in 2020   HISTORY OF CURRENT ILLNESS: From the original intake note:  Elizabeth Maldonado had routine screening mammography on 01/06/2020  showing a possible abnormality in the left breast. She underwent left diagnostic mammography with tomography and left breast ultrasonography at [facility] on 01/13/2020 showing: breast density category B; two adjacent masses in left breast at 3 o'clock-- one measuring 1 cm and the other 0.8 cm; small asymmetry 3 cm posterior and medial to mass on mammography, no sonographic correlate; 1.6 cm group of indeterminate calcifications in retroareolar medial left breast; normal-appearing lymph nodes.  Accordingly on 02/12/2020 she proceeded to biopsy of the left breast area in question. The pathology from this procedure [id number]) showed:  1. Left Breast, Retroareolar  - intraductal papilloma with atypia and associated dystrophic calcifications 2. Left Breast, 3 o'clock  - invasive ductal carcinoma, grade 2  - ductal carcinoma in situ, intermediate grade   - Prognostic indicators significant for: estrogen receptor, >95% positive and progesterone receptor, >95% positive, both with strong staining intensity. Proliferation marker Ki67 at 5%. HER2 negative by immunohistochemistry (0).   Cancer Staging  Malignant neoplasm of upper-outer quadrant of left breast in female, estrogen receptor positive ([place]) Staging form: Breast, AJCC 8th Edition - Clinical stage from 03/09/2020: Stage IA (cT1c, cN0, cM0, G2, ER+, PR+, HER2-) - Signed by Elizabeth Cruel, MD on 03/17/2020 Stage prefix: Initial diagnosis Method of lymph node assessment: Clinical Histologic grading system: 3 grade system   The patient's subsequent history is as detailed below.   PAST MEDICAL HISTORY: Past Medical History:  Diagnosis Date   BRCA gene mutation negative in female    Breast cancer ([place]) 02/12/2020   Diverticulosis    Elevated blood pressure reading    165/71 a pre-op appt,  reports she has seen her primary care in the last few months and intheir office it was in the 0000000 systolic, was not started on meds at that time, says her bp  has "been creeping up since all this stuff started" , denies acute cardiac symptoms    Family history of breast cancer    Family history of colon cancer    Family history of melanoma    Hearing loss    wears hearing aids   History of anemia as a child    History of hiatal hernia    Hypercholesteremia    diet controlled   Hyperlipidemia 02/16/2016   Hypothyroid    Hypothyroidism 02/16/2016    PAST SURGICAL HISTORY: Past Surgical History:  Procedure Laterality Date   ABDOMINAL HYSTERECTOMY     APPENDECTOMY     BUNIONECTOMY     CATARACT EXTRACTION     CATARACT EXTRACTION, BILATERAL     CESAREAN SECTION     CHOLECYSTECTOMY     DILATION AND CURETTAGE OF UTERUS     EYE SURGERY     FOOT SURGERY Right 2011   bone graft    HYSTEROSCOPY WITH D & C N/A 06/11/2018   Procedure: DILATATION AND CURETTAGE /HYSTEROSCOPY WITH MYOSURE POLYPECTOMY;  Surgeon: Elizabeth Drafts, MD;  Location: [place];  Service: Gynecology;  Laterality: N/A;   MASS EXCISION Left 01/10/2021   Procedure: Excision of left excess mastectomy skin;  Surgeon: Elizabeth Presume, MD;  Location: [place];  Service: Plastics;  Laterality: Left;   MASTECTOMY W/ SENTINEL NODE BIOPSY Left 04/19/2020   Procedure: LEFT MASTECTOMY WITH SENTINEL LYMPH NODE BIOPSY;  Surgeon: Elizabeth Keens, MD;  Location: [place];  Service: General;  Laterality: Left;   NASAL SEPTUM SURGERY     at age 6   ROBOTIC [place] Bilateral 07/17/2018   Procedure: XI ROBOTIC ASSISTED TOTAL HYSTERECTOMY WITH BILATERAL SALPINGO OOPHORECTOMY AND LEFT PELVIC LYMPADECTOMY;  Surgeon: Elizabeth Amber, MD;  Location: [facility];  Service: Gynecology;  Laterality: Bilateral;   SENTINEL NODE BIOPSY N/A 07/17/2018   Procedure: SENTINEL NODE BIOPSY;  Surgeon: Elizabeth Amber, MD;  Location: [facility];  Service: Gynecology;  Laterality: N/A;   TONSILLECTOMY     torn meniscus repair Right     FAMILY  HISTORY: Family History  Problem Relation Age of Onset   Hypertension Mother    Heart attack Mother    Heart attack Father    Lung cancer Father    Skin cancer Father    Diabetes Sister    Cervical cancer Sister    Skin cancer Sister    Diabetes Brother    CAD Brother    Lymphoma Brother    Skin cancer Brother    Diabetes Brother    Melanoma Brother    Melanoma Brother    Prostate cancer Brother    Diverticulitis Brother    Skin cancer Brother    Skin cancer Sister    Breast cancer Maternal Aunt 35   Colon cancer Paternal Grandmother    Lung cancer Maternal Aunt    Leukemia Cousin    Lung cancer Cousin   The patient's father died at the age of 53 from lung cancer in the setting of tobacco abuse.  His mother had colon cancer and his father may have had some unknown type of cancer.  The patient's father had 1 brother and 1 sister with no cancer and also no children.  The patient's mother  died at the age of 38 from renal failure.  Her parents did not have cancer.  She had 1 sister with breast cancer at the age of 65, 1 sister with lung cancer and a second sister with   Also on the maternal side there were 2 cousins with lung cancer and one with leukemia.   GYNECOLOGIC HISTORY:  No LMP recorded (lmp unknown). Patient has had a hysterectomy. Menarche: 77 years old Age at first live birth: 77 years old [place] P 1 LMP age 76 Contraceptive HRT at least 5 years  Hysterectomy? Yes, 07/2018, for grade 1 endometrial cancer, final pathology showed no residual carcinoma BSO? 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  She lives with her husband Mortimer Fries, currently 51, who is a retired Financial risk analyst working for [employer].  Their daughter Berline Chough, 68, lives in [place] and is a clinical psychiatrist working at the state mental hospital.  The patient has no grandchildren.  She attends [employer] and also a [employer] which is her husband's denomination.  They both go to both churches every Sunday and participated in both communities.   ADVANCED DIRECTIVES: In the absence of any documentation to the contrary, the patient's spouse is their HCPOA.    HEALTH MAINTENANCE: Social History   Tobacco Use   Smoking status: Never   Smokeless tobacco: Never  Vaping Use   Vaping Use: Never used  Substance Use Topics   Alcohol use: Not Currently    Comment: one to two beers a year   Drug use: No     Colonoscopy: April 2014/ [facility]  PAP: 01/2016, negative  Bone density:    Allergies  Allergen Reactions   Crestor [Rosuvastatin] Other (See Comments)    myalgia   Lipitor [Atorvastatin] Other (See Comments)    myalgia   Colestipol Hcl Other (See Comments)   Acetaminophen Itching and Other (See Comments)    "hyperactivity"   Vytorin [Ezetimibe-Simvastatin] Other (See Comments)    myalgia    Current Outpatient Medications  Medication Sig Dispense Refill   amLODipine (NORVASC) 5 MG tablet Take 1 tablet (5 mg total) by mouth daily. 90 tablet 1   b complex vitamins tablet Take 1 tablet by mouth daily with supper.     Calcium Carb-Cholecalciferol 500-400 MG-UNIT TABS Take 1 tablet by mouth daily with supper.     Calcium Citrate-Vitamin D (CALCIUM + D PO) Take 1 tablet by mouth daily.     Cholecalciferol (VITAMIN D) 125 MCG (5000 UT) CAPS Take 5,000 Units by mouth daily with supper.     Coenzyme Q10 (COQ10) 200 MG CAPS Take 200 mg by mouth daily with supper.     Evolocumab (REPATHA SURECLICK) XX123456 MG/ML SOAJ Inject 140 mg into the skin every 14 (fourteen) days. 2 mL 11   gabapentin (NEURONTIN) 300 MG capsule Take 1 capsule by mouth every night at bedtime 90 capsule 4   ibuprofen (ADVIL) 400 MG tablet Take 400 mg by mouth every 6 (six) hours as needed.     levothyroxine (SYNTHROID) 75 MCG tablet Take 1 tablet (75 mcg total) by mouth daily. 90 tablet 1   levothyroxine (SYNTHROID, LEVOTHROID)  75 MCG tablet Take 75 mcg by mouth daily before breakfast.     meloxicam (MOBIC) 7.5 MG tablet Take 1 tablet (7.5 mg total) by mouth daily. 30 tablet 0   methocarbamol (ROBAXIN) 750 MG tablet Take 1 tablet (750  mg total) by mouth in the morning and 1 tablet (750 mg total) at noon and 1 tablet (750 mg total) in the evening and 1 tablet (750 mg total) before bedtime. Do all this for 10 days. 40 tablet 0   Multiple Vitamin (MULTIVITAMIN WITH MINERALS) TABS tablet Take 1 tablet by mouth daily with supper.     REPATHA SURECLICK [id number] MG/ML SOAJ Inject 140 mg into the skin every 14 (fourteen) days.     tamoxifen (NOLVADEX) 20 MG tablet Take 1 tablet (20 mg total) by mouth daily. Start Feb 02, 2021 90 tablet 4   Tetrahydrozoline HCl (VISINE OP) Place 1 drop into both eyes daily as needed (irritation/itchy eyes).     Turmeric 500 MG CAPS Take by mouth.     No current facility-administered medications for this visit.    OBJECTIVE: White woman who appears stated age  34:   03/06/22 1018  BP: (!) 144/60  Pulse: 62  Resp: 16  Temp: 98.1 F (36.7 C)  SpO2: 98%      Body mass index is 36.37 kg/m.   Wt Readings from Last 3 Encounters:  03/06/22 192 lb 8 oz (87.3 kg)  10/12/21 198 lb (89.8 kg)  07/20/21 190 lb (86.2 kg)     ECOG FS:1 - Symptomatic but completely ambulatory  Sclerae unicteric, EOMs intact Wearing a mask No cervical or supraclavicular adenopathy Lungs no rales or rhonchi Heart regular rate and rhythm Abd soft, obese, nontender, positive bowel sounds MSK no focal spinal tenderness, no upper extremity lymphedema Neuro: nonfocal, well oriented, appropriate affect Breasts: Right breast normal to inspection and palpation.  No palpable mass.  Left breast status postmastectomy.  No evidence of local recurrence.  No regional adenopathy.   LAB RESULTS:  CMP     Component Value Date/Time   NA 140 10/26/2020 1422   K 4.5 10/26/2020 1422   CL 105 10/26/2020 1422   CO2 28  10/26/2020 1422   GLUCOSE 102 (H) 10/26/2020 1422   BUN 21 10/26/2020 1422   CREATININE 1.04 (H) 10/26/2020 1422   CALCIUM 9.6 10/26/2020 1422   PROT 7.2 10/26/2020 1422   ALBUMIN 3.8 10/26/2020 1422   AST 15 10/26/2020 1422   ALT 13 10/26/2020 1422   ALKPHOS 70 10/26/2020 1422   BILITOT 1.0 10/26/2020 1422   GFRNONAA 57 (L) 10/26/2020 1422   GFRAA >60 08/30/2018 1214    No results found for: "TOTALPROTELP", "ALBUMINELP", "A1GS", "A2GS", "BETS", "BETA2SER", "GAMS", "MSPIKE", "SPEI"  Lab Results  Component Value Date   WBC 4.7 10/26/2020   NEUTROABS 2.5 10/26/2020   HGB 12.1 10/26/2020   HCT 36.8 10/26/2020   MCV 92.2 10/26/2020   PLT 213 10/26/2020    No results found for: "LABCA2"  No components found for: "[id number]"  No results for input(s): "INR" in the last 168 hours.  No results found for: "LABCA2"  No results found for: "[id number]"  No results found for: "CAN125"  No results found for: "CAN153"  No results found for: "CA2729"  No components found for: "HGQUANT"  No results found for: "CEA1", "CEA" / No results found for: "CEA1", "CEA"   No results found for: "AFPTUMOR"  No results found for: "CHROMOGRNA"  No results found for: "KPAFRELGTCHN", "LAMBDASER", "KAPLAMBRATIO" (kappa/lambda light chains)  No results found for: "HGBA", "HGBA2QUANT", "HGBFQUANT", "HGBSQUAN" (Hemoglobinopathy evaluation)   No results found for: "LDH"  No results found for: "IRON", "TIBC", "IRONPCTSAT" (Iron and TIBC)  No results found for: "FERRITIN"  Urinalysis  Component Value Date/Time   COLORURINE STRAW (A) 08/29/2018 1148   APPEARANCEUR CLEAR 08/29/2018 1148   LABSPEC 1.005 08/29/2018 1148   PHURINE 6.0 08/29/2018 1148   GLUCOSEU NEGATIVE 08/29/2018 1148   HGBUR NEGATIVE 08/29/2018 1148   BILIRUBINUR NEGATIVE 08/29/2018 1148   [place] 08/29/2018 1148   PROTEINUR NEGATIVE 08/29/2018 1148   UROBILINOGEN 0.2 11/19/2011 1851   NITRITE NEGATIVE  08/29/2018 1148   LEUKOCYTESUR NEGATIVE 08/29/2018 1148    STUDIES: No results found.   ELIGIBLE FOR AVAILABLE RESEARCH PROTOCOL: no  ASSESSMENT: 77 y.o. [place] woman status post left breast biopsy 02/12/2020 for a clinical T1b N0, stage IA invasive ductal carcinoma, grade 2, estrogen and progesterone receptor positive, HER-2 nonamplified, with an MIB-1 of 5%.  (1) history of stage IA grade 1 endometrioid endometrial cancer, status post robotic assisted total hysterectomy with bilateral salpingo-oophorectomy, sentinel lymph node biopsy and left pelvic lymphadenectomy 07/17/2018  (a) given good prognosis no adjuvant therapy required for the endometrial cancer  (2) genetics testing 03/26/2020 through the CancerNext-Expanded gene panel offered by [facility] found no deleterious mutations in  AIP, ALK, APC*, ATM*, AXIN2, BAP1, BARD1, BLM, BMPR1A, BRCA1*, BRCA2*, BRIP1*, CDC73, CDH1*, CDK4, CDKN1B, CDKN2A, CHEK2*, CTNNA1, DICER1, FANCC, FH, FLCN, GALNT12, KIF1B, LZTR1, MAX, MEN1, MET, MLH1*, MSH2*, MSH3, MSH6*, MUTYH*, NBN, NF1*, NF2, NTHL1, PALB2*, PHOX2B, PMS2*, POT1, PRKAR1A, PTCH1, PTEN*, RAD51C*, RAD51D*, RB1, RECQL, RET, SDHA, SDHAF2, SDHB, SDHC, SDHD, SMAD4, SMARCA4, SMARCB1, SMARCE1, STK11, SUFU, TMEM127, TP53*, TSC1, TSC2, VHL and XRCC2 (sequencing and deletion/duplication); EGFR, EGLN1, HOXB13, KIT, MITF, PDGFRA, POLD1, and POLE (sequencing only); EPCAM and GREM1 (deletion/duplication only). DNA and RNA analyses performed for * genes. The report date is April 01, 2020. Negative genetic testing on the BRCAplus panel test.  The BRCAPlus gene panel offered by [facility] and includes sequencing and rearrangement analysis for the following 6 genes: BRCA1, BRCA2, CDH1, PALB2, PTEN, and TP53.     (a) PMS2 VUS identified.   (3) status post left mastectomy and sentinel lymph node sampling 04/19/2020 for an mpT1c N0, stage IA invasive ductal carcinoma, grade 1-2, with negative margin  (a) a  total of 3 left axillary lymph nodes removed  (4) anastrozole started 05/26/2020, discontinued October 2022 with arthralgias and myalgias  (5) exemestane started November 2022, could not tolerate it  (6) she was last tried on tamoxifen which once again she could not tolerate.   PLAN:  Patient is surveillance alone.  She has tried different forms of antiestrogen therapy but had severe arthralgias which almost made her bedridden and hence chose to just proceed with surveillance.  She had her last mammogram in October with no concerns for recurrence.  She otherwise feels well, stays active on her 10 acres. She feels comfortable following up with her PCP and returning to hematology oncology as needed.  We have offered her once a year follow-up, she refused.  She was encouraged to do self breast exam and report any changes to [place].  She will keep [place] posted with any new or changes.  Return to clinic as needed per patient's request.  Total time spent: 30 min  *Total Encounter Time as defined by the Centers for Medicare and Medicaid Services includes, in addition to the face-to-face time of a patient visit (documented in the note above) non-face-to-face time: obtaining and reviewing outside history, ordering and reviewing medications, tests or procedures, care coordination (communications with other health care professionals or caregivers) and documentation in the medical record.

## 2022-03-20 ENCOUNTER — Other Ambulatory Visit (HOSPITAL_BASED_OUTPATIENT_CLINIC_OR_DEPARTMENT_OTHER): Payer: Self-pay

## 2022-03-20 MED ORDER — LEVOTHYROXINE SODIUM 75 MCG PO TABS
75.0000 ug | ORAL_TABLET | Freq: Every day | ORAL | 3 refills | Status: AC
  Filled 2022-03-20: qty 90, 90d supply, fill #0
  Filled 2022-06-25: qty 90, 90d supply, fill #1
  Filled 2022-10-06: qty 90, 90d supply, fill #2
  Filled 2023-01-04: qty 90, 90d supply, fill #3

## 2022-04-17 DIAGNOSIS — H26492 Other secondary cataract, left eye: Secondary | ICD-10-CM | POA: Diagnosis not present

## 2022-04-17 DIAGNOSIS — H43813 Vitreous degeneration, bilateral: Secondary | ICD-10-CM | POA: Diagnosis not present

## 2022-04-17 DIAGNOSIS — H04221 Epiphora due to insufficient drainage, right lacrimal gland: Secondary | ICD-10-CM | POA: Diagnosis not present

## 2022-04-17 DIAGNOSIS — H52203 Unspecified astigmatism, bilateral: Secondary | ICD-10-CM | POA: Diagnosis not present

## 2022-04-17 DIAGNOSIS — H524 Presbyopia: Secondary | ICD-10-CM | POA: Diagnosis not present

## 2022-08-24 DIAGNOSIS — E785 Hyperlipidemia, unspecified: Secondary | ICD-10-CM | POA: Diagnosis not present

## 2022-08-24 DIAGNOSIS — I700 Atherosclerosis of aorta: Secondary | ICD-10-CM | POA: Diagnosis not present

## 2022-08-24 DIAGNOSIS — R030 Elevated blood-pressure reading, without diagnosis of hypertension: Secondary | ICD-10-CM | POA: Diagnosis not present

## 2022-08-24 DIAGNOSIS — Z6836 Body mass index (BMI) 36.0-36.9, adult: Secondary | ICD-10-CM | POA: Diagnosis not present

## 2022-08-24 DIAGNOSIS — C50912 Malignant neoplasm of unspecified site of left female breast: Secondary | ICD-10-CM | POA: Diagnosis not present

## 2022-08-24 DIAGNOSIS — Z1211 Encounter for screening for malignant neoplasm of colon: Secondary | ICD-10-CM | POA: Diagnosis not present

## 2022-08-24 DIAGNOSIS — E039 Hypothyroidism, unspecified: Secondary | ICD-10-CM | POA: Diagnosis not present

## 2022-08-24 DIAGNOSIS — E669 Obesity, unspecified: Secondary | ICD-10-CM | POA: Diagnosis not present

## 2022-08-24 DIAGNOSIS — R7303 Prediabetes: Secondary | ICD-10-CM | POA: Diagnosis not present

## 2022-09-06 ENCOUNTER — Other Ambulatory Visit: Payer: Self-pay | Admitting: Family Medicine

## 2022-09-06 DIAGNOSIS — Z1231 Encounter for screening mammogram for malignant neoplasm of breast: Secondary | ICD-10-CM

## 2022-09-06 IMAGING — MR MR BREAST BILAT WO/W CM
5 of 10 series · 22 of 48 positions shown · IV contrast (gadavist)
Comparison: Recent mammography
COMPARISON: Recent mammography

Addendum:
CLINICAL DATA: The patient was called back from screening
mammography January 06, 2020 for a possible mass and a separate group
of calcifications in the left breast. On diagnostic mammography, a
1.6 cm group of coarse and heterogeneous calcifications were
identified and subsequently biopsied demonstrating a papilloma with
atypia. Two adjacent masses were seen in the lateral left breast at
the time of diagnostic mammography and biopsied as 1 lesion
demonstrating invasive ductal carcinoma and associated DCIS. The
patient returned for a third biopsy of an additional mass in the
lateral left breast also demonstrating invasive ductal carcinoma
with associated carcinoma in situ.

LABS:  None
EXAM:
BILATERAL BREAST MRI WITH AND WITHOUT CONTRAST
TECHNIQUE: Multiplanar, multisequence MR images of both breasts were obtained
prior to and following the intravenous administration of 10 ml of
Gadavist

[Series 2: T2 · axial · 3.0mm · 0.89mm/px · z∈[-68,+106]mm · 3 of 50 slices shown]
[im 1/50]
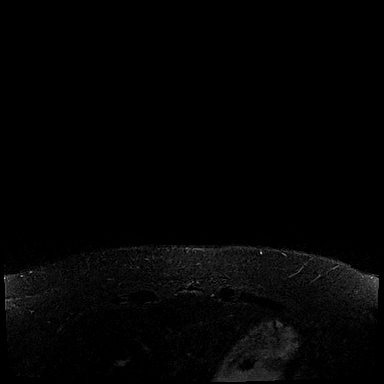
[im 25/50]
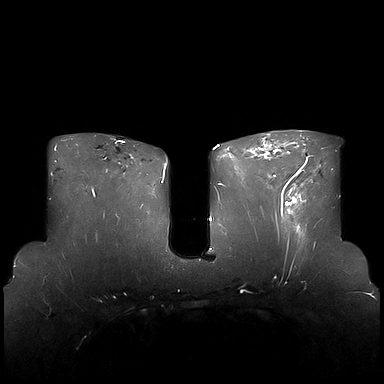
[im 50/50]
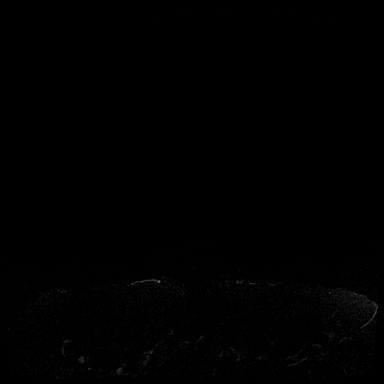

[Series 3: T1 fat-sat · axial · 1.2mm · 0.71mm/px · z∈[-66,+105]mm · 5 of 111 slices shown (1 of 4)]
[im 1/111]
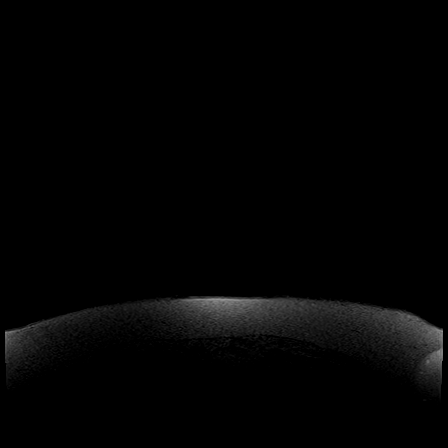
[im 28/111]
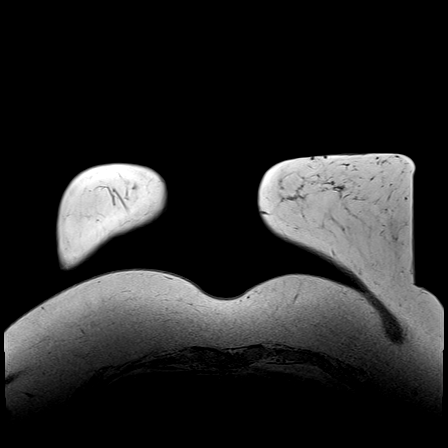
[im 56/111]
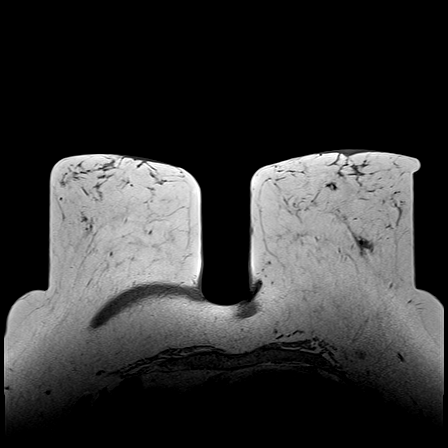
[im 83/111]
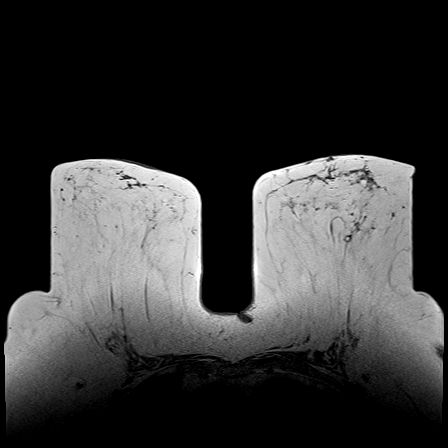
[im 111/111]
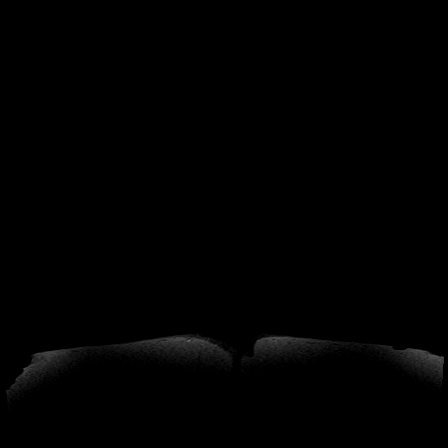

[Series 4: T1 fat-sat · axial · 1.2mm · 0.87mm/px · z∈[-66,+105]mm · 5 of 139 slices shown (2 of 4)]
[im 1/139]
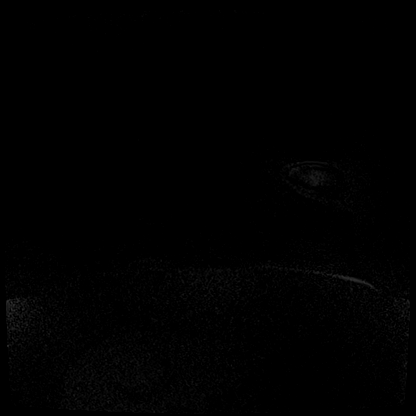
[im 35/139]
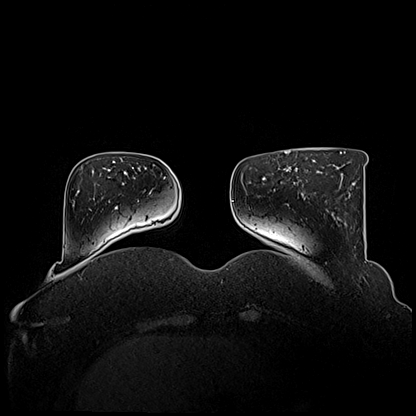
[im 70/139]
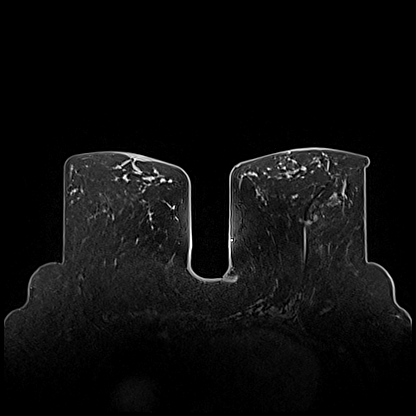
[im 104/139]
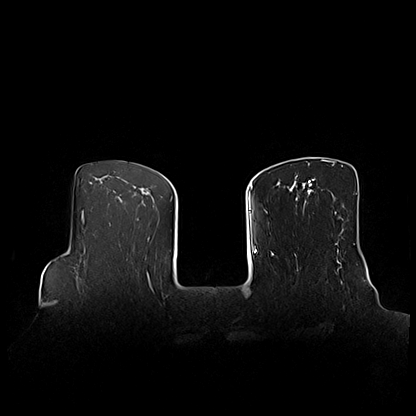
[im 139/139]
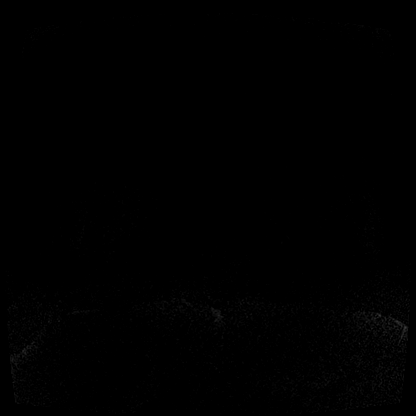

[Series 5: T1 fat-sat · axial · 1.2mm · 0.87mm/px · z∈[-66,+105]mm · 5 of 143 slices shown (3 of 4)]
[im 1/143]
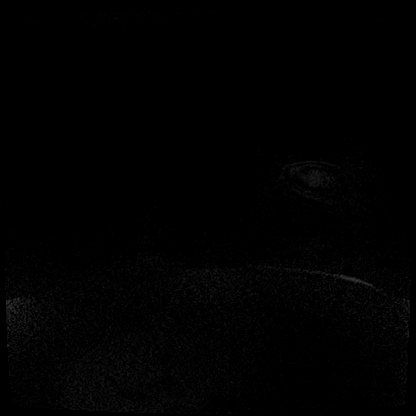
[im 36/143]
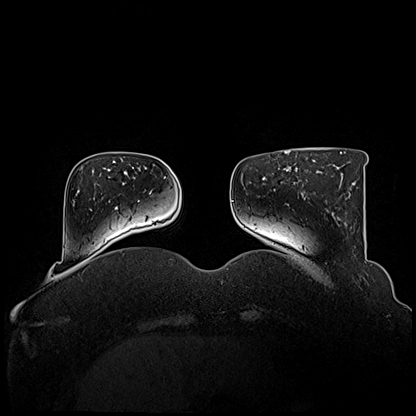
[im 72/143]
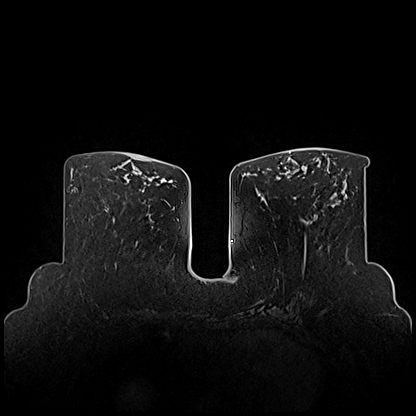
[im 107/143]
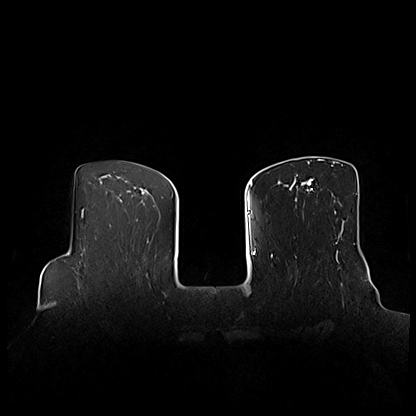
[im 143/143]
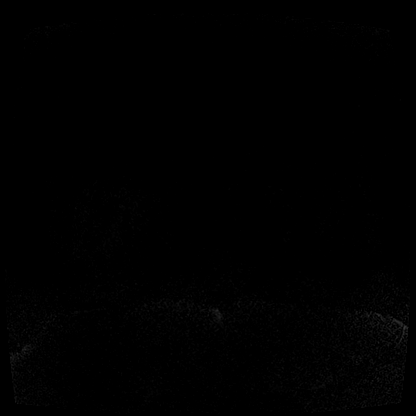

[Series 6: T1 fat-sat · axial · 1.2mm · 0.87mm/px · z∈[-66,+54]mm · 4 of 133 slices shown (4 of 4)]
[im 1/133]
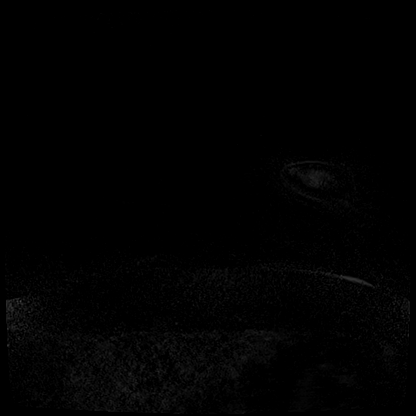
[im 34/133]
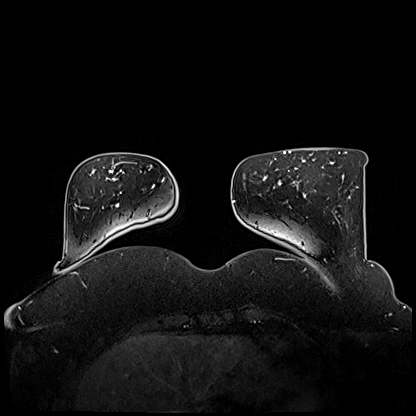
[im 67/133]
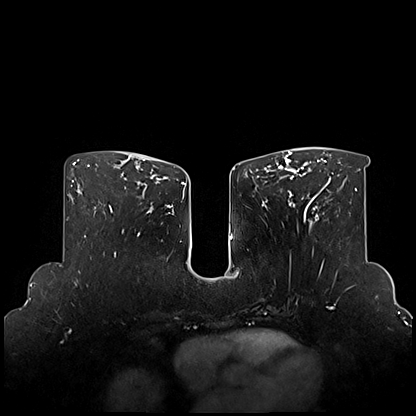
[im 100/133]
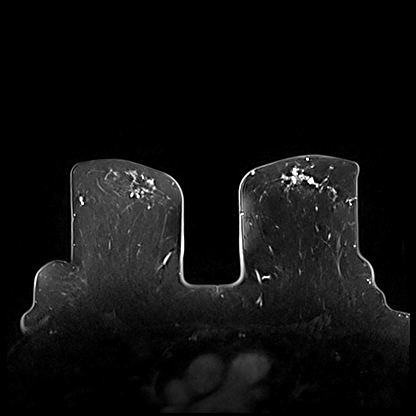

[22 of 48 positions shown; findings below may reference images not displayed]

Three-dimensional MR images were rendered by post-processing of the
original MR data on an independent workstation. The
three-dimensional MR images were interpreted, and findings are
reported in the following complete MRI report for this study. Three
dimensional images were evaluated at the independent interpreting
workstation using the DynaCAD thin client.
FINDINGS: Breast composition: b. Scattered fibroglandular tissue.

Background parenchymal enhancement: Mild

Right breast: No mass or abnormal enhancement.

Left breast: There is a biopsy track at the site of the patient's
third biopsy which demonstrated invasive carcinoma and DCIS. It is
difficult to discretely identify the biopsy clip along this track.
The biopsy clip from the initial upper-outer quadrant biopsy
demonstrating invasive malignancy and DCIS is identified. There is
clumped non mass enhancement extending between these 2 biopsy sites.
This enhancements spans 4.7 cm in AP dimension and is well seen in
the lateral left breast on series 7 on image 100.

There is scattered clumped non mass enhancement in the anterior left
breast. This clumped non mass enhancement in the anterior left
breast is seen on series 7, images 40 through 75. A representative
image demonstrating some of this clumped non mass enhancement is
image 46 and another is image 65. This clumped non mass enhancement
spans at least 4.8 by 4.6 by 2.4 cm in craniocaudal, transverse, and
AP dimensions. This enhancement is very patchy and not contiguous.
The biopsy clip in the anterior left breast is not visualized.

There is clumped linear enhancement seen on series 7, image 29
spanning 2.2 cm in the upper outer left breast.

Additional clumped non mass enhancement is seen in the upper outer
left breast on series 7, image 39 spanning up to 1.7 by 0.9 cm.

Lymph nodes: No abnormal appearing lymph nodes.

Ancillary findings:  None.
IMPRESSION: 1. The patient had 2 biopsies in the lateral left breast. There is
clumped non mass enhancement extending between the 2 biopsy sites
spanning 4.7 cm in AP dimension consistent with malignancy.
2. There is scattered clumped non mass enhancement throughout the
anterior left breast which is similar in appearance to the patient's
known malignancy in the lateral left breast. It is difficult to
discern the biopsy clip in this location which demonstrated a
papilloma with atypia. The findings are concerning for DCIS in this
region.
3. Other areas of clumped non mass enhancement are identified in the
upper outer left breast seen on series 7, image 29 spanning 2.2 cm
and series 7, image 39 spanning 1.7 cm.
4. No MRI evidence of malignancy on the right.

RECOMMENDATION:
If breast conservation is being considered, recommend 3 MRI biopsies
on the right to demonstrate extent of disease. Recommend biopsying
the linear clumped non mass enhancement seen on series 7, image 29.
Recommend biopsying the clumped non mass enhancement in the upper
outer left breast seen on series 7, image 39. Recommend biopsying
the clumped non mass enhancement in the anterior left breast
targeting either image 46 or 65.

BI-RADS CATEGORY  4: Suspicious.

ADDENDUM:
The recommended MRI guided biopsies are on the left, not the right.

*** End of Addendum ***
Three-dimensional MR images were rendered by post-processing of the
original MR data on an independent workstation. The
three-dimensional MR images were interpreted, and findings are
reported in the following complete MRI report for this study. Three
dimensional images were evaluated at the independent interpreting
workstation using the DynaCAD thin client.
FINDINGS: Breast composition: b. Scattered fibroglandular tissue.

Background parenchymal enhancement: Mild

Right breast: No mass or abnormal enhancement.

Left breast: There is a biopsy track at the site of the patient's
third biopsy which demonstrated invasive carcinoma and DCIS. It is
difficult to discretely identify the biopsy clip along this track.
The biopsy clip from the initial upper-outer quadrant biopsy
demonstrating invasive malignancy and DCIS is identified. There is
clumped non mass enhancement extending between these 2 biopsy sites.
This enhancements spans 4.7 cm in AP dimension and is well seen in
the lateral left breast on series 7 on image 100.

There is scattered clumped non mass enhancement in the anterior left
breast. This clumped non mass enhancement in the anterior left
breast is seen on series 7, images 40 through 75. A representative
image demonstrating some of this clumped non mass enhancement is
image 46 and another is image 65. This clumped non mass enhancement
spans at least 4.8 by 4.6 by 2.4 cm in craniocaudal, transverse, and
AP dimensions. This enhancement is very patchy and not contiguous.
The biopsy clip in the anterior left breast is not visualized.

There is clumped linear enhancement seen on series 7, image 29
spanning 2.2 cm in the upper outer left breast.

Additional clumped non mass enhancement is seen in the upper outer
left breast on series 7, image 39 spanning up to 1.7 by 0.9 cm.

Lymph nodes: No abnormal appearing lymph nodes.

Ancillary findings:  None.
IMPRESSION: 1. The patient had 2 biopsies in the lateral left breast. There is
clumped non mass enhancement extending between the 2 biopsy sites
spanning 4.7 cm in AP dimension consistent with malignancy.
2. There is scattered clumped non mass enhancement throughout the
anterior left breast which is similar in appearance to the patient's
known malignancy in the lateral left breast. It is difficult to
discern the biopsy clip in this location which demonstrated a
papilloma with atypia. The findings are concerning for DCIS in this
region.
3. Other areas of clumped non mass enhancement are identified in the
upper outer left breast seen on series 7, image 29 spanning 2.2 cm
and series 7, image 39 spanning 1.7 cm.
4. No MRI evidence of malignancy on the right.

RECOMMENDATION:
If breast conservation is being considered, recommend 3 MRI biopsies
on the right to demonstrate extent of disease. Recommend biopsying
the linear clumped non mass enhancement seen on series 7, image 29.
Recommend biopsying the clumped non mass enhancement in the upper
outer left breast seen on series 7, image 39. Recommend biopsying
the clumped non mass enhancement in the anterior left breast
targeting either image 46 or 65.

BI-RADS CATEGORY  4: Suspicious.

## 2022-10-10 ENCOUNTER — Ambulatory Visit
Admission: RE | Admit: 2022-10-10 | Discharge: 2022-10-10 | Disposition: A | Discharge status: Home / Self Care | Payer: Medicare PPO | Source: Ambulatory Visit | Attending: Family Medicine | Admitting: Family Medicine

## 2022-10-10 DIAGNOSIS — Z1231 Encounter for screening mammogram for malignant neoplasm of breast: Secondary | ICD-10-CM | POA: Diagnosis not present

## 2022-11-06 ENCOUNTER — Other Ambulatory Visit (HOSPITAL_BASED_OUTPATIENT_CLINIC_OR_DEPARTMENT_OTHER): Payer: Self-pay

## 2022-11-06 MED ORDER — BISACODYL 5 MG PO TBEC
5.0000 mg | DELAYED_RELEASE_TABLET | ORAL | 0 refills | Status: AC
  Filled 2022-11-06: qty 25, 5d supply, fill #0

## 2022-11-06 MED ORDER — PEG 3350-KCL-NA BICARB-NACL 420 G PO SOLR
4000.0000 mL | ORAL | 0 refills | Status: AC
  Filled 2022-11-06: qty 4000, 1d supply, fill #0

## 2022-11-23 DIAGNOSIS — Z860100 Personal history of colon polyps, unspecified: Secondary | ICD-10-CM | POA: Diagnosis not present

## 2022-11-23 DIAGNOSIS — Z09 Encounter for follow-up examination after completed treatment for conditions other than malignant neoplasm: Secondary | ICD-10-CM | POA: Diagnosis not present

## 2022-11-23 DIAGNOSIS — K5730 Diverticulosis of large intestine without perforation or abscess without bleeding: Secondary | ICD-10-CM | POA: Diagnosis not present

## 2022-11-23 DIAGNOSIS — D125 Benign neoplasm of sigmoid colon: Secondary | ICD-10-CM | POA: Diagnosis not present

## 2022-11-23 DIAGNOSIS — K648 Other hemorrhoids: Secondary | ICD-10-CM | POA: Diagnosis not present

## 2022-11-27 DIAGNOSIS — D125 Benign neoplasm of sigmoid colon: Secondary | ICD-10-CM | POA: Diagnosis not present

## 2022-12-06 ENCOUNTER — Other Ambulatory Visit (HOSPITAL_BASED_OUTPATIENT_CLINIC_OR_DEPARTMENT_OTHER): Payer: Self-pay

## 2023-03-01 ENCOUNTER — Other Ambulatory Visit: Payer: Self-pay | Admitting: Family Medicine

## 2023-03-01 DIAGNOSIS — E669 Obesity, unspecified: Secondary | ICD-10-CM | POA: Diagnosis not present

## 2023-03-01 DIAGNOSIS — I700 Atherosclerosis of aorta: Secondary | ICD-10-CM | POA: Diagnosis not present

## 2023-03-01 DIAGNOSIS — R7303 Prediabetes: Secondary | ICD-10-CM | POA: Diagnosis not present

## 2023-03-01 DIAGNOSIS — I10 Essential (primary) hypertension: Secondary | ICD-10-CM | POA: Diagnosis not present

## 2023-03-01 DIAGNOSIS — E785 Hyperlipidemia, unspecified: Secondary | ICD-10-CM | POA: Diagnosis not present

## 2023-03-01 DIAGNOSIS — Z23 Encounter for immunization: Secondary | ICD-10-CM | POA: Diagnosis not present

## 2023-03-01 DIAGNOSIS — Z1331 Encounter for screening for depression: Secondary | ICD-10-CM | POA: Diagnosis not present

## 2023-03-01 DIAGNOSIS — E2839 Other primary ovarian failure: Secondary | ICD-10-CM

## 2023-03-01 DIAGNOSIS — Z0000 Encounter for general adult medical examination without abnormal findings: Secondary | ICD-10-CM | POA: Diagnosis not present

## 2023-03-01 DIAGNOSIS — E039 Hypothyroidism, unspecified: Secondary | ICD-10-CM | POA: Diagnosis not present

## 2023-03-07 DIAGNOSIS — M7062 Trochanteric bursitis, left hip: Secondary | ICD-10-CM | POA: Diagnosis not present

## 2023-03-08 ENCOUNTER — Other Ambulatory Visit (HOSPITAL_BASED_OUTPATIENT_CLINIC_OR_DEPARTMENT_OTHER): Payer: Self-pay

## 2023-03-23 DIAGNOSIS — C50912 Malignant neoplasm of unspecified site of left female breast: Secondary | ICD-10-CM | POA: Diagnosis not present

## 2023-04-09 ENCOUNTER — Other Ambulatory Visit (HOSPITAL_BASED_OUTPATIENT_CLINIC_OR_DEPARTMENT_OTHER): Payer: Self-pay

## 2023-04-09 MED ORDER — LEVOTHYROXINE SODIUM 75 MCG PO TABS
75.0000 ug | ORAL_TABLET | Freq: Every day | ORAL | 3 refills | Status: DC
  Filled 2023-04-09: qty 30, 30d supply, fill #0
  Filled 2023-05-05: qty 30, 30d supply, fill #1
  Filled 2023-06-06: qty 30, 30d supply, fill #2
  Filled 2023-07-04: qty 30, 30d supply, fill #3

## 2023-04-24 DIAGNOSIS — H52203 Unspecified astigmatism, bilateral: Secondary | ICD-10-CM | POA: Diagnosis not present

## 2023-04-24 DIAGNOSIS — H43813 Vitreous degeneration, bilateral: Secondary | ICD-10-CM | POA: Diagnosis not present

## 2023-04-24 DIAGNOSIS — H26492 Other secondary cataract, left eye: Secondary | ICD-10-CM | POA: Diagnosis not present

## 2023-05-05 ENCOUNTER — Other Ambulatory Visit (HOSPITAL_BASED_OUTPATIENT_CLINIC_OR_DEPARTMENT_OTHER): Payer: Self-pay

## 2023-07-12 ENCOUNTER — Ambulatory Visit (HOSPITAL_BASED_OUTPATIENT_CLINIC_OR_DEPARTMENT_OTHER)
Admission: RE | Admit: 2023-07-12 | Discharge: 2023-07-12 | Disposition: A | Discharge status: Home / Self Care | Source: Ambulatory Visit | Attending: Family Medicine | Admitting: Family Medicine

## 2023-07-12 DIAGNOSIS — M85851 Other specified disorders of bone density and structure, right thigh: Secondary | ICD-10-CM | POA: Diagnosis not present

## 2023-07-12 DIAGNOSIS — Z780 Asymptomatic menopausal state: Secondary | ICD-10-CM | POA: Diagnosis not present

## 2023-07-12 DIAGNOSIS — E2839 Other primary ovarian failure: Secondary | ICD-10-CM | POA: Insufficient documentation

## 2023-08-21 DIAGNOSIS — I700 Atherosclerosis of aorta: Secondary | ICD-10-CM | POA: Diagnosis not present

## 2023-08-21 DIAGNOSIS — R7303 Prediabetes: Secondary | ICD-10-CM | POA: Diagnosis not present

## 2023-08-21 DIAGNOSIS — E039 Hypothyroidism, unspecified: Secondary | ICD-10-CM | POA: Diagnosis not present

## 2023-08-21 DIAGNOSIS — E785 Hyperlipidemia, unspecified: Secondary | ICD-10-CM | POA: Diagnosis not present

## 2023-08-22 ENCOUNTER — Other Ambulatory Visit (HOSPITAL_BASED_OUTPATIENT_CLINIC_OR_DEPARTMENT_OTHER): Payer: Self-pay

## 2023-08-22 MED ORDER — REPATHA SURECLICK 140 MG/ML ~~LOC~~ SOAJ
140.0000 mg | SUBCUTANEOUS | 11 refills | Status: AC
  Filled 2023-08-22: qty 2, 28d supply, fill #0
  Filled 2023-09-25: qty 2, 28d supply, fill #1
  Filled 2023-11-01: qty 2, 28d supply, fill #2
  Filled 2023-11-28: qty 2, 28d supply, fill #3
  Filled 2024-01-03: qty 2, 28d supply, fill #4
  Filled 2024-02-03: qty 2, 28d supply, fill #5

## 2023-08-23 ENCOUNTER — Other Ambulatory Visit (HOSPITAL_BASED_OUTPATIENT_CLINIC_OR_DEPARTMENT_OTHER): Payer: Self-pay

## 2023-08-25 ENCOUNTER — Other Ambulatory Visit (HOSPITAL_BASED_OUTPATIENT_CLINIC_OR_DEPARTMENT_OTHER): Payer: Self-pay

## 2023-08-25 ENCOUNTER — Other Ambulatory Visit: Payer: Self-pay

## 2023-08-25 MED ORDER — LEVOTHYROXINE SODIUM 75 MCG PO TABS
75.0000 ug | ORAL_TABLET | Freq: Every day | ORAL | 5 refills | Status: AC
  Filled 2023-08-25: qty 30, 30d supply, fill #0
  Filled 2023-09-25: qty 30, 30d supply, fill #1
  Filled 2023-11-01: qty 30, 30d supply, fill #2
  Filled 2023-11-28: qty 30, 30d supply, fill #3
  Filled 2024-01-03 – 2024-01-04 (×2): qty 30, 30d supply, fill #4
  Filled 2024-02-03: qty 30, 30d supply, fill #5

## 2023-09-06 ENCOUNTER — Other Ambulatory Visit: Payer: Self-pay | Admitting: Family Medicine

## 2023-09-06 DIAGNOSIS — Z1231 Encounter for screening mammogram for malignant neoplasm of breast: Secondary | ICD-10-CM

## 2023-10-11 ENCOUNTER — Ambulatory Visit
Admission: RE | Admit: 2023-10-11 | Discharge: 2023-10-11 | Disposition: A | Discharge status: Home / Self Care | Source: Ambulatory Visit | Attending: Family Medicine | Admitting: Family Medicine

## 2023-10-11 DIAGNOSIS — Z1231 Encounter for screening mammogram for malignant neoplasm of breast: Secondary | ICD-10-CM | POA: Diagnosis not present

## 2023-10-31 ENCOUNTER — Other Ambulatory Visit: Payer: Medicare PPO

## 2023-11-25 ENCOUNTER — Other Ambulatory Visit: Payer: Self-pay

## 2023-11-25 ENCOUNTER — Encounter (HOSPITAL_BASED_OUTPATIENT_CLINIC_OR_DEPARTMENT_OTHER): Payer: Self-pay | Admitting: Emergency Medicine

## 2023-11-25 ENCOUNTER — Emergency Department (HOSPITAL_BASED_OUTPATIENT_CLINIC_OR_DEPARTMENT_OTHER)
Admission: EM | Admit: 2023-11-25 | Discharge: 2023-11-25 | Disposition: A | Discharge status: Home / Self Care | Source: Home / Self Care | Attending: Emergency Medicine | Admitting: Emergency Medicine

## 2023-11-25 ENCOUNTER — Emergency Department (HOSPITAL_BASED_OUTPATIENT_CLINIC_OR_DEPARTMENT_OTHER)
Admission: EM | Admit: 2023-11-25 | Discharge: 2023-11-25 | Disposition: A | Discharge status: Home / Self Care | Attending: Emergency Medicine | Admitting: Emergency Medicine

## 2023-11-25 ENCOUNTER — Encounter (HOSPITAL_BASED_OUTPATIENT_CLINIC_OR_DEPARTMENT_OTHER): Payer: Self-pay

## 2023-11-25 ENCOUNTER — Emergency Department (HOSPITAL_BASED_OUTPATIENT_CLINIC_OR_DEPARTMENT_OTHER)

## 2023-11-25 DIAGNOSIS — W19XXXA Unspecified fall, initial encounter: Secondary | ICD-10-CM | POA: Insufficient documentation

## 2023-11-25 DIAGNOSIS — M179 Osteoarthritis of knee, unspecified: Secondary | ICD-10-CM | POA: Diagnosis not present

## 2023-11-25 DIAGNOSIS — Y93H2 Activity, gardening and landscaping: Secondary | ICD-10-CM | POA: Diagnosis not present

## 2023-11-25 DIAGNOSIS — M25561 Pain in right knee: Secondary | ICD-10-CM | POA: Insufficient documentation

## 2023-11-25 DIAGNOSIS — Z853 Personal history of malignant neoplasm of breast: Secondary | ICD-10-CM | POA: Diagnosis not present

## 2023-11-25 DIAGNOSIS — Z8542 Personal history of malignant neoplasm of other parts of uterus: Secondary | ICD-10-CM | POA: Diagnosis not present

## 2023-11-25 DIAGNOSIS — E039 Hypothyroidism, unspecified: Secondary | ICD-10-CM | POA: Diagnosis not present

## 2023-11-25 DIAGNOSIS — K029 Dental caries, unspecified: Secondary | ICD-10-CM | POA: Insufficient documentation

## 2023-11-25 DIAGNOSIS — M25461 Effusion, right knee: Secondary | ICD-10-CM | POA: Insufficient documentation

## 2023-11-25 DIAGNOSIS — M1711 Unilateral primary osteoarthritis, right knee: Secondary | ICD-10-CM | POA: Diagnosis not present

## 2023-11-25 DIAGNOSIS — I10 Essential (primary) hypertension: Secondary | ICD-10-CM | POA: Insufficient documentation

## 2023-11-25 LAB — CBC WITH DIFFERENTIAL/PLATELET
Abs Immature Granulocytes: 0.01 K/uL (ref 0.00–0.07)
Basophils Absolute: 0 K/uL (ref 0.0–0.1)
Basophils Relative: 0 %
Eosinophils Absolute: 0 K/uL (ref 0.0–0.5)
Eosinophils Relative: 1 %
HCT: 40 % (ref 36.0–46.0)
Hemoglobin: 13.3 g/dL (ref 12.0–15.0)
Immature Granulocytes: 0 %
Lymphocytes Relative: 12 %
Lymphs Abs: 0.6 K/uL — ABNORMAL LOW (ref 0.7–4.0)
MCH: 30.6 pg (ref 26.0–34.0)
MCHC: 33.3 g/dL (ref 30.0–36.0)
MCV: 92 fL (ref 80.0–100.0)
Monocytes Absolute: 0.2 K/uL (ref 0.1–1.0)
Monocytes Relative: 4 %
Neutro Abs: 4.3 K/uL (ref 1.7–7.7)
Neutrophils Relative %: 83 %
Platelets: 202 K/uL (ref 150–400)
RBC: 4.35 MIL/uL (ref 3.87–5.11)
RDW: 13.6 % (ref 11.5–15.5)
WBC: 5.2 K/uL (ref 4.0–10.5)
nRBC: 0 % (ref 0.0–0.2)

## 2023-11-25 LAB — COMPREHENSIVE METABOLIC PANEL WITH GFR
ALT: 20 U/L (ref 0–44)
AST: 18 U/L (ref 15–41)
Albumin: 4.2 g/dL (ref 3.5–5.0)
Alkaline Phosphatase: 83 U/L (ref 38–126)
Anion gap: 11 (ref 5–15)
BUN: 20 mg/dL (ref 8–23)
CO2: 25 mmol/L (ref 22–32)
Calcium: 8.9 mg/dL (ref 8.9–10.3)
Chloride: 105 mmol/L (ref 98–111)
Creatinine, Ser: 0.86 mg/dL (ref 0.44–1.00)
GFR, Estimated: >60 mL/min (ref >60)
Glucose, Bld: 129 mg/dL — ABNORMAL HIGH (ref 70–99)
Potassium: 4.3 mmol/L (ref 3.5–5.1)
Sodium: 140 mmol/L (ref 135–145)
Total Bilirubin: 0.9 mg/dL (ref 0.0–1.2)
Total Protein: 7.1 g/dL (ref 6.5–8.1)

## 2023-11-25 LAB — LIPASE, BLOOD: Lipase: 29 U/L (ref 11–51)

## 2023-11-25 MED ORDER — AMOXICILLIN 500 MG PO CAPS: 500.0000 mg | ORAL_CAPSULE | Freq: Three times a day (TID) | ORAL | 0 refills | Status: DC

## 2023-11-25 MED ORDER — BUPIVACAINE HCL (PF) 0.5 % IJ SOLN
10.0000 mL | Freq: Once | INTRAMUSCULAR | Status: AC
  Administered 2023-11-25: 10 mL
  Filled 2023-11-25: qty 10

## 2023-11-25 MED ORDER — FENTANYL CITRATE (PF) 50 MCG/ML IJ SOSY
50.0000 ug | PREFILLED_SYRINGE | Freq: Once | INTRAMUSCULAR | Status: AC
  Administered 2023-11-25: 50 ug via INTRAVENOUS
  Filled 2023-11-25: qty 1

## 2023-11-25 MED ORDER — MELOXICAM 7.5 MG PO TABS
7.5000 mg | ORAL_TABLET | Freq: Every day | ORAL | 0 refills | Status: AC
Start: 2023-11-25 — End: 2023-12-25

## 2023-11-25 MED ORDER — ONDANSETRON HCL 4 MG/2ML IJ SOLN
4.0000 mg | Freq: Once | INTRAMUSCULAR | Status: AC
  Administered 2023-11-25: 4 mg via INTRAVENOUS
  Filled 2023-11-25: qty 2

## 2023-11-25 MED ORDER — SODIUM CHLORIDE 0.9 % IV BOLUS
500.0000 mL | Freq: Once | INTRAVENOUS | Status: AC
  Administered 2023-11-25: 500 mL via INTRAVENOUS

## 2023-11-25 MED ORDER — AMOXICILLIN 500 MG PO CAPS
500.0000 mg | ORAL_CAPSULE | Freq: Three times a day (TID) | ORAL | 0 refills | Status: DC
  Filled 2023-11-25 – 2023-11-26 (×2): qty 21, 7d supply, fill #0

## 2023-11-25 MED ORDER — LIDOCAINE HCL (PF) 1 % IJ SOLN
5.0000 mL | Freq: Once | INTRAMUSCULAR | Status: AC
  Administered 2023-11-25: 5 mL
  Filled 2023-11-25: qty 5

## 2023-11-25 NOTE — ED Notes (Signed)
 Pt tearful in triage, pt instructed to slow breathing down and pt persists to breath rapidly and cry

## 2023-11-25 NOTE — ED Notes (Signed)
 Pt unable to provide UA. Aware a sample is needed.

## 2023-11-25 NOTE — ED Triage Notes (Signed)
 Lower right side of mouth has cracked tooth. Pt right knee is hurting. Pt states she was seen here earlier today for knee pain and was given a referral to ortho. C/o vomiting and diarrhea since last night.

## 2023-11-25 NOTE — Discharge Instructions (Addendum)
 Follow-up with your dentist as soon as possible.  Make an appointment to follow-up with the orthopedist as discussed.  This information was given on your prior discharge papers.  Return to the emergency room if you have any worsening symptoms.

## 2023-11-25 NOTE — ED Triage Notes (Signed)
 Pt endorses fall x 1 week pta. Pt c/o HA and RT knee pain, Denies thinners, denies loc

## 2023-11-25 NOTE — ED Provider Notes (Signed)
 [facility] Provider Note   CSN: [id number] Arrival date & time: 11/25/23  1244     Patient presents with: Elizabeth Maldonado   Elizabeth Maldonado is a 78 y.o. female. Patient with past history significant for HLD, hypothyroidism, endometrial cancer, breast cancer here with concerns of a fall. She reports a mechanical fall 1 week ago while outside clearing wood. States she fell forward on slopped hill and injured her right knee. States that she fell, impacting knee, and twisted awkwardly. Has a history of meniscal injury/repair 6 years ago. Endorses pain primarily to the inferiomedial aspect of the right knee. She did also remark striking her head when she fell one week ago. Not on thinners. States that she did not have LOC. No nausea or vomiting afterwards. Has had a dull headache since the fall that has slowly been improving.    Fall       Prior to Admission medications   Medication Sig Start Date End Date Taking? Authorizing Provider  meloxicam  (MOBIC ) 7.5 MG tablet Take 1 tablet (7.5 mg total) by mouth daily. 11/25/23 12/25/23 Yes Cain Fitzhenry A, PA-C  amLODipine  (NORVASC ) 5 MG tablet Take 1 tablet (5 mg total) by mouth daily. 08/02/21     b complex vitamins tablet Take 1 tablet by mouth daily with supper.    [provider]  bisacodyl  5 MG EC tablet Take as directed. 11/01/22     Calcium Carb-Cholecalciferol 500-400 MG-UNIT TABS Take 1 tablet by mouth daily with supper.    [provider]  Calcium Citrate-Vitamin D (CALCIUM + D PO) Take 1 tablet by mouth daily.    [provider]  Cholecalciferol (VITAMIN D) 125 MCG (5000 UT) CAPS Take 5,000 Units by mouth daily with supper.    [provider]  Coenzyme Q10 (COQ10) 200 MG CAPS Take 200 mg by mouth daily with supper.    [provider]  Evolocumab  (REPATHA  SURECLICK) 140 MG/ML SOAJ Inject 140 mg into the skin every 14 (fourteen) days. 02/20/22     Evolocumab   (REPATHA  SURECLICK) 140 MG/ML SOAJ Inject 140 mg into the skin every 14 (fourteen) days. 08/21/23     gabapentin  (NEURONTIN ) 300 MG capsule Take 1 capsule by mouth every night at bedtime 05/26/20     ibuprofen  (ADVIL ) 400 MG tablet Take 400 mg by mouth every 6 (six) hours as needed.    [provider]  levothyroxine  (SYNTHROID ) 75 MCG tablet Take 1 tablet (75 mcg total) by mouth daily. 03/18/22     levothyroxine  (SYNTHROID ) 75 MCG tablet Take 1 tablet (75 mcg total) by mouth daily. 08/25/23     levothyroxine  (SYNTHROID , LEVOTHROID) 75 MCG tablet Take 75 mcg by mouth daily before breakfast.    [provider]  methocarbamol  (ROBAXIN ) 750 MG tablet Take 1 tablet (750 mg total) by mouth in the morning and 1 tablet (750 mg total) at noon and 1 tablet (750 mg total) in the evening and 1 tablet (750 mg total) before bedtime. Do all this for 10 days. 11/29/21     Multiple Vitamin (MULTIVITAMIN WITH MINERALS) TABS tablet Take 1 tablet by mouth daily with supper.    [provider]  polyethylene glycol-electrolytes (NULYTELY) 420 g solution Take as directed. 11/01/22     REPATHA  SURECLICK 140 MG/ML SOAJ Inject 140 mg into the skin every 14 (fourteen) days. 02/14/19   [provider]  tamoxifen  (NOLVADEX ) 20 MG tablet Take 1 tablet (20 mg total) by  mouth daily. Start Feb 02, 2021 12/20/20   Magrinat, Sandria BROCKS, MD  Tetrahydrozoline HCl (VISINE OP) Place 1 drop into both eyes daily as needed (irritation/itchy eyes).    [provider]  Turmeric 500 MG CAPS Take by mouth.    [provider]    Allergies: Crestor [rosuvastatin], Lipitor [atorvastatin], Colestipol hcl, Acetaminophen , and Vytorin [ezetimibe-simvastatin]    Review of Systems  Musculoskeletal:        Knee pain  All other systems reviewed and are negative.   Updated Vital Signs BP 124/73   Pulse 87   Temp 98 F (36.7 C) (Oral)   Resp 20   Wt 88.5 kg   LMP  (LMP Unknown)   SpO2 99%   BMI  36.84 kg/m   Physical Exam Vitals and nursing note reviewed.  Constitutional:      General: She is not in acute distress.    Appearance: She is well-developed.  HENT:     Head: Normocephalic and atraumatic.  Eyes:     Conjunctiva/sclera: Conjunctivae normal.  Cardiovascular:     Rate and Rhythm: Normal rate and regular rhythm.     Heart sounds: No murmur heard. Pulmonary:     Effort: Pulmonary effort is normal. No respiratory distress.     Breath sounds: Normal breath sounds.  Abdominal:     Palpations: Abdomen is soft.     Tenderness: There is no abdominal tenderness.  Musculoskeletal:        General: Tenderness present. No swelling, deformity or signs of injury. Normal range of motion.     Cervical back: Neck supple.     Comments: Valgus varus stress increases pain along the medial compartment of the right knee. Negative Lachman's. Negative McMurray. No appreciable joint swelling, crepitus, or locking.  Skin:    General: Skin is warm and dry.     Capillary Refill: Capillary refill takes less than 2 seconds.  Neurological:     Mental Status: She is alert.  Psychiatric:        Mood and Affect: Mood normal.     (all labs ordered are listed, but only abnormal results are displayed) Labs Reviewed - No data to display  EKG: None  Radiology: DG Knee Complete 4 Views Right Result Date: 11/25/2023 CLINICAL DATA:  fall, pain EXAM: RIGHT KNEE - COMPLETE 4+ VIEW COMPARISON:  December 02, 2015 FINDINGS: No acute fracture or dislocation. Mild-to-moderate joint space narrowing with osteophyte formation of the lateral compartment. Small enthesophyte of the quadriceps tendon. No area of erosion or osseous destruction. No unexpected radiopaque foreign body. Soft tissues are unremarkable. IMPRESSION: 1. No acute fracture or dislocation. 2. Mild-to-moderate degenerative changes of the lateral compartment. Electronically Signed   By: Corean Salter M.D.   On: 11/25/2023 13:23      Procedures   Medications Ordered in the ED - No data to display                                  Medical Decision Making Amount and/or Complexity of Data Reviewed Radiology: ordered.   This patient presents to the ED for concern of fall. Differential diagnosis includes knee pain, MCL injury, meniscal injury    Additional history obtained:  Additional history obtained from chart review   Imaging Studies ordered:  I ordered imaging studies including x-ray of the right knee I independently visualized and interpreted imaging which showed no acute fracture or  dislocation, mild osteoarthritis of the right knee I agree with the radiologist interpretation   Problem List / ED Course:  Patient presented to the emergency department with concerns of knee pain.  Reportedly fell while trying to clean trees about 1 week ago.  States that she landed on the right knee and then twisted.  Endorsing pain to the medial aspect of the knee.  She reports prior history of meniscal injury/tear.  She had surgery performed about 6 years ago with surgeon through [facility].  She also endorses hitting her head at the time when she fell 1 week ago.  Says she has a mild headache that has slowly improved.  She has been try to take Tylenol  and Aleve at home.  On exam, patient has tenderness to the medial aspect of the knee.  Ligamental testing is largely unremarkable with exception of pain to the medial portion of knee with valgus and varus stress testing. X-ray of the right knee is negative for any acute findings.  Mild osteoarthritis is seen.  Given pain in the areas on exam, I am concerned for possible MCL injury.  Will place patient into a knee sleeve due to age and likely not doing well with knee immobilizer and crutches.  A prescription for meloxicam  was also inside of her pharmacy.  Return precautions discussed such as concerns for new or worsening symptoms.  Provided patient with contact permission  for orthopedics so she can follow-up.  She is otherwise stable for outpatient follow-up and discharged home.   Social Determinants of Health:  None  Final diagnoses:  Acute pain of right knee  Fall, initial encounter    ED Discharge Orders          Ordered    meloxicam  (MOBIC ) 7.5 MG tablet  Daily        11/25/23 1355               Akbar Sacra A, PA-C 11/25/23 1401    Mannie Pac T, DO 12/04/23 0701

## 2023-11-25 NOTE — ED Provider Notes (Signed)
 [facility] Provider Note   CSN: [id number] Arrival date & time: 11/25/23  1747     Patient presents with: Dental Pain   Elizabeth Maldonado is a 78 y.o. female.   Patient is a 78 year old female with a history of hyperlipidemia, hypothyroidism, breast cancer who presents with toothache and knee pain.  She was here earlier for knee pain.  She said she fell a week or week and a half ago and injured her right knee.  Has been hurting since then.  She was here earlier today and had x-rays which did not show any acute fracture or other abnormality.  She said it is continuing to hurt and she is also having worsening tooth pain.  She was told by her dentist that she had a cracked tooth in her right lower back molar in January.  She says it has been giving her some trouble but has been hurting worse over the last 3 days.  She has had some nausea vomiting and diarrhea over the weekend which she attributes to her worsening pain.  She has been taking some over-the-counter medications without improvement in symptoms.  She denies any fevers.  No abdominal pain.  No urinary symptoms.       Prior to Admission medications   Medication Sig Start Date End Date Taking? Authorizing Provider  amLODipine  (NORVASC ) 5 MG tablet Take 1 tablet (5 mg total) by mouth daily. 08/02/21     b complex vitamins tablet Take 1 tablet by mouth daily with supper.    [provider]  bisacodyl  5 MG EC tablet Take as directed. 11/01/22     Calcium Carb-Cholecalciferol 500-400 MG-UNIT TABS Take 1 tablet by mouth daily with supper.    [provider]  Calcium Citrate-Vitamin D (CALCIUM + D PO) Take 1 tablet by mouth daily.    [provider]  Cholecalciferol (VITAMIN D) 125 MCG (5000 UT) CAPS Take 5,000 Units by mouth daily with supper.    [provider]  Coenzyme Q10 (COQ10) 200 MG CAPS Take 200 mg by mouth daily with supper.    [provider]   Evolocumab  (REPATHA  SURECLICK) 140 MG/ML SOAJ Inject 140 mg into the skin every 14 (fourteen) days. 02/20/22     Evolocumab  (REPATHA  SURECLICK) 140 MG/ML SOAJ Inject 140 mg into the skin every 14 (fourteen) days. 08/21/23     gabapentin  (NEURONTIN ) 300 MG capsule Take 1 capsule by mouth every night at bedtime 05/26/20     ibuprofen  (ADVIL ) 400 MG tablet Take 400 mg by mouth every 6 (six) hours as needed.    [provider]  levothyroxine  (SYNTHROID ) 75 MCG tablet Take 1 tablet (75 mcg total) by mouth daily. 03/18/22     levothyroxine  (SYNTHROID ) 75 MCG tablet Take 1 tablet (75 mcg total) by mouth daily. 08/25/23     levothyroxine  (SYNTHROID , LEVOTHROID) 75 MCG tablet Take 75 mcg by mouth daily before breakfast.    [provider]  meloxicam  (MOBIC ) 7.5 MG tablet Take 1 tablet (7.5 mg total) by mouth daily. 11/25/23 12/25/23  Zelaya, Oscar A, PA-C  methocarbamol  (ROBAXIN ) 750 MG tablet Take 1 tablet (750 mg total) by mouth in the morning and 1 tablet (750 mg total) at noon and 1 tablet (750 mg total) in the evening and 1 tablet (750 mg total) before bedtime. Do all this for 10 days. 11/29/21     Multiple Vitamin (MULTIVITAMIN WITH MINERALS) TABS tablet Take 1 tablet by  mouth daily with supper.    [provider]  polyethylene glycol-electrolytes (NULYTELY) 420 g solution Take as directed. 11/01/22     REPATHA  SURECLICK 140 MG/ML SOAJ Inject 140 mg into the skin every 14 (fourteen) days. 02/14/19   [provider]  tamoxifen  (NOLVADEX ) 20 MG tablet Take 1 tablet (20 mg total) by mouth daily. Start Feb 02, 2021 12/20/20   Magrinat, Sandria BROCKS, MD  Tetrahydrozoline HCl (VISINE OP) Place 1 drop into both eyes daily as needed (irritation/itchy eyes).    [provider]  Turmeric 500 MG CAPS Take by mouth.    [provider]    Allergies: Crestor [rosuvastatin], Lipitor [atorvastatin], Colestipol hcl, Acetaminophen , and Vytorin [ezetimibe-simvastatin]     Review of Systems  Constitutional:  Negative for chills, diaphoresis, fatigue and fever.  HENT:  Positive for dental problem. Negative for congestion, rhinorrhea and sneezing.   Eyes: Negative.   Respiratory:  Negative for cough, chest tightness and shortness of breath.   Cardiovascular:  Negative for chest pain and leg swelling.  Gastrointestinal:  Positive for diarrhea, nausea and vomiting. Negative for abdominal pain.  Genitourinary:  Negative for difficulty urinating, flank pain and frequency.  Musculoskeletal:  Negative for arthralgias and back pain.  Skin:  Negative for rash.  Neurological:  Negative for dizziness, speech difficulty, weakness, numbness and headaches.    Updated Vital Signs BP (!) 151/74 (BP Location: Right Arm)   Pulse (!) 101   Temp 98.2 F (36.8 C) (Oral)   Resp (!) 30   Ht 5' 1 (1.549 m)   Wt 88.4 kg   LMP  (LMP Unknown)   SpO2 98%   BMI 36.82 kg/m   Physical Exam Constitutional:      Appearance: She is well-developed.  HENT:     Head: Normocephalic and atraumatic.     Mouth/Throat:     Comments: Mild tenderness over the right lower back molar.  There is some evidence of decay.  There is no swelling around the tooth.  No facial swelling.  No induration or fluctuance.  Uvula is midline, no trismus Eyes:     Pupils: Pupils are equal, round, and reactive to light.  Cardiovascular:     Rate and Rhythm: Normal rate and regular rhythm.     Heart sounds: Normal heart sounds.  Pulmonary:     Effort: Pulmonary effort is normal. No respiratory distress.     Breath sounds: Normal breath sounds. No wheezing or rales.  Chest:     Chest wall: No tenderness.  Abdominal:     General: Bowel sounds are normal.     Palpations: Abdomen is soft.     Tenderness: There is no abdominal tenderness. There is no guarding or rebound.  Musculoskeletal:        General: Normal range of motion.     Cervical back: Normal range of motion and neck supple.     Comments:  Positive diffuse swelling to the right knee.  No warmth or erythema.  No significant swelling.  Unable to adequately get a ligament exam due to her discomfort.  No significant effusion noted.  No pain at the hip or ankle.  Pedal pulses intact.  She has normal sensation and motor function distally.  Lymphadenopathy:     Cervical: No cervical adenopathy.  Skin:    General: Skin is warm and dry.     Findings: No rash.  Neurological:     Mental Status: She is alert and oriented to person,  place, and time.     (all labs ordered are listed, but only abnormal results are displayed) Labs Reviewed  COMPREHENSIVE METABOLIC PANEL WITH GFR  LIPASE, BLOOD  CBC WITH DIFFERENTIAL/PLATELET  URINALYSIS, W/ REFLEX TO CULTURE (INFECTION SUSPECTED)    EKG: None  Radiology: DG Knee Complete 4 Views Right Result Date: 11/25/2023 CLINICAL DATA:  fall, pain EXAM: RIGHT KNEE - COMPLETE 4+ VIEW COMPARISON:  December 02, 2015 FINDINGS: No acute fracture or dislocation. Mild-to-moderate joint space narrowing with osteophyte formation of the lateral compartment. Small enthesophyte of the quadriceps tendon. No area of erosion or osseous destruction. No unexpected radiopaque foreign body. Soft tissues are unremarkable. IMPRESSION: 1. No acute fracture or dislocation. 2. Mild-to-moderate degenerative changes of the lateral compartment. Electronically Signed   By: Corean Salter M.D.   On: 11/25/2023 13:23     Dental Block  Date/Time: 11/25/2023 9:13 PM  Performed by: Elizabeth Hollering, MD Authorized by: Elizabeth Hollering, MD   Consent:    Consent obtained:  Verbal   Consent given by:  Patient   Risks, benefits, and alternatives were discussed: yes     Risks discussed:  Infection, pain, hematoma, allergic reaction, swelling and unsuccessful block   Alternatives discussed:  No treatment Universal protocol:    Patient identity confirmed:  Verbally with patient Indications:    Indications: dental pain    Location:    Block type:  Inferior alveolar   Laterality:  Right Procedure details:    Syringe type:  Controlled syringe   Needle gauge:  25 G   Anesthetic injected:  Bupivacaine  0.5% w/o epi and lidocaine  1% w/o epi   Injection procedure:  Anatomic landmarks identified, introduced needle, incremental injection, anatomic landmarks palpated and negative aspiration for blood Post-procedure details:    Outcome:  Pain relieved   Procedure completion:  Tolerated well, no immediate complications    Medications Ordered in the ED  sodium chloride  0.9 % bolus 500 mL (has no administration in time range)  ondansetron  (ZOFRAN ) injection 4 mg (has no administration in time range)  fentaNYL  (SUBLIMAZE ) injection 50 mcg (has no administration in time range)                                    Medical Decision Making Amount and/or Complexity of Data Reviewed Labs: ordered.  Risk Prescription drug management.   This patient presents to the ED for concern of knee pain, dental pain, this involves an extensive number of treatment options, and is a complaint that carries with it a high risk of complications and morbidity.  I considered the following differential and admission for this acute, potentially life threatening condition.  The differential diagnosis includes dental abscess, nerve damage, musculoskeletal pain, gastroenteritis  MDM:    Patient is a 78 year old who presents with worsening pain in her right tooth as well as ongoing pain in her right knee.  She was seen here earlier today for knee pain.  X-rays did not reveal any fracture or other bony injury.  She was given a prescription for meloxicam .  She has been having worsening pain in her tooth and has had associated nausea vomiting and diarrhea.  She was given IV fluids and pain medication here in the ED.  Her labs reviewed and are nonconcerning.  She is feeling much better.  She is tolerating oral fluids without difficulty.  She is not  febrile.  She does not have  associated abdominal pain which would be more concerning for an abdominal infection or other pain such as bowel obstruction.  Her knee pain has resolved.  A dental block was performed and her tooth pain markedly improved.  She is ready to go home.  She will continue her meloxicam .  She has information about following up with an orthopedist regarding her knee and will make an appointment to follow-up with her dentist regarding her tooth.  She was discharged home in good condition.  Return precautions were given.  (Labs, imaging, consults)  Labs: I Ordered, and personally interpreted labs.  The pertinent results include: Normal WBC count, electrolytes nonconcerning, normal creatinine  Imaging Studies ordered: I ordered imaging studies including x-ray from earlier today of the right knee I independently visualized and interpreted imaging. I agree with the radiologist interpretation  Additional history obtained from husband at bedside.  External records from outside source obtained and reviewed including history  Cardiac Monitoring: The patient was maintained on a cardiac monitor.  If on the cardiac monitor, I personally viewed and interpreted the cardiac monitored which showed an underlying rhythm of: Sinus tachycardia that resolved to a normal sinus rhythm  Reevaluation: After the interventions noted above, I reevaluated the patient and found that they have :improved  Social Determinants of Health:    Disposition: Discharged to home  Co morbidities that complicate the patient evaluation  Past Medical History:  Diagnosis Date   BRCA gene mutation negative in female    Breast cancer (HCC) 02/12/2020   Diverticulosis    Elevated blood pressure reading    165/71 a pre-op appt, reports she has seen her primary care in the last few months and intheir office it was in the 140s systolic, was not started on meds at that time, says her bp has been creeping up since  all this stuff started , denies acute cardiac symptoms    Family history of breast cancer    Family history of colon cancer    Family history of melanoma    Hearing loss    wears hearing aids   History of anemia as a child    History of hiatal hernia    Hypercholesteremia    diet controlled   Hyperlipidemia 02/16/2016   Hypothyroid    Hypothyroidism 02/16/2016     Medicines Meds ordered this encounter  Medications   sodium chloride  0.9 % bolus 500 mL   ondansetron  (ZOFRAN ) injection 4 mg   fentaNYL  (SUBLIMAZE ) injection 50 mcg   bupivacaine (PF) (MARCAINE ) 0.5 % injection 10 mL   lidocaine  (PF) (XYLOCAINE ) 1 % injection 5 mL   DISCONTD: amoxicillin  (AMOXIL ) 500 MG capsule    Sig: Take 1 capsule (500 mg total) by mouth 3 (three) times daily.    Dispense:  21 capsule    Refill:  0   amoxicillin  (AMOXIL ) 500 MG capsule    Sig: Take 1 capsule (500 mg total) by mouth 3 (three) times daily.    Dispense:  21 capsule    Refill:  0    I have reviewed the patients home medicines and have made adjustments as needed  Problem List / ED Course: Problem List Items Addressed This Visit   None Visit Diagnoses       Dental caries    -  Primary     Acute pain of right knee                    Final diagnoses:  None  ED Discharge Orders     None          Elizabeth Hollering, MD 11/25/23 2117

## 2023-11-25 NOTE — Discharge Instructions (Addendum)
 You were seen in the ER today for concerns of a fall. Your xray of your knee was negative for any fractures or bony injury. Your exam has notable pain along the inner portion of your knee which could be due to an MCL injury which is one of the supportive ligaments to the knee. I am placing you into a knee sleeve to provide support to your knee. You can remove this sleeve when sleeping or showering. Follow up closely with orthopedics for further evaluation. Return to the ER for any concerns of new or worsening symptoms.

## 2023-11-25 NOTE — ED Notes (Signed)
 ED Provider at bedside.

## 2023-11-26 ENCOUNTER — Other Ambulatory Visit (HOSPITAL_BASED_OUTPATIENT_CLINIC_OR_DEPARTMENT_OTHER): Payer: Self-pay

## 2023-12-03 ENCOUNTER — Ambulatory Visit (INDEPENDENT_AMBULATORY_CARE_PROVIDER_SITE_OTHER): Admitting: Student

## 2023-12-03 DIAGNOSIS — S83411A Sprain of medial collateral ligament of right knee, initial encounter: Secondary | ICD-10-CM | POA: Diagnosis not present

## 2023-12-03 DIAGNOSIS — M25561 Pain in right knee: Secondary | ICD-10-CM

## 2023-12-03 NOTE — Progress Notes (Signed)
 Chief Complaint: Right knee pain    Discussed the use of AI scribe software for clinical note transcription with the patient, who gave verbal consent to proceed.  History of Present Illness Elizabeth Maldonado is a 78 year old female who presents with right knee pain following a fall.  Approximately three weeks ago she fell while dragging brush up a muddy slope, injuring her head and knees. Right knee pain worsened over time. About a week ago she developed severe right knee pain and went to the ER, where she was given a knee brace. With brace use and reduced weight bearing her symptoms improved. She now describes medial knee soreness rather than pain. She had right knee meniscus surgery in 2019 for a complex lateral meniscus tear and had been asymptomatic until this fall. She takes ibuprofen  600 mg every six hours, which she feels also improves her knee symptoms. She notes occasional clicking in the knee without buckling or locking and estimates current knee function at 80% of baseline prior to the fall.   Surgical History:   Right knee lateral meniscus repair June 2019  PMH/PSH/Family History/Social History/Meds/Allergies:    Past Medical History:  Diagnosis Date   BRCA gene mutation negative in female    Breast cancer (HCC) 02/12/2020   Diverticulosis    Elevated blood pressure reading    165/71 a pre-op appt, reports she has seen her primary care in the last few months and intheir office it was in the 140s systolic, was not started on meds at that time, says her bp has been creeping up since all this stuff started , denies acute cardiac symptoms    Family history of breast cancer    Family history of colon cancer    Family history of melanoma    Hearing loss    wears hearing aids   History of anemia as a child    History of hiatal hernia    Hypercholesteremia    diet controlled   Hyperlipidemia 02/16/2016   Hypothyroid    Hypothyroidism 02/16/2016    Past Surgical History:  Procedure Laterality Date   ABDOMINAL HYSTERECTOMY     APPENDECTOMY     BUNIONECTOMY     CATARACT EXTRACTION     CATARACT EXTRACTION, BILATERAL     CESAREAN SECTION     CHOLECYSTECTOMY     DILATION AND CURETTAGE OF UTERUS     EYE SURGERY     FOOT SURGERY Right 2011   bone graft    HYSTEROSCOPY WITH D & C N/A 06/11/2018   Procedure: DILATATION AND CURETTAGE /HYSTEROSCOPY WITH MYOSURE POLYPECTOMY;  Surgeon: Corene Coy, MD;  Location: [facility];  Service: Gynecology;  Laterality: N/A;   MASS EXCISION Left 01/10/2021   Procedure: Excision of left excess mastectomy skin;  Surgeon: Elisabeth Craig RAMAN, MD;  Location: [facility];  Service: Plastics;  Laterality: Left;   MASTECTOMY W/ SENTINEL NODE BIOPSY Left 04/19/2020   Procedure: LEFT MASTECTOMY WITH SENTINEL LYMPH NODE BIOPSY;  Surgeon: Vernetta Berg, MD;  Location: [facility];  Service: General;  Laterality: Left;   NASAL SEPTUM SURGERY     at age 6   ROBOTIC ASSISTED TOTAL HYSTERECTOMY WITH BILATERAL SALPINGO OOPHERECTOMY Bilateral 07/17/2018   Procedure: XI ROBOTIC ASSISTED TOTAL HYSTERECTOMY WITH BILATERAL SALPINGO OOPHORECTOMY AND LEFT PELVIC LYMPADECTOMY;  Surgeon: Eloy Herring, MD;  Location: [facility];  Service: Gynecology;  Laterality: Bilateral;   SENTINEL NODE BIOPSY N/A 07/17/2018   Procedure: SENTINEL NODE BIOPSY;  Surgeon: Eloy Herring, MD;  Location: [facility];  Service: Gynecology;  Laterality: N/A;   TONSILLECTOMY     torn meniscus repair Right    Social History   Socioeconomic History   Marital status: Married    Spouse name: Not on file   Number of children: Not on file   Years of education: Not on file   Highest education level: Not on file  Occupational History   Not on file  Tobacco Use   Smoking status: Never   Smokeless tobacco: Never  Vaping Use   Vaping status: Never Used  Substance and Sexual Activity   Alcohol use: Not Currently    Comment: one to two beers a year    Drug use: No   Sexual activity: Not on file  Other Topics Concern   Not on file  Social History Narrative   Not on file   Social Drivers of Health   Financial Resource Strain: Not on file  Food Insecurity: Not on file  Transportation Needs: Not on file  Physical Activity: Not on file  Stress: Not on file  Social Connections: Not on file   Family History  Problem Relation Age of Onset   Hypertension Mother    Heart attack Mother    Heart attack Father    Lung cancer Father    Skin cancer Father    Diabetes Sister    Cervical cancer Sister    Skin cancer Sister    Diabetes Brother    CAD Brother    Lymphoma Brother    Skin cancer Brother    Diabetes Brother    Melanoma Brother    Melanoma Brother    Prostate cancer Brother    Diverticulitis Brother    Skin cancer Brother    Skin cancer Sister    Breast cancer Maternal Aunt 35   Colon cancer Paternal Grandmother    Lung cancer Maternal Aunt    Leukemia Cousin    Lung cancer Cousin    Allergies  Allergen Reactions   Crestor [Rosuvastatin] Other (See Comments)    myalgia   Lipitor [Atorvastatin] Other (See Comments)    myalgia   Colestipol Hcl Other (See Comments)   Acetaminophen  Itching and Other (See Comments)    hyperactivity   Vytorin [Ezetimibe-Simvastatin] Other (See Comments)    myalgia   Current Outpatient Medications  Medication Sig Dispense Refill   amLODipine  (NORVASC ) 5 MG tablet Take 1 tablet (5 mg total) by mouth daily. 90 tablet 1   amoxicillin  (AMOXIL ) 500 MG capsule Take 1 capsule (500 mg total) by mouth 3 (three) times daily. 21 capsule 0   b complex vitamins tablet Take 1 tablet by mouth daily with supper.     bisacodyl  5 MG EC tablet Take as directed. 25 tablet 0   Calcium Carb-Cholecalciferol 500-400 MG-UNIT TABS Take 1 tablet by mouth daily with supper.     Calcium Citrate-Vitamin D (CALCIUM + D PO) Take 1 tablet by mouth daily.     Cholecalciferol (VITAMIN D) 125 MCG (5000 UT) CAPS  Take 5,000 Units by mouth daily with supper.     Coenzyme Q10 (COQ10) 200 MG CAPS Take 200 mg by mouth daily with supper.     Evolocumab  (REPATHA  SURECLICK) 140 MG/ML SOAJ Inject 140 mg into the skin every 14 (fourteen) days.  2 mL 11   Evolocumab  (REPATHA  SURECLICK) 140 MG/ML SOAJ Inject 140 mg into the skin every 14 (fourteen) days. 2 mL 11   gabapentin  (NEURONTIN ) 300 MG capsule Take 1 capsule by mouth every night at bedtime 90 capsule 4   ibuprofen  (ADVIL ) 400 MG tablet Take 400 mg by mouth every 6 (six) hours as needed.     levothyroxine  (SYNTHROID ) 75 MCG tablet Take 1 tablet (75 mcg total) by mouth daily. 90 tablet 3   levothyroxine  (SYNTHROID ) 75 MCG tablet Take 1 tablet (75 mcg total) by mouth daily. 30 tablet 5   levothyroxine  (SYNTHROID , LEVOTHROID) 75 MCG tablet Take 75 mcg by mouth daily before breakfast.     meloxicam  (MOBIC ) 7.5 MG tablet Take 1 tablet (7.5 mg total) by mouth daily. 30 tablet 0   methocarbamol  (ROBAXIN ) 750 MG tablet Take 1 tablet (750 mg total) by mouth in the morning and 1 tablet (750 mg total) at noon and 1 tablet (750 mg total) in the evening and 1 tablet (750 mg total) before bedtime. Do all this for 10 days. 40 tablet 0   Multiple Vitamin (MULTIVITAMIN WITH MINERALS) TABS tablet Take 1 tablet by mouth daily with supper.     polyethylene glycol-electrolytes (NULYTELY) 420 g solution Take as directed. 4000 mL 0   REPATHA  SURECLICK 140 MG/ML SOAJ Inject 140 mg into the skin every 14 (fourteen) days.     tamoxifen  (NOLVADEX ) 20 MG tablet Take 1 tablet (20 mg total) by mouth daily. Start Feb 02, 2021 90 tablet 4   Tetrahydrozoline HCl (VISINE OP) Place 1 drop into both eyes daily as needed (irritation/itchy eyes).     Turmeric 500 MG CAPS Take by mouth.     No current facility-administered medications for this visit.   No results found.  Review of Systems:   A ROS was performed including pertinent positives and negatives as documented in the HPI.  Physical Exam  :   Constitutional: NAD and appears stated age Neurological: Alert and oriented Psych: Appropriate affect and cooperative There were no vitals taken for this visit.   Comprehensive Musculoskeletal Exam:    Active range of motion of the right knee is from 0 to 130 degrees without palpable crepitus.  Tenderness over the medial joint line and MCL distribution.  Only mild discomfort elicited with valgus stress without any significant laxity or gapping.  Patient ambulating well without use of assistive device.  Imaging:   Xray review from 11/25/2023 (right knee 4 views): Negative for acute bony abnormalities.  Mild to moderate degenerative changes within the lateral compartment with joint space narrowing and osteophyte formation.   I personally reviewed and interpreted the radiographs.      Assessment & Plan Right knee medial collateral ligament (MCL) sprain with associated pain   Patient experienced a fall approximately 3 weeks ago resulting in right knee pain.  Pain is located mainly medially, with high clinical suspicion for MCL sprain based on exam. Symptoms have improved by 80% with brace use and rest. Conservative management is preferred and symptoms are expected to fully resolve.  Continue using the knee brace for support and take ibuprofen  600 mg every 6 hours as needed for pain. Monitor symptoms and report any worsening pain or new symptoms. Consider an MRI if symptoms do not improve or worsen.  Mild osteoarthritis of right knee   Mild osteoarthritis is present in the right knee, more pronounced in the lateral compartment, likely resultant from previous meniscal tear with surgical  repair. Conservative management is preferred due to mild arthritis and symptom improvement. Continue using the brace and ibuprofen , and monitor for any changes in symptoms or functional limitations.       I personally saw and evaluated the patient, and participated in the management and treatment  plan.  Leonce Reveal, PA-C Orthopedics

## 2023-12-05 ENCOUNTER — Other Ambulatory Visit (HOSPITAL_BASED_OUTPATIENT_CLINIC_OR_DEPARTMENT_OTHER): Payer: Self-pay

## 2024-01-04 ENCOUNTER — Other Ambulatory Visit (HOSPITAL_BASED_OUTPATIENT_CLINIC_OR_DEPARTMENT_OTHER): Payer: Self-pay

## 2024-01-07 ENCOUNTER — Other Ambulatory Visit (HOSPITAL_BASED_OUTPATIENT_CLINIC_OR_DEPARTMENT_OTHER): Payer: Self-pay

## 2024-01-08 ENCOUNTER — Other Ambulatory Visit: Payer: Self-pay

## 2024-01-31 ENCOUNTER — Encounter: Payer: Self-pay | Admitting: Emergency Medicine

## 2024-01-31 ENCOUNTER — Ambulatory Visit
Admission: EM | Admit: 2024-01-31 | Discharge: 2024-01-31 | Disposition: A | Discharge status: Home / Self Care | Attending: Family Medicine | Admitting: Family Medicine

## 2024-01-31 ENCOUNTER — Ambulatory Visit

## 2024-01-31 DIAGNOSIS — M79601 Pain in right arm: Secondary | ICD-10-CM

## 2024-01-31 DIAGNOSIS — M25511 Pain in right shoulder: Secondary | ICD-10-CM

## 2024-01-31 MED ORDER — IBUPROFEN 600 MG PO TABS
600.0000 mg | ORAL_TABLET | Freq: Once | ORAL | Status: AC
  Administered 2024-01-31: 600 mg via ORAL

## 2024-01-31 NOTE — Discharge Instructions (Addendum)
 You were seen today for arm and shoulder pain after an injury.  Your xray appears normal.  If the radiologist reads this differently we will notify you.  I have given you a sling for comfort.  Please continue motrin  for pain as needed, and use ice.  Once starting to feel better you may take your arm out and gently move the arm/shoulder to maintain movement.  If you continue with pain or not improving in several days you need to follow up with your primary care provider or an orthopedist.  You may call [facility] in [place] at [phone], or [facility] at [phone].

## 2024-01-31 NOTE — ED Provider Notes (Addendum)
 " [facility]    CSN: [id number] Arrival date & time: 01/31/24  1251      History   Chief Complaint Chief Complaint  Patient presents with   Arm Pain    HPI Elizabeth Maldonado is a 79 y.o. female.    Arm Pain  Patient is here for right arm pain.  Earlier today she was at the vet with her dog.  The leash was wrapped around her right arm.  The dog pulled on the least very hard, and she pain to the right arm/shoulder area.  Denies any fall on the arm/shoulder.  At this time she has severe pain to the right upper arm, shoulder and starting to have pain to the anterior chest wall on the right.  Holding/carrying her wallet was painful.   She is unable to move the arm due to pain. She has not taken anything for this.  She is allergic to tylenol .        Past Medical History:  Diagnosis Date   BRCA gene mutation negative in female    Breast cancer (HCC) 02/12/2020   Diverticulosis    Elevated blood pressure reading    165/71 a pre-op appt, reports she has seen her primary care in the last few months and intheir office it was in the 140s systolic, was not started on meds at that time, says her bp has been creeping up since all this stuff started , denies acute cardiac symptoms    Family history of breast cancer    Family history of colon cancer    Family history of melanoma    Hearing loss    wears hearing aids   History of anemia as a child    History of hiatal hernia    Hypercholesteremia    diet controlled   Hyperlipidemia 02/16/2016   Hypothyroid    Hypothyroidism 02/16/2016    Patient Active Problem List   Diagnosis Date Noted   History of left breast cancer 04/19/2020   Genetic testing 03/29/2020   Family history of breast cancer    Family history of melanoma    Family history of colon cancer    Malignant neoplasm of upper-outer quadrant of left breast in female, estrogen receptor positive (HCC) 03/09/2020   Family history of coronary artery disease  03/06/2019   Endometrial cancer (HCC) 07/11/2018   Obesity (BMI 35.0-39.9 without comorbidity) 07/11/2018   Endometrial polyp 06/11/2018   Post-menopausal bleeding 06/06/2018   Hyperlipidemia 02/16/2016   Hypothyroidism 02/16/2016   Plantar fasciitis of right foot 12/16/2015   Metatarsal deformity, right 12/16/2015   Pronation deformity of ankle, acquired 12/16/2015    Past Surgical History:  Procedure Laterality Date   ABDOMINAL HYSTERECTOMY     APPENDECTOMY     BUNIONECTOMY     CATARACT EXTRACTION     CATARACT EXTRACTION, BILATERAL     CESAREAN SECTION     CHOLECYSTECTOMY     DILATION AND CURETTAGE OF UTERUS     EYE SURGERY     FOOT SURGERY Right 2011   bone graft    HYSTEROSCOPY WITH D & C N/A 06/11/2018   Procedure: DILATATION AND CURETTAGE /HYSTEROSCOPY WITH MYOSURE POLYPECTOMY;  Surgeon: Corene Coy, MD;  Location: [facility];  Service: Gynecology;  Laterality: N/A;   MASS EXCISION Left 01/10/2021   Procedure: Excision of left excess mastectomy skin;  Surgeon: Elisabeth Craig RAMAN, MD;  Location: [facility];  Service: Plastics;  Laterality: Left;   MASTECTOMY W/  SENTINEL NODE BIOPSY Left 04/19/2020   Procedure: LEFT MASTECTOMY WITH SENTINEL LYMPH NODE BIOPSY;  Surgeon: Vernetta Berg, MD;  Location: [facility];  Service: General;  Laterality: Left;   NASAL SEPTUM SURGERY     at age 51   ROBOTIC ASSISTED TOTAL HYSTERECTOMY WITH BILATERAL SALPINGO OOPHERECTOMY Bilateral 07/17/2018   Procedure: XI ROBOTIC ASSISTED TOTAL HYSTERECTOMY WITH BILATERAL SALPINGO OOPHORECTOMY AND LEFT PELVIC LYMPADECTOMY;  Surgeon: Eloy Herring, MD;  Location: [facility];  Service: Gynecology;  Laterality: Bilateral;   SENTINEL NODE BIOPSY N/A 07/17/2018   Procedure: SENTINEL NODE BIOPSY;  Surgeon: Eloy Herring, MD;  Location: [facility];  Service: Gynecology;  Laterality: N/A;   TONSILLECTOMY     torn meniscus repair Right     OB History     Gravida  1   Para  1   Term  1   Preterm       AB      Living  1      SAB      IAB      Ectopic      Multiple      Live Births  1            Home Medications    Prior to Admission medications  Medication Sig Start Date End Date Taking? Authorizing Provider  b complex vitamins tablet Take 1 tablet by mouth daily with supper.   Yes [provider]  Calcium Citrate-Vitamin D (CALCIUM + D PO) Take 1 tablet by mouth daily.   Yes [provider]  Cholecalciferol (VITAMIN D) 125 MCG (5000 UT) CAPS Take 5,000 Units by mouth daily with supper.   Yes [provider]  Coenzyme Q10 (COQ10) 200 MG CAPS Take 200 mg by mouth daily with supper.   Yes [provider]  Evolocumab  (REPATHA  SURECLICK) 140 MG/ML SOAJ Inject 140 mg into the skin every 14 (fourteen) days. 08/21/23  Yes   ibuprofen  (ADVIL ) 400 MG tablet Take 400 mg by mouth every 6 (six) hours as needed.   Yes [provider]  levothyroxine  (SYNTHROID ) 75 MCG tablet Take 1 tablet (75 mcg total) by mouth daily. 08/25/23  Yes   Multiple Vitamin (MULTIVITAMIN WITH MINERALS) TABS tablet Take 1 tablet by mouth daily with supper.   Yes [provider]  amLODipine  (NORVASC ) 5 MG tablet Take 1 tablet (5 mg total) by mouth daily. 08/02/21     amoxicillin  (AMOXIL ) 500 MG capsule Take 1 capsule (500 mg total) by mouth 3 (three) times daily. 11/25/23   Lenor Hollering, MD  bisacodyl  5 MG EC tablet Take as directed. 11/01/22     Calcium Carb-Cholecalciferol 500-400 MG-UNIT TABS Take 1 tablet by mouth daily with supper.    [provider]  Evolocumab  (REPATHA  SURECLICK) 140 MG/ML SOAJ Inject 140 mg into the skin every 14 (fourteen) days. 02/20/22     gabapentin  (NEURONTIN ) 300 MG capsule Take 1 capsule by mouth every night at bedtime 05/26/20     levothyroxine  (SYNTHROID ) 75 MCG tablet Take 1 tablet (75 mcg total) by mouth daily. 03/18/22     levothyroxine  (SYNTHROID , LEVOTHROID) 75 MCG tablet Take 75 mcg by mouth daily before breakfast.     [provider]  methocarbamol  (ROBAXIN ) 750 MG tablet Take 1 tablet (750 mg total) by mouth in the morning and 1 tablet (750 mg total) at noon and 1 tablet (750 mg total) in the evening and 1 tablet (750 mg total) before bedtime. Do all this for 10  days. 11/29/21     polyethylene glycol-electrolytes (NULYTELY) 420 g solution Take as directed. 11/01/22     REPATHA  SURECLICK 140 MG/ML SOAJ Inject 140 mg into the skin every 14 (fourteen) days. 02/14/19   [provider]  tamoxifen  (NOLVADEX ) 20 MG tablet Take 1 tablet (20 mg total) by mouth daily. Start Feb 02, 2021 12/20/20   Magrinat, Sandria BROCKS, MD  Tetrahydrozoline HCl (VISINE OP) Place 1 drop into both eyes daily as needed (irritation/itchy eyes).    [provider]  Turmeric 500 MG CAPS Take by mouth.    [provider]    Family History Family History  Problem Relation Age of Onset   Hypertension Mother    Heart attack Mother    Heart attack Father    Lung cancer Father    Skin cancer Father    Diabetes Sister    Cervical cancer Sister    Skin cancer Sister    Diabetes Brother    CAD Brother    Lymphoma Brother    Skin cancer Brother    Diabetes Brother    Melanoma Brother    Melanoma Brother    Prostate cancer Brother    Diverticulitis Brother    Skin cancer Brother    Skin cancer Sister    Breast cancer Maternal Aunt 35   Colon cancer Paternal Grandmother    Lung cancer Maternal Aunt    Leukemia Cousin    Lung cancer Cousin     Social History Social History[1]   Allergies   Crestor [rosuvastatin], Lipitor [atorvastatin], Colestipol hcl, Acetaminophen , and Vytorin [ezetimibe-simvastatin]   Review of Systems Review of Systems  Constitutional: Negative.   HENT: Negative.    Respiratory: Negative.    Cardiovascular: Negative.   Gastrointestinal: Negative.   Genitourinary: Negative.      Physical Exam Triage Vital Signs ED Triage Vitals  Encounter Vitals Group     BP  01/31/24 1305 (!) 161/72     Girls Systolic BP Percentile --      Girls Diastolic BP Percentile --      Boys Systolic BP Percentile --      Boys Diastolic BP Percentile --      Pulse Rate 01/31/24 1305 73     Resp 01/31/24 1305 18     Temp 01/31/24 1305 98.5 F (36.9 C)     Temp Source 01/31/24 1305 Oral     SpO2 01/31/24 1305 96 %     Weight 01/31/24 1303 193 lb (87.5 kg)     Height 01/31/24 1303 5' 2 (1.575 m)     Head Circumference --      Peak Flow --      Pain Score 01/31/24 1303 7     Pain Loc --      Pain Education --      Exclude from Growth Chart --    No data found.  Updated Vital Signs BP (!) 161/72 (BP Location: Left Arm)   Pulse 73   Temp 98.5 F (36.9 C) (Oral)   Resp 18   Ht 5' 2 (1.575 m)   Wt 87.5 kg   LMP  (LMP Unknown)   SpO2 96%   BMI 35.30 kg/m   Visual Acuity Right Eye Distance:   Left Eye Distance:   Bilateral Distance:    Right Eye Near:   Left Eye Near:    Bilateral Near:     Physical Exam Constitutional:      General: She is  not in acute distress.    Appearance: Normal appearance. She is normal weight. She is not ill-appearing or toxic-appearing.  Musculoskeletal:     Comments: She holds the right arm straight at her side.  No TTP to the elbows to the fingers.  She has TTP to the right upper arm, shoulder, and right chest wall.   Full rom of the elbow without pain;  She has pain with small movement of the right shoulder in any direction;  decreased strength  Neurological:     Mental Status: She is alert.      UC Treatments / Results  Labs (all labs ordered are listed, but only abnormal results are displayed) Labs Reviewed - No data to display  EKG   Radiology No results found.  Procedures Procedures (including critical care time)  Medications Ordered in UC Medications  ibuprofen  (ADVIL ) tablet 600 mg (600 mg Oral Given 01/31/24 1317)    Initial Impression / Assessment and Plan / UC Course  I have reviewed the  triage vital signs and the nursing notes.  Pertinent labs & imaging results that were available during my care of the patient were reviewed by me and considered in my medical decision making (see chart for details).   Final Clinical Impressions(s) / UC Diagnoses   Final diagnoses:  Acute pain of right shoulder  Right arm pain     Discharge Instructions      You were seen today for arm and shoulder pain after an injury.  Your xray appears normal.  If the radiologist reads this differently we will notify you.  I have given you a sling for comfort.  Please continue motrin  for pain as needed, and use ice.  Once starting to feel better you may take your arm out and gently move the arm/shoulder to maintain movement.  If you continue with pain or not improving in several days you need to follow up with your primary care provider or an orthopedist.  You may call [facility] in [place] at [phone], or [facility] at [phone].     ED Prescriptions   None    PDMP not reviewed this encounter.    Darral Longs, MD 01/31/24 1411     [1]  Social History Tobacco Use   Smoking status: Never   Smokeless tobacco: Never  Vaping Use   Vaping status: Never Used  Substance Use Topics   Alcohol use: Not Currently    Comment: one to two beers a year   Drug use: No     Darral Longs, MD 01/31/24 1411  "

## 2024-01-31 NOTE — ED Triage Notes (Signed)
 Patient states that her 40lb dog jerked her right arm while holding onto the leash today.  Denies any OTC pain meds.
